# Patient Record
Sex: Female | Born: 1955
Health system: Southern US, Community
[De-identification: ages and names within clinical notes are randomized; demographics above are authoritative.]

## PROBLEM LIST (undated history)

## (undated) DIAGNOSIS — E119 Type 2 diabetes mellitus without complications: Secondary | ICD-10-CM

## (undated) DIAGNOSIS — I10 Essential (primary) hypertension: Secondary | ICD-10-CM

## (undated) DIAGNOSIS — I509 Heart failure, unspecified: Secondary | ICD-10-CM

## (undated) DIAGNOSIS — R011 Cardiac murmur, unspecified: Secondary | ICD-10-CM

## (undated) DIAGNOSIS — K5792 Diverticulitis of intestine, part unspecified, without perforation or abscess without bleeding: Secondary | ICD-10-CM

## (undated) DIAGNOSIS — I209 Angina pectoris, unspecified: Secondary | ICD-10-CM

## (undated) DIAGNOSIS — E785 Hyperlipidemia, unspecified: Secondary | ICD-10-CM

## (undated) DIAGNOSIS — E669 Obesity, unspecified: Secondary | ICD-10-CM

## (undated) DIAGNOSIS — J449 Chronic obstructive pulmonary disease, unspecified: Secondary | ICD-10-CM

## (undated) DIAGNOSIS — R0602 Shortness of breath: Secondary | ICD-10-CM

## (undated) DIAGNOSIS — I2510 Atherosclerotic heart disease of native coronary artery without angina pectoris: Secondary | ICD-10-CM

## (undated) DIAGNOSIS — J189 Pneumonia, unspecified organism: Secondary | ICD-10-CM

## (undated) HISTORY — PX: DILATION AND CURETTAGE OF UTERUS: SHX78

## (undated) HISTORY — PX: CORONARY ANGIOPLASTY WITH STENT PLACEMENT: SHX49

## (undated) HISTORY — PX: OPEN REDUCTION INTERNAL FIXATION (ORIF) TIBIA/FIBULA FRACTURE: SHX5992

## (undated) HISTORY — PX: DIAGNOSTIC LAPAROSCOPY: SUR761

## (undated) HISTORY — DX: Diverticulitis of intestine, part unspecified, without perforation or abscess without bleeding: K57.92

## (undated) HISTORY — PX: BOWEL RESECTION: SHX1257

## (undated) HISTORY — DX: Essential (primary) hypertension: I10

## (undated) HISTORY — PX: CHOLECYSTECTOMY: SHX55

## (undated) HISTORY — DX: Obesity, unspecified: E66.9

---

## 1978-04-27 HISTORY — PX: TUBAL LIGATION: SHX77

## 1988-12-26 HISTORY — PX: CHOLECYSTECTOMY: SHX55

## 1998-12-20 ENCOUNTER — Encounter: Payer: Self-pay | Admitting: Surgery

## 1998-12-26 ENCOUNTER — Observation Stay (HOSPITAL_COMMUNITY): Admission: RE | Admit: 1998-12-26 | Discharge: 1998-12-26 | Payer: Self-pay | Admitting: Surgery

## 1998-12-26 ENCOUNTER — Encounter (INDEPENDENT_AMBULATORY_CARE_PROVIDER_SITE_OTHER): Payer: Self-pay | Admitting: Specialist

## 2002-07-10 ENCOUNTER — Encounter: Payer: Self-pay | Admitting: Infectious Diseases

## 2002-07-10 ENCOUNTER — Ambulatory Visit (HOSPITAL_COMMUNITY): Admission: RE | Admit: 2002-07-10 | Discharge: 2002-07-10 | Payer: Self-pay | Admitting: Infectious Diseases

## 2003-04-06 ENCOUNTER — Emergency Department (HOSPITAL_COMMUNITY): Admission: EM | Admit: 2003-04-06 | Discharge: 2003-04-06 | Payer: Self-pay | Admitting: Emergency Medicine

## 2004-02-18 ENCOUNTER — Other Ambulatory Visit: Admission: RE | Admit: 2004-02-18 | Discharge: 2004-02-18 | Payer: Self-pay | Admitting: Obstetrics and Gynecology

## 2004-02-19 ENCOUNTER — Encounter: Admission: RE | Admit: 2004-02-19 | Discharge: 2004-02-19 | Payer: Self-pay | Admitting: Family Medicine

## 2004-09-23 ENCOUNTER — Encounter: Admission: RE | Admit: 2004-09-23 | Discharge: 2004-12-22 | Payer: Self-pay | Admitting: Family Medicine

## 2005-07-23 ENCOUNTER — Ambulatory Visit: Payer: Self-pay | Admitting: Physical Medicine & Rehabilitation

## 2005-07-23 ENCOUNTER — Inpatient Hospital Stay (HOSPITAL_COMMUNITY): Admission: AC | Admit: 2005-07-23 | Discharge: 2005-07-28 | Payer: Self-pay

## 2007-04-16 ENCOUNTER — Emergency Department (HOSPITAL_COMMUNITY): Admission: EM | Admit: 2007-04-16 | Discharge: 2007-04-16 | Payer: Self-pay | Admitting: Emergency Medicine

## 2008-02-21 ENCOUNTER — Encounter (INDEPENDENT_AMBULATORY_CARE_PROVIDER_SITE_OTHER): Payer: Self-pay | Admitting: Gastroenterology

## 2008-02-21 ENCOUNTER — Ambulatory Visit (HOSPITAL_COMMUNITY): Admission: RE | Admit: 2008-02-21 | Discharge: 2008-02-21 | Payer: Self-pay | Admitting: Gastroenterology

## 2008-04-27 HISTORY — PX: LAPAROSCOPIC GASTRIC BANDING: SHX1100

## 2009-07-17 ENCOUNTER — Other Ambulatory Visit: Admission: RE | Admit: 2009-07-17 | Discharge: 2009-07-17 | Payer: Self-pay | Admitting: Family Medicine

## 2009-07-22 ENCOUNTER — Encounter: Admission: RE | Admit: 2009-07-22 | Discharge: 2009-07-22 | Payer: Self-pay | Admitting: Family Medicine

## 2009-07-24 ENCOUNTER — Ambulatory Visit (HOSPITAL_COMMUNITY): Admission: RE | Admit: 2009-07-24 | Discharge: 2009-07-24 | Payer: Self-pay | Admitting: Surgery

## 2009-08-05 ENCOUNTER — Ambulatory Visit (HOSPITAL_COMMUNITY): Admission: RE | Admit: 2009-08-05 | Discharge: 2009-08-05 | Payer: Self-pay | Admitting: Surgery

## 2009-08-14 ENCOUNTER — Emergency Department (HOSPITAL_COMMUNITY): Admission: EM | Admit: 2009-08-14 | Discharge: 2009-08-14 | Payer: Self-pay | Admitting: Family Medicine

## 2009-09-10 ENCOUNTER — Encounter: Admission: RE | Admit: 2009-09-10 | Discharge: 2009-10-17 | Payer: Self-pay | Admitting: Surgery

## 2009-11-05 ENCOUNTER — Ambulatory Visit (HOSPITAL_COMMUNITY): Admission: RE | Admit: 2009-11-05 | Discharge: 2009-11-06 | Payer: Self-pay | Admitting: Surgery

## 2009-11-19 ENCOUNTER — Encounter: Admission: RE | Admit: 2009-11-19 | Discharge: 2010-01-23 | Payer: Self-pay | Admitting: Surgery

## 2010-01-16 ENCOUNTER — Encounter: Admission: RE | Admit: 2010-01-16 | Discharge: 2010-01-23 | Payer: Self-pay | Admitting: Surgery

## 2010-03-03 ENCOUNTER — Encounter
Admission: RE | Admit: 2010-03-03 | Discharge: 2010-03-03 | Payer: Self-pay | Source: Home / Self Care | Attending: Surgery | Admitting: Surgery

## 2010-06-03 ENCOUNTER — Encounter: Admit: 2010-06-03 | Payer: Self-pay | Admitting: Surgery

## 2010-06-03 ENCOUNTER — Encounter: Payer: Commercial Managed Care - PPO | Attending: Surgery | Admitting: *Deleted

## 2010-06-03 DIAGNOSIS — Z9884 Bariatric surgery status: Secondary | ICD-10-CM | POA: Insufficient documentation

## 2010-06-03 DIAGNOSIS — Z713 Dietary counseling and surveillance: Secondary | ICD-10-CM | POA: Insufficient documentation

## 2010-06-03 DIAGNOSIS — Z09 Encounter for follow-up examination after completed treatment for conditions other than malignant neoplasm: Secondary | ICD-10-CM | POA: Insufficient documentation

## 2010-07-13 LAB — GLUCOSE, CAPILLARY: Glucose-Capillary: 153 mg/dL — ABNORMAL HIGH (ref 70–99)

## 2010-07-13 LAB — COMPREHENSIVE METABOLIC PANEL
AST: 26 U/L (ref 0–37)
Alkaline Phosphatase: 91 U/L (ref 39–117)
Calcium: 9.6 mg/dL (ref 8.4–10.5)
Chloride: 100 mEq/L (ref 96–112)
GFR calc non Af Amer: >60 mL/min (ref >60)
Glucose, Bld: 134 mg/dL — ABNORMAL HIGH (ref 70–99)
Total Bilirubin: 0.5 mg/dL (ref 0.3–1.2)
Total Protein: 7.4 g/dL (ref 6.0–8.3)

## 2010-07-13 LAB — DIFFERENTIAL
Basophils Relative: 0 % (ref 0–1)
Basophils Relative: 1 % (ref 0–1)
Eosinophils Absolute: 0.2 10*3/uL (ref 0.0–0.7)
Lymphocytes Relative: 29 % (ref 12–46)
Lymphs Abs: 1.8 10*3/uL (ref 0.7–4.0)
Monocytes Absolute: 0.7 10*3/uL (ref 0.1–1.0)
Monocytes Absolute: 0.8 10*3/uL (ref 0.1–1.0)
Monocytes Relative: 5 % (ref 3–12)
Monocytes Relative: 7 % (ref 3–12)
Neutro Abs: 11.9 10*3/uL — ABNORMAL HIGH (ref 1.7–7.7)
Neutrophils Relative %: 82 % — ABNORMAL HIGH (ref 43–77)

## 2010-07-13 LAB — CBC
HCT: 37.7 % (ref 36.0–46.0)
Hemoglobin: 12.8 g/dL (ref 12.0–15.0)
RBC: 4.34 MIL/uL (ref 3.87–5.11)
RDW: 15.1 % (ref 11.5–15.5)
WBC: 10.3 10*3/uL (ref 4.0–10.5)

## 2010-07-13 LAB — SURGICAL PCR SCREEN: MRSA, PCR: NEGATIVE

## 2010-09-09 NOTE — Op Note (Signed)
Karina Oneal, Karina Oneal                  ACCOUNT NO.:  [id number]   MEDICAL RECORD NO.:  [id number]          PATIENT TYPE:  AMB   LOCATION:  ENDO                         FACILITY:  [facility]   PHYSICIAN:  Shirley Friar, MDDATE OF BIRTH:  20-Apr-1956   DATE OF PROCEDURE:  02/21/2008  DATE OF DISCHARGE:                               OPERATIVE REPORT   PROCEDURE:  Colonoscopy.   REASON FOR COLONOSCOPY:  Screening.   MEDICATIONS:  Phenergan 12.5 mg IV, fentanyl 75 mcg IV, Versed 6 mg IV.   FINDINGS:  Rectal exam was normal.  A pediatric Pentax colonoscope was  inserted to a fair prepped colon, advanced to the cecum where ileocecal  valve and appendiceal orifice were identified.  On careful withdrawal of  the colonoscope three small sessile polyps were seen.  Two polyps were  seen in transverse colon and one of these polyps was removed with snare  cautery.  It measured about for 4 mm in diameter.  A 2 mm diminutive  polyp was removed with cold snare technique and this diminutive polyp  was unable to be retrieved.  In the sigmoid colon a 3-mm sessile polyp  was removed with snare cautery.  There was scattered large and small  diverticula seen in the descending colon and sigmoid colon.  Retroflexion was unremarkable.   IMPRESSION:  1. Three small polyps status post snare removal (one removed with cold      technique, other two removed with hot technique - details stated      above) - diminutive polyp unable to be retrieved.  2. Left-sided diverticulosis.   PLAN:  1. Follow-up on path.  2. Avoid aspirin products x2 weeks.  3. High-fiber diet.      Shirley Friar, MD  Electronically Signed     VCS/MEDQ  D:  02/21/2008  T:  02/21/2008  Job:  [id number]   cc:   C. Duane Lope, M.D.  Fax: [phone]
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
## 2010-10-17 ENCOUNTER — Encounter (INDEPENDENT_AMBULATORY_CARE_PROVIDER_SITE_OTHER): Payer: Self-pay | Admitting: Surgery

## 2010-11-23 ENCOUNTER — Emergency Department (HOSPITAL_COMMUNITY)
Admission: EM | Admit: 2010-11-23 | Discharge: 2010-11-23 | Disposition: A | Discharge status: Home / Self Care | Payer: Commercial Managed Care - PPO | Attending: Emergency Medicine | Admitting: Emergency Medicine

## 2010-11-23 ENCOUNTER — Emergency Department (HOSPITAL_COMMUNITY): Payer: Commercial Managed Care - PPO

## 2010-11-23 DIAGNOSIS — Z9884 Bariatric surgery status: Secondary | ICD-10-CM | POA: Insufficient documentation

## 2010-11-23 DIAGNOSIS — R109 Unspecified abdominal pain: Secondary | ICD-10-CM | POA: Insufficient documentation

## 2010-11-23 DIAGNOSIS — E785 Hyperlipidemia, unspecified: Secondary | ICD-10-CM | POA: Insufficient documentation

## 2010-11-23 DIAGNOSIS — R197 Diarrhea, unspecified: Secondary | ICD-10-CM | POA: Insufficient documentation

## 2010-11-23 DIAGNOSIS — R110 Nausea: Secondary | ICD-10-CM | POA: Insufficient documentation

## 2010-11-23 DIAGNOSIS — Z9089 Acquired absence of other organs: Secondary | ICD-10-CM | POA: Insufficient documentation

## 2010-11-23 DIAGNOSIS — K5732 Diverticulitis of large intestine without perforation or abscess without bleeding: Secondary | ICD-10-CM | POA: Insufficient documentation

## 2010-11-23 DIAGNOSIS — E669 Obesity, unspecified: Secondary | ICD-10-CM | POA: Insufficient documentation

## 2010-11-23 DIAGNOSIS — K625 Hemorrhage of anus and rectum: Secondary | ICD-10-CM | POA: Insufficient documentation

## 2010-11-23 DIAGNOSIS — I10 Essential (primary) hypertension: Secondary | ICD-10-CM | POA: Insufficient documentation

## 2010-11-23 LAB — CBC
HCT: 46.7 % — ABNORMAL HIGH (ref 36.0–46.0)
Hemoglobin: 16.7 g/dL — ABNORMAL HIGH (ref 12.0–15.0)
MCH: 31.3 pg (ref 26.0–34.0)
MCV: 87.5 fL (ref 78.0–100.0)
RBC: 5.34 MIL/uL — ABNORMAL HIGH (ref 3.87–5.11)
WBC: 19.1 10*3/uL — ABNORMAL HIGH (ref 4.0–10.5)

## 2010-11-23 LAB — OCCULT BLOOD, POC DEVICE: Fecal Occult Bld: POSITIVE

## 2010-11-23 LAB — DIFFERENTIAL
Basophils Absolute: 0.1 10*3/uL (ref 0.0–0.1)
Basophils Relative: 0 % (ref 0–1)
Eosinophils Absolute: 0.1 10*3/uL (ref 0.0–0.7)
Eosinophils Relative: 1 % (ref 0–5)
Lymphocytes Relative: 13 % (ref 12–46)
Lymphs Abs: 2.5 10*3/uL (ref 0.7–4.0)
Monocytes Absolute: 1 10*3/uL (ref 0.1–1.0)
Monocytes Relative: 5 % (ref 3–12)
Neutro Abs: 15.5 10*3/uL — ABNORMAL HIGH (ref 1.7–7.7)
Neutrophils Relative %: 81 % — ABNORMAL HIGH (ref 43–77)

## 2010-11-23 LAB — COMPREHENSIVE METABOLIC PANEL WITH GFR
ALT: 35 U/L (ref 0–35)
AST: 18 U/L (ref 0–37)
Albumin: 3.8 g/dL (ref 3.5–5.2)
Alkaline Phosphatase: 154 U/L — ABNORMAL HIGH (ref 39–117)
BUN: 9 mg/dL (ref 6–23)
CO2: 27 meq/L (ref 19–32)
Calcium: 9.7 mg/dL (ref 8.4–10.5)
Chloride: 99 meq/L (ref 96–112)
Creatinine, Ser: 0.59 mg/dL (ref 0.50–1.10)
GFR calc Af Amer: >60 mL/min
GFR calc non Af Amer: >60 mL/min
Glucose, Bld: 233 mg/dL — ABNORMAL HIGH (ref 70–99)
Potassium: 3.2 meq/L — ABNORMAL LOW (ref 3.5–5.1)
Sodium: 139 meq/L (ref 135–145)
Total Bilirubin: 0.5 mg/dL (ref 0.3–1.2)
Total Protein: 7.5 g/dL (ref 6.0–8.3)

## 2010-11-23 MED ORDER — IOHEXOL 300 MG/ML  SOLN
100.0000 mL | Freq: Once | INTRAMUSCULAR | Status: AC | PRN
  Administered 2010-11-23: 100 mL via INTRAVENOUS

## 2010-12-05 ENCOUNTER — Encounter (INDEPENDENT_AMBULATORY_CARE_PROVIDER_SITE_OTHER): Payer: Self-pay

## 2011-01-30 LAB — I-STAT 8, (EC8 V) (CONVERTED LAB)
BUN: 7
Chloride: 102
HCT: 47 — ABNORMAL HIGH
Operator id: 196461
Potassium: 4
pCO2, Ven: 52.7 — ABNORMAL HIGH
pH, Ven: 7.398 — ABNORMAL HIGH

## 2011-01-30 LAB — POCT I-STAT CREATININE
Creatinine, Ser: 0.6
Operator id: 196461

## 2011-01-30 LAB — POCT CARDIAC MARKERS: CKMB, poc: <1 — ABNORMAL LOW

## 2011-05-07 ENCOUNTER — Encounter: Payer: Self-pay | Admitting: Internal Medicine

## 2011-05-08 ENCOUNTER — Ambulatory Visit (INDEPENDENT_AMBULATORY_CARE_PROVIDER_SITE_OTHER): Payer: Commercial Managed Care - PPO | Admitting: Internal Medicine

## 2011-05-08 ENCOUNTER — Encounter: Payer: Self-pay | Admitting: Internal Medicine

## 2011-05-08 DIAGNOSIS — E669 Obesity, unspecified: Secondary | ICD-10-CM

## 2011-05-08 DIAGNOSIS — Z720 Tobacco use: Secondary | ICD-10-CM

## 2011-05-08 DIAGNOSIS — I10 Essential (primary) hypertension: Secondary | ICD-10-CM

## 2011-05-08 DIAGNOSIS — R079 Chest pain, unspecified: Secondary | ICD-10-CM

## 2011-05-08 DIAGNOSIS — F17200 Nicotine dependence, unspecified, uncomplicated: Secondary | ICD-10-CM

## 2011-05-08 DIAGNOSIS — E785 Hyperlipidemia, unspecified: Secondary | ICD-10-CM

## 2011-05-08 NOTE — Progress Notes (Signed)
HPI Patient is a 56 yr old who was referred for evaluation of CP. Patinet has no history of CAD.  Does have a history of DM, HTN, dyslipidemia.  Seen by Dr. Tenny Craw on 1/9. Patient notes in November development of   neck, both arms.  Occurs with and without activity.  Multiple times per day.   Seems to come on with activity, stressed. Also has constant SSCP. No episodes of PND> Does have when lays down at night. Has lap band.  No reflux. Mother with CABG at 56 Has been borderline for DM.  Lap band AUgust 2011.  Had been on cholesterol lowering medicne.  Stopped when she developed blood in stool  Heard it had ground glass in it  Smokes 1/2 to 1 ppd. Quit in the past on Chantix.  Restarted.  Plans to go to counselling session.  Allergies  Allergen Reactions  . Ciprocin-Fluocin-Procin (Fluocinolone Acetonide)   . Sulfa Antibiotics     Current Outpatient Prescriptions  Medication Sig Dispense Refill  . amLODipine-benazepril (LOTREL) 5-20 MG per capsule Take 1 capsule by mouth daily.        Marland Kitchen aspirin 81 MG EC tablet Take 81 mg by mouth daily.        Marland Kitchen atenolol (TENORMIN) 100 MG tablet Take 100 mg by mouth daily.        . hydrochlorothiazide 25 MG tablet Take 25 mg by mouth daily.        . nitroGLYCERIN (NITROSTAT) 0.4 MG SL tablet Place 0.4 mg under the tongue every 5 (five) minutes as needed.      . sertraline (ZOLOFT) 50 MG tablet Take 50 mg by mouth daily.        Past Medical History  Diagnosis Date  . Wears glasses   . Diabetes mellitus, undetermined   . Hypertension     Past Surgical History  Procedure Date  . Cholecystectomy   . Tubal ligation   . Foot surgery     Family History  Problem Relation Age of Onset  . Asthma Father     History   Social History  . Marital Status: Divorced    Spouse Name: N/A    Number of Children: N/A  . Years of Education: N/A   Occupational History  . Not on file.   Social History Main Topics  . Smoking status: Current Everyday  Smoker    Types: Cigarettes  . Smokeless tobacco: Not on file  . Alcohol Use: Not on file  . Drug Use: Not on file  . Sexually Active: Not on file   Other Topics Concern  . Not on file   Social History Narrative  . No narrative on file    Review of Systems:  All systems reviewed.  They are negative to the above problem except as previously stated.  Vital Signs: BP 150/79  Pulse 56  Ht 5\' 5"  (1.651 m)  Wt 230 lb (104.327 kg)  BMI 38.27 kg/m2  Physical Exam Patient is in NAD.  HEENT:  Normocephalic, atraumatic. EOMI, PERRLA.  Neck: JVP is normal. No thyromegaly. No bruits.  Lungs: Some decreased airflow with mild wheeze.Marland Kitchen  Heart: Regular rate and rhythm. Normal S1, S2. No S3.   No significant murmurs. PMI not displaced.  Abdomen:  Supple, nontender. Normal bowel sounds. No masses. No hepatomegaly.Port for lap band on R.  Extremities:   Good distal pulses throughout. No lower extremity edema.  Musculoskeletal :moving all extremities.  Neuro:   alert and oriented  x3.  CN II-XII grossly intact.  EKG:  Sinus bradycardia.  56 bpm.  T wave inversion III   Assessment and Plan:

## 2011-05-08 NOTE — Assessment & Plan Note (Signed)
Patient has a constant sscp  I am not convinced this is cardiac. The intermitt neck and arm discomfort may represent angina but I am not completely convinced. With risks I would recomm a stress myoview to evaluate.

## 2011-05-08 NOTE — Assessment & Plan Note (Signed)
BP a little high.  I would not switch meds for now.  Will need to be followed.

## 2011-05-08 NOTE — Assessment & Plan Note (Signed)
COunselled on diet.

## 2011-05-08 NOTE — Assessment & Plan Note (Signed)
Will get records from Dr. Tenny Craw.  She should be on a statin.

## 2011-05-08 NOTE — Patient Instructions (Signed)
Your physician has requested that you have en exercise stress myoview. For further information please visit [email or web address]. Please follow instruction sheet, as given.   

## 2011-05-08 NOTE — Assessment & Plan Note (Signed)
Counselled.  Patient has quit in the past.  Exam with evidence of some COPD.

## 2011-05-20 ENCOUNTER — Ambulatory Visit (HOSPITAL_COMMUNITY): Payer: 59 | Attending: Cardiology | Admitting: Radiology

## 2011-05-20 DIAGNOSIS — E785 Hyperlipidemia, unspecified: Secondary | ICD-10-CM | POA: Insufficient documentation

## 2011-05-20 DIAGNOSIS — F17200 Nicotine dependence, unspecified, uncomplicated: Secondary | ICD-10-CM | POA: Insufficient documentation

## 2011-05-20 DIAGNOSIS — Z8249 Family history of ischemic heart disease and other diseases of the circulatory system: Secondary | ICD-10-CM | POA: Insufficient documentation

## 2011-05-20 DIAGNOSIS — R079 Chest pain, unspecified: Secondary | ICD-10-CM | POA: Insufficient documentation

## 2011-05-20 DIAGNOSIS — R5381 Other malaise: Secondary | ICD-10-CM | POA: Insufficient documentation

## 2011-05-20 DIAGNOSIS — E119 Type 2 diabetes mellitus without complications: Secondary | ICD-10-CM | POA: Insufficient documentation

## 2011-05-20 DIAGNOSIS — E669 Obesity, unspecified: Secondary | ICD-10-CM | POA: Insufficient documentation

## 2011-05-20 DIAGNOSIS — I10 Essential (primary) hypertension: Secondary | ICD-10-CM | POA: Insufficient documentation

## 2011-05-20 DIAGNOSIS — R000 Tachycardia, unspecified: Secondary | ICD-10-CM | POA: Insufficient documentation

## 2011-05-20 DIAGNOSIS — R0602 Shortness of breath: Secondary | ICD-10-CM | POA: Insufficient documentation

## 2011-05-20 MED ORDER — TECHNETIUM TC 99M TETROFOSMIN IV KIT
30.0000 | PACK | Freq: Once | INTRAVENOUS | Status: AC | PRN
  Administered 2011-05-20: 30 via INTRAVENOUS

## 2011-05-20 MED ORDER — REGADENOSON 0.4 MG/5ML IV SOLN
0.4000 mg | Freq: Once | INTRAVENOUS | Status: AC
  Administered 2011-05-20: 0.4 mg via INTRAVENOUS

## 2011-05-20 MED ORDER — TECHNETIUM TC 99M TETROFOSMIN IV KIT
10.0000 | PACK | Freq: Once | INTRAVENOUS | Status: AC | PRN
  Administered 2011-05-20: 10 via INTRAVENOUS

## 2011-05-20 NOTE — Progress Notes (Signed)
[facility] SITE 3 NUCLEAR MED [address] [place] [address] [phone]  Cardiology Nuclear Med Study  Karina Oneal is a 56 y.o. female [id number] 1955-12-14   Nuclear Med Background Indication for Stress Test:  Evaluation for Ischemia History:  No previous documented CAD Cardiac Risk Factors: Family History - CAD, Hypertension, Lipids, NIDDM, Obesity and Smoker  Symptoms:  Chest Pressure with and without Exertion (last episode of chest discomfort was this am in the parking lot, 6/10, none now), Fatigue, Rapid HR and SOB   Nuclear Pre-Procedure Caffeine/Decaff Intake:  7:00pm NPO After: 1:00am   Lungs:  Clear.  O2 SAT 95% on RA IV 0.9% NS with Angio Cath:  22g  IV Site: R Forearm x 1, tolerated well IV Started by:  Irean Hong, RN  Chest Size (in):  42 Cup Size: D  Height: 5\' 5"  (1.651 m)  Weight:  227 lb (102.967 kg)  BMI:  Body mass index is 37.77 kg/(m^2). Tech Comments:  Atenolol held x 24 hours    Nuclear Med Study 1 or 2 day study: 1 day  Stress Test Type:  Treadmill/Lexiscan  Reading MD: Marca Ancona, MD  Order Authorizing Provider:  Dietrich Pates, MD  Resting Radionuclide: Technetium 1m Tetrofosmin  Resting Radionuclide Dose: 11.0 mCi   Stress Radionuclide:  Technetium 65m Tetrofosmin  Stress Radionuclide Dose: 33.0 mCi           Stress Protocol Rest HR: 51 Stress HR: 96  Rest BP: Sitting 127/62  Standing 129/63 Stress BP: 172/75  Exercise Time (min): 2:00 METS: n/a   Predicted Max HR: 166 bpm % Max HR: 57.83 bpm Rate Pressure Product: 52841   Dose of Adenosine (mg):  n/a Dose of Lexiscan: 0.4 mg  Dose of Atropine (mg): n/a Dose of Dobutamine: n/a mcg/kg/min (at max HR)  Stress Test Technologist: Smiley Houseman, CMA-N  Nuclear Technologist:  Domenic Polite, CNMT     Rest Procedure:  Myocardial perfusion imaging was performed at rest 45 minutes following the intravenous administration of Technetium 58m Tetrofosmin.  Rest ECG:  Nonspecific ST-T wave changes.  Stress Procedure:  The patient received IV Lexiscan 0.4 mg over 15-seconds with concurrent low level exercise and then Technetium 63m Tetrofosmin was injected at 30-seconds while the patient continued walking one more minute.  There were no significant changes with Lexiscan.  She did c/o chest pain with Lexiscan.  Quantitative spect images were obtained after a 45-minute delay.    Patient had a mild hypertensive response in recovery due to BP cuff pain, per patient.  Stress ECG: No significant change from baseline ECG  QPS Raw Data Images:  Normal; no motion artifact; normal heart/lung ratio. Stress Images:  Basal to mid inferior perfusion defect, moderate in intensity.  Rest Images:  Normal homogeneous uptake in all areas of the myocardium. Subtraction (SDS):  Reversible basal to mid inferior perfusion defect.  Transient Ischemic Dilatation (Normal <1.22):  1.04 Lung/Heart Ratio (Normal <0.45):  0.35  Quantitative Gated Spect Images QGS EDV:  96 ml QGS ESV:  33 ml QGS cine images:  NL LV Function; NL Wall Motion QGS EF: 66%  Impression Exercise Capacity:  Lexiscan with low level exercise. BP Response:  Hypertensive blood pressure response. Clinical Symptoms:  Chest pain.  ECG Impression:  No significant ST segment change suggestive of ischemia. Comparison with Prior Nuclear Study: No previous nuclear study performed  Overall Impression:  There is a reversible basal to mid inferior perfusion defect with normal  wall motion and EF.  This does suggest ischemia.   Declyn Offield [employer]

## 2011-05-22 ENCOUNTER — Other Ambulatory Visit: Payer: Self-pay | Admitting: *Deleted

## 2011-05-22 DIAGNOSIS — R9439 Abnormal result of other cardiovascular function study: Secondary | ICD-10-CM

## 2011-05-25 ENCOUNTER — Other Ambulatory Visit (INDEPENDENT_AMBULATORY_CARE_PROVIDER_SITE_OTHER): Payer: Commercial Managed Care - PPO | Admitting: *Deleted

## 2011-05-25 DIAGNOSIS — R9439 Abnormal result of other cardiovascular function study: Secondary | ICD-10-CM

## 2011-05-25 DIAGNOSIS — R079 Chest pain, unspecified: Secondary | ICD-10-CM

## 2011-05-25 LAB — BASIC METABOLIC PANEL
CO2: 29 mEq/L (ref 19–32)
Chloride: 101 mEq/L (ref 96–112)
Glucose, Bld: 179 mg/dL — ABNORMAL HIGH (ref 70–99)
Potassium: 3.6 mEq/L (ref 3.5–5.1)
Sodium: 139 mEq/L (ref 135–145)

## 2011-05-25 LAB — CBC WITH DIFFERENTIAL/PLATELET
Basophils Relative: 0.4 % (ref 0.0–3.0)
Eosinophils Relative: 2 % (ref 0.0–5.0)
HCT: 42.7 % (ref 36.0–46.0)
Hemoglobin: 14.4 g/dL (ref 12.0–15.0)
Lymphs Abs: 2.5 10*3/uL (ref 0.7–4.0)
MCV: 91.5 fl (ref 78.0–100.0)
Monocytes Absolute: 0.7 10*3/uL (ref 0.1–1.0)
RBC: 4.67 Mil/uL (ref 3.87–5.11)
WBC: 12.4 10*3/uL — ABNORMAL HIGH (ref 4.5–10.5)

## 2011-05-26 ENCOUNTER — Encounter (HOSPITAL_BASED_OUTPATIENT_CLINIC_OR_DEPARTMENT_OTHER)
Admission: RE | Disposition: A | Discharge status: Hospice | Payer: Self-pay | Source: Ambulatory Visit | Attending: Cardiology

## 2011-05-26 ENCOUNTER — Other Ambulatory Visit: Payer: Self-pay

## 2011-05-26 ENCOUNTER — Inpatient Hospital Stay (HOSPITAL_BASED_OUTPATIENT_CLINIC_OR_DEPARTMENT_OTHER)
Admission: RE | Admit: 2011-05-26 | Discharge: 2011-05-26 | Disposition: A | Discharge status: Hospice | Payer: 59 | Source: Ambulatory Visit | Attending: Cardiology | Admitting: Cardiology

## 2011-05-26 ENCOUNTER — Encounter (HOSPITAL_COMMUNITY)
Admission: RE | Disposition: A | Discharge status: Home / Self Care | Payer: Self-pay | Source: Ambulatory Visit | Attending: Cardiology

## 2011-05-26 ENCOUNTER — Ambulatory Visit (HOSPITAL_COMMUNITY)
Admission: RE | Admit: 2011-05-26 | Discharge: 2011-05-27 | Disposition: A | Discharge status: Home / Self Care | Payer: 59 | Source: Ambulatory Visit | Attending: Cardiology | Admitting: Cardiology

## 2011-05-26 ENCOUNTER — Encounter (HOSPITAL_COMMUNITY): Payer: Self-pay | Admitting: Cardiology

## 2011-05-26 DIAGNOSIS — I2510 Atherosclerotic heart disease of native coronary artery without angina pectoris: Secondary | ICD-10-CM | POA: Insufficient documentation

## 2011-05-26 DIAGNOSIS — F17200 Nicotine dependence, unspecified, uncomplicated: Secondary | ICD-10-CM | POA: Insufficient documentation

## 2011-05-26 DIAGNOSIS — E785 Hyperlipidemia, unspecified: Secondary | ICD-10-CM | POA: Insufficient documentation

## 2011-05-26 DIAGNOSIS — E119 Type 2 diabetes mellitus without complications: Secondary | ICD-10-CM | POA: Insufficient documentation

## 2011-05-26 DIAGNOSIS — Z9884 Bariatric surgery status: Secondary | ICD-10-CM | POA: Insufficient documentation

## 2011-05-26 DIAGNOSIS — Z79899 Other long term (current) drug therapy: Secondary | ICD-10-CM | POA: Insufficient documentation

## 2011-05-26 DIAGNOSIS — Z7982 Long term (current) use of aspirin: Secondary | ICD-10-CM | POA: Insufficient documentation

## 2011-05-26 DIAGNOSIS — I10 Essential (primary) hypertension: Secondary | ICD-10-CM | POA: Insufficient documentation

## 2011-05-26 DIAGNOSIS — R9439 Abnormal result of other cardiovascular function study: Secondary | ICD-10-CM | POA: Insufficient documentation

## 2011-05-26 DIAGNOSIS — R079 Chest pain, unspecified: Secondary | ICD-10-CM | POA: Insufficient documentation

## 2011-05-26 HISTORY — DX: Cardiac murmur, unspecified: R01.1

## 2011-05-26 HISTORY — DX: Shortness of breath: R06.02

## 2011-05-26 HISTORY — DX: Atherosclerotic heart disease of native coronary artery without angina pectoris: I25.10

## 2011-05-26 HISTORY — DX: Hyperlipidemia, unspecified: E78.5

## 2011-05-26 HISTORY — PX: PERCUTANEOUS CORONARY STENT INTERVENTION (PCI-S): SHX5485

## 2011-05-26 LAB — POCT I-STAT GLUCOSE: Glucose, Bld: 133 mg/dL — ABNORMAL HIGH (ref 70–99)

## 2011-05-26 SURGERY — JV LEFT HEART CATHETERIZATION WITH CORONARY ANGIOGRAM
Anesthesia: Moderate Sedation

## 2011-05-26 SURGERY — PERCUTANEOUS CORONARY STENT INTERVENTION (PCI-S)
Anesthesia: LOCAL

## 2011-05-26 MED ORDER — ATENOLOL 100 MG PO TABS
100.0000 mg | ORAL_TABLET | Freq: Every day | ORAL | Status: DC
  Filled 2011-05-26: qty 1

## 2011-05-26 MED ORDER — SODIUM CHLORIDE 0.9 % IV SOLN: 250.0000 mL | INTRAVENOUS | Status: DC | PRN

## 2011-05-26 MED ORDER — BENAZEPRIL HCL 20 MG PO TABS
20.0000 mg | ORAL_TABLET | Freq: Every day | ORAL | Status: DC
  Filled 2011-05-26: qty 1

## 2011-05-26 MED ORDER — SODIUM CHLORIDE 0.9 % IJ SOLN: 3.0000 mL | INTRAMUSCULAR | Status: DC | PRN

## 2011-05-26 MED ORDER — BIVALIRUDIN 250 MG IV SOLR
INTRAVENOUS | Status: AC
  Filled 2011-05-26: qty 250

## 2011-05-26 MED ORDER — AMLODIPINE BESYLATE 5 MG PO TABS
5.0000 mg | ORAL_TABLET | Freq: Every day | ORAL | Status: DC
  Filled 2011-05-26: qty 1

## 2011-05-26 MED ORDER — FENTANYL CITRATE 0.05 MG/ML IJ SOLN
INTRAMUSCULAR | Status: AC
  Filled 2011-05-26: qty 2

## 2011-05-26 MED ORDER — ASPIRIN 81 MG PO CHEW: 81.0000 mg | CHEWABLE_TABLET | Freq: Every day | ORAL | Status: DC

## 2011-05-26 MED ORDER — HEPARIN BOLUS VIA INFUSION
3000.0000 [IU] | Freq: Once | INTRAVENOUS | Status: AC
  Administered 2011-05-26: 3000 [IU] via INTRAVENOUS

## 2011-05-26 MED ORDER — HEPARIN (PORCINE) IN NACL 2-0.9 UNIT/ML-% IJ SOLN
INTRAMUSCULAR | Status: AC
  Filled 2011-05-26: qty 2000

## 2011-05-26 MED ORDER — NITROGLYCERIN 0.4 MG SL SUBL: 0.4000 mg | SUBLINGUAL_TABLET | SUBLINGUAL | Status: DC | PRN

## 2011-05-26 MED ORDER — MIDAZOLAM HCL 2 MG/2ML IJ SOLN
INTRAMUSCULAR | Status: AC
  Filled 2011-05-26: qty 2

## 2011-05-26 MED ORDER — HYDROCHLOROTHIAZIDE 25 MG PO TABS
25.0000 mg | ORAL_TABLET | Freq: Every day | ORAL | Status: DC
  Filled 2011-05-26: qty 1

## 2011-05-26 MED ORDER — SODIUM CHLORIDE 0.9 % IV SOLN: INTRAVENOUS | Status: DC

## 2011-05-26 MED ORDER — CLOPIDOGREL BISULFATE 300 MG PO TABS
600.0000 mg | ORAL_TABLET | ORAL | Status: AC
  Administered 2011-05-26: 600 mg via ORAL

## 2011-05-26 MED ORDER — LIDOCAINE HCL (PF) 1 % IJ SOLN
INTRAMUSCULAR | Status: AC
  Filled 2011-05-26: qty 30

## 2011-05-26 MED ORDER — SODIUM CHLORIDE 0.9 % IV SOLN: INTRAVENOUS | Status: AC

## 2011-05-26 MED ORDER — ASPIRIN 81 MG PO CHEW
324.0000 mg | CHEWABLE_TABLET | ORAL | Status: AC
  Administered 2011-05-26: 324 mg via ORAL

## 2011-05-26 MED ORDER — SERTRALINE HCL 50 MG PO TABS
50.0000 mg | ORAL_TABLET | Freq: Every day | ORAL | Status: DC
  Filled 2011-05-26: qty 1

## 2011-05-26 MED ORDER — CLOPIDOGREL BISULFATE 75 MG PO TABS
75.0000 mg | ORAL_TABLET | Freq: Every day | ORAL | Status: DC
  Administered 2011-05-27: 75 mg via ORAL
  Filled 2011-05-26: qty 1

## 2011-05-26 MED ORDER — SODIUM CHLORIDE 0.9 % IV SOLN
250.0000 mL | INTRAVENOUS | Status: DC | PRN
  Administered 2011-05-26: 250 mL via INTRAVENOUS

## 2011-05-26 MED ORDER — SODIUM CHLORIDE 0.9 % IJ SOLN: 3.0000 mL | Freq: Two times a day (BID) | INTRAMUSCULAR | Status: DC

## 2011-05-26 MED ORDER — ASPIRIN EC 81 MG PO TBEC
81.0000 mg | DELAYED_RELEASE_TABLET | Freq: Every day | ORAL | Status: DC
  Filled 2011-05-26: qty 1

## 2011-05-26 MED ORDER — ONDANSETRON HCL 4 MG/2ML IJ SOLN: 4.0000 mg | Freq: Four times a day (QID) | INTRAMUSCULAR | Status: DC | PRN

## 2011-05-26 MED ORDER — ACETAMINOPHEN 325 MG PO TABS: 650.0000 mg | ORAL_TABLET | ORAL | Status: DC | PRN

## 2011-05-26 MED ORDER — ASPIRIN 81 MG PO CHEW: 324.0000 mg | CHEWABLE_TABLET | ORAL | Status: DC

## 2011-05-26 NOTE — H&P (View-Only) (Signed)
HPI Patient is a 56 yr old who was referred for evaluation of CP. Patinet has no history of CAD.  Does have a history of DM, HTN, dyslipidemia.  Seen by Dr. Chadrick Sprinkle on 1/9. Patient notes in November development of   neck, both arms.  Occurs with and without activity.  Multiple times per day.   Seems to come on with activity, stressed. Also has constant SSCP. No episodes of PND> Does have when lays down at night. Has lap band.  No reflux. Mother with CABG at 60 Has been borderline for DM.  Lap band AUgust 2011.  Had been on cholesterol lowering medicne.  Stopped when she developed blood in stool  Heard it had ground glass in it  Smokes 1/2 to 1 ppd. Quit in the past on Chantix.  Restarted.  Plans to go to counselling session.  Allergies  Allergen Reactions  . Ciprocin-Fluocin-Procin (Fluocinolone Acetonide)   . Sulfa Antibiotics     Current Outpatient Prescriptions  Medication Sig Dispense Refill  . amLODipine-benazepril (LOTREL) 5-20 MG per capsule Take 1 capsule by mouth daily.        . aspirin 81 MG EC tablet Take 81 mg by mouth daily.        . atenolol (TENORMIN) 100 MG tablet Take 100 mg by mouth daily.        . hydrochlorothiazide 25 MG tablet Take 25 mg by mouth daily.        . nitroGLYCERIN (NITROSTAT) 0.4 MG SL tablet Place 0.4 mg under the tongue every 5 (five) minutes as needed.      . sertraline (ZOLOFT) 50 MG tablet Take 50 mg by mouth daily.        Past Medical History  Diagnosis Date  . Wears glasses   . Diabetes mellitus, undetermined   . Hypertension     Past Surgical History  Procedure Date  . Cholecystectomy   . Tubal ligation   . Foot surgery     Family History  Problem Relation Age of Onset  . Asthma Father     History   Social History  . Marital Status: Divorced    Spouse Name: N/A    Number of Children: N/A  . Years of Education: N/A   Occupational History  . Not on file.   Social History Main Topics  . Smoking status: Current Everyday  Smoker    Types: Cigarettes  . Smokeless tobacco: Not on file  . Alcohol Use: Not on file  . Drug Use: Not on file  . Sexually Active: Not on file   Other Topics Concern  . Not on file   Social History Narrative  . No narrative on file    Review of Systems:  All systems reviewed.  They are negative to the above problem except as previously stated.  Vital Signs: BP 150/79  Pulse 56  Ht 5' 5" (1.651 m)  Wt 230 lb (104.327 kg)  BMI 38.27 kg/m2  Physical Exam Patient is in NAD.  HEENT:  Normocephalic, atraumatic. EOMI, PERRLA.  Neck: JVP is normal. No thyromegaly. No bruits.  Lungs: Some decreased airflow with mild wheeze..  Heart: Regular rate and rhythm. Normal S1, S2. No S3.   No significant murmurs. PMI not displaced.  Abdomen:  Supple, nontender. Normal bowel sounds. No masses. No hepatomegaly.Port for lap band on R.  Extremities:   Good distal pulses throughout. No lower extremity edema.  Musculoskeletal :moving all extremities.  Neuro:   alert and oriented   x3.  CN II-XII grossly intact.  EKG:  Sinus bradycardia.  56 bpm.  T wave inversion III   Assessment and Plan:    

## 2011-05-26 NOTE — Interval H&P Note (Signed)
History and Physical Interval Note:  05/26/2011 2:07 PM  The patient was seen in consultation by Dr. Tenny Craw.  See her assessment and plan in NOTES, but not on H and P document.  She underwent nuclear imaging due to symptoms since November, and this revealed an inferior defect.  The patient was set up for cath and this demonstrated a critical RCA lesion with possible thrombus.  She is not an ACS, and symptoms are exertional.  Dr. Antoine Poche scheduled the patient for PCI.  I reviewed all of the data, and discussed the risks and benefits with the patient in detail.  She made we aware of some minor GI bleed dur to diverticulosis, but her Hgb has been normal.  She has had prior endoscopy.  She wished to proceed.  I spoke with the patient and her daughter in detail.    Karina Oneal  has presented today for surgery, with the diagnosis of blockage  The various methods of treatment have been discussed with the patient and family. After consideration of risks, benefits and other options for treatment, the patient has consented to  Procedure(s): PERCUTANEOUS CORONARY STENT INTERVENTION (PCI-S) as a surgical intervention .  The patients' history has been reviewed, patient examined, no change in status, stable for surgery.  I have reviewed the patients' chart and labs.  Questions were answered to the patient's satisfaction.     [name]

## 2011-05-26 NOTE — Op Note (Signed)
CARDIAC CATH NOTE  Name: Karina Oneal MRN: [id number] DOB: 1955-05-12  Procedure: PTCA and stenting of the RCA  Indication: exertional angina with positive stress test  Procedural Details: The right groin was prepped, draped, and anesthetized with 1% lidocaine.  A 6 Fr sheath was introduced into the right femoral artery in exchange for a prior 59F sheath using double glove technique.  Weight-based bivalirudin was given for anticoagulation. Once a therapeutic ACT was achieved, a 6 [facility] guide catheter was inserted.  A prowater coronary guidewire was used to cross the lesion.  The lesion was predilated with a 2.43mm compliant balloon balloon.  The lesion was then stented with a 3.5 by 16mm Promus element  stent.  The stent was postdilated with a 3.67mm noncompliant balloon.  Following PCI, there was 0% residual stenosis and TIMI-3 flow. Final angiography confirmed an excellent result.  Pre and post IVUS was used for vessel sizing, stent length, and post implantation documentation of appropriate stent deployment and expansion.   The patient tolerated the procedure well. There were no immediate procedural complications. Femoral sheath was secured into place. The patient was transferred to the post catheterization recovery area for further monitoring.  Lesion Data: Vessel: Proximal mid RCA Percent stenosis (pre): 99% with Sianos grade ii thrombus TIMI-flow (pre):  2-3 Stent:  16 by 3.5 mm Promus Element stent  Percent stenosis (post): 0 TIMI-flow (post): 3  The angio revealed a high grade focal lesion with filling defect in the junction of the proximal and mid vessel.  The vessel was stented without complication with an excellent angio result.  The distal vessel was unchanged.  IVUS before intervention revealed a lesion length of 11.8 mm, and reference vessel diameter of between 3.5-4.56mm.  Post stent implant revealed an adequately deployed stent without obvious edge tear of significance.  There  was good stent expansion, and no appreciable stent malapposition.    Conclusions:  1.  Successful PCI of the RCA using IVUS guided DES implantation.  Recommendations:  1.  DAPT for one year, or appopriate adjustment of DAPT based on clinical follow up.    [name], MD, [facility], [place]  3:51 PM 05/26/2011  05/26/2011, 3:43 PM

## 2011-05-26 NOTE — Progress Notes (Signed)
Site area: right groin  Site Prior to Removal:  Level 0  Pressure Applied For 20 MINUTES    Minutes Beginning at 1745  Manual:   yes  Patient Status During Pull:  aao x3  Post Pull Groin Site:  Level 0  Post Pull Instructions Given:  yes  Post Pull Pulses Present:  yes  Dressing Applied:  yes  Comments:  Tolerated procedure well , sheath pulled by Donna Christen RN

## 2011-05-26 NOTE — H&P (View-Only) (Signed)
HPI Patient is a 55 yr old who was referred for evaluation of CP. Patinet has no history of CAD.  Does have a history of DM, HTN, dyslipidemia.  Seen by Dr. Zakeya Junker on 1/9. Patient notes in November development of   neck, both arms.  Occurs with and without activity.  Multiple times per day.   Seems to come on with activity, stressed. Also has constant SSCP. No episodes of PND> Does have when lays down at night. Has lap band.  No reflux. Mother with CABG at 60 Has been borderline for DM.  Lap band AUgust 2011.  Had been on cholesterol lowering medicne.  Stopped when she developed blood in stool  Heard it had ground glass in it  Smokes 1/2 to 1 ppd. Quit in the past on Chantix.  Restarted.  Plans to go to counselling session.  Allergies  Allergen Reactions  . Ciprocin-Fluocin-Procin (Fluocinolone Acetonide)   . Sulfa Antibiotics     Current Outpatient Prescriptions  Medication Sig Dispense Refill  . amLODipine-benazepril (LOTREL) 5-20 MG per capsule Take 1 capsule by mouth daily.        . aspirin 81 MG EC tablet Take 81 mg by mouth daily.        . atenolol (TENORMIN) 100 MG tablet Take 100 mg by mouth daily.        . hydrochlorothiazide 25 MG tablet Take 25 mg by mouth daily.        . nitroGLYCERIN (NITROSTAT) 0.4 MG SL tablet Place 0.4 mg under the tongue every 5 (five) minutes as needed.      . sertraline (ZOLOFT) 50 MG tablet Take 50 mg by mouth daily.        Past Medical History  Diagnosis Date  . Wears glasses   . Diabetes mellitus, undetermined   . Hypertension     Past Surgical History  Procedure Date  . Cholecystectomy   . Tubal ligation   . Foot surgery     Family History  Problem Relation Age of Onset  . Asthma Father     History   Social History  . Marital Status: Divorced    Spouse Name: N/A    Number of Children: N/A  . Years of Education: N/A   Occupational History  . Not on file.   Social History Main Topics  . Smoking status: Current Everyday  Smoker    Types: Cigarettes  . Smokeless tobacco: Not on file  . Alcohol Use: Not on file  . Drug Use: Not on file  . Sexually Active: Not on file   Other Topics Concern  . Not on file   Social History Narrative  . No narrative on file    Review of Systems:  All systems reviewed.  They are negative to the above problem except as previously stated.  Vital Signs: BP 150/79  Pulse 56  Ht 5' 5" (1.651 m)  Wt 230 lb (104.327 kg)  BMI 38.27 kg/m2  Physical Exam Patient is in NAD.  HEENT:  Normocephalic, atraumatic. EOMI, PERRLA.  Neck: JVP is normal. No thyromegaly. No bruits.  Lungs: Some decreased airflow with mild wheeze..  Heart: Regular rate and rhythm. Normal S1, S2. No S3.   No significant murmurs. PMI not displaced.  Abdomen:  Supple, nontender. Normal bowel sounds. No masses. No hepatomegaly.Port for lap band on R.  Extremities:   Good distal pulses throughout. No lower extremity edema.  Musculoskeletal :moving all extremities.  Neuro:   alert and oriented   x3.  CN II-XII grossly intact.  EKG:  Sinus bradycardia.  56 bpm.  T wave inversion III   Assessment and Plan:    

## 2011-05-26 NOTE — Interval H&P Note (Signed)
History and Physical Interval Note:  05/26/2011 11:10 AM  Karina Oneal  has presented today for surgery, with the diagnosis of      pos Stress test  The various methods of treatment have been discussed with the patient and family. After consideration of risks, benefits and other options for treatment, the patient has consented to  Procedure(s): JV LEFT HEART CATHETERIZATION WITH CORONARY ANGIOGRAM as a surgical intervention .  The patients' history has been reviewed, patient examined, no change in status, stable for surgery.  I have reviewed the patients' chart and labs.  Questions were answered to the patient's satisfaction.    Note:  Stress perfusion study demonstrated There is a reversible basal to mid inferior perfusion defect with normal wall motion and EF. This does suggest ischemia.    Rollene Rotunda

## 2011-05-26 NOTE — Progress Notes (Signed)
Attempted IV insertion x 3, unable to get IV.

## 2011-05-26 NOTE — Procedures (Signed)
  Cardiac Catheterization Procedure Note  Name: Karina Oneal MRN: [id number] DOB: June 01, 1955  Procedure: Left Heart Cath, Selective Coronary Angiography, LV angiography  Indication:  Chest pain.  Abnormal stress test with a reversible basal to mid inferior perfusion defect with normal wall motion and EF. This does suggest ischemia.    Procedural details: The right groin was prepped, draped, and anesthetized with 1% lidocaine. Using modified Seldinger technique, a 5 French sheath was introduced into the right femoral artery. Standard Judkins catheters were used for coronary angiography and left ventriculography. Catheter exchanges were performed over a guidewire. There were no immediate procedural complications. The patient was transferred to the post catheterization recovery area for further monitoring.  Procedural Findings:  Hemodynamics:     AO 150/20     LV  156/68   Coronary angiography:  Coronary dominance: Right  Left mainstem:   Normal  Left anterior descending (LAD):   Ostial 25%. Proximal mid calcification. Long mid 40-50% stenosis. Diffuse mild mid and distal luminal irregularities. The first diagonal is very small with ostial 50% stenosis. Second diagonal is moderate sized and normal.  Left circumflex (LCx):  AV groove normal. There is a very large mid obtuse marginal which is normal.  Right coronary artery (RCA):  Proximal 99% stenosis. Mid long 25% stenosis. Distal luminal irregularities. PDA is moderate sized and normal.  Left ventriculography: Left ventricular systolic function is normal, LVEF is estimated at 55-65%, there is no significant mitral regurgitation   Final Conclusions:  Severe single-vessel coronary disease.  Recommendations: Plan PCI of RCA  Rollene Rotunda 05/26/2011, 11:36 AM

## 2011-05-26 NOTE — Progress Notes (Signed)
Site area: right groin  Site Prior to Removal:  Level 0  Pressure Applied For 20 MINUTES    Minutes Beginning at 1745  Manual:   yes  Patient Status During Pull:  stable  Post Pull Groin Site:  Level 0  Post Pull Instructions Given:  yes  Post Pull Pulses Present:  yes  Dressing Applied:  yes  Comments:    

## 2011-05-27 ENCOUNTER — Other Ambulatory Visit: Payer: Self-pay

## 2011-05-27 ENCOUNTER — Encounter (HOSPITAL_COMMUNITY): Payer: Self-pay | Admitting: Physician Assistant

## 2011-05-27 DIAGNOSIS — R079 Chest pain, unspecified: Secondary | ICD-10-CM

## 2011-05-27 LAB — HEPATIC FUNCTION PANEL
ALT: 21 U/L (ref 0–35)
AST: 20 U/L (ref 0–37)
Albumin: 3.4 g/dL — ABNORMAL LOW (ref 3.5–5.2)
Bilirubin, Direct: <0.1 mg/dL (ref 0.0–0.3)
Total Bilirubin: 0.3 mg/dL (ref 0.3–1.2)

## 2011-05-27 LAB — CBC
HCT: 41.4 % (ref 36.0–46.0)
Hemoglobin: 14.7 g/dL (ref 12.0–15.0)
MCV: 88.1 fL (ref 78.0–100.0)
RBC: 4.7 MIL/uL (ref 3.87–5.11)
RDW: 13 % (ref 11.5–15.5)
WBC: 10.8 10*3/uL — ABNORMAL HIGH (ref 4.0–10.5)

## 2011-05-27 LAB — BASIC METABOLIC PANEL
BUN: 8 mg/dL (ref 6–23)
CO2: 28 mEq/L (ref 19–32)
Chloride: 106 mEq/L (ref 96–112)
Creatinine, Ser: 0.45 mg/dL — ABNORMAL LOW (ref 0.50–1.10)
GFR calc Af Amer: >90 mL/min (ref >90)
Potassium: 3.7 mEq/L (ref 3.5–5.1)

## 2011-05-27 MED ORDER — ATORVASTATIN CALCIUM 20 MG PO TABS: 20.0000 mg | ORAL_TABLET | Freq: Every day | ORAL | Status: DC

## 2011-05-27 MED ORDER — CLOPIDOGREL BISULFATE 75 MG PO TABS: 75.0000 mg | ORAL_TABLET | Freq: Every day | ORAL | Status: DC

## 2011-05-27 NOTE — Discharge Summary (Signed)
Discharge Summary   Patient ID: Karina Oneal MRN: [id number], DOB/AGE: March 01, 1956 56 y.o. Admit date: 05/26/2011 D/C date:     05/27/2011   Primary Discharge Diagnoses:  1. Newly diagnosed CAD - s/p IVUS-guided DES to RCA 05/26/11 following abnormal stress test - normal LV function by cath this admission  Secondary Discharge Diagnoses:  1. DM 2. HTN 3. Dyslipidemia  - initiated on Lipitor this admission - our office will call to set up FLP/CMET for 6 weeks 4. Lap band surgery August 2011 5. Tobacco abuse  Hospital Course: 56 y/o F with hx DM, HTN, dyslipidemia presented to Dr. Charlott Rakes office for evaluation of chest pain. It has occurred with and without activity but seemed to come on with activity and stress. EKG showed SB 56bpm with TWI III. Dr. Tenny Craw recommended stress Celine Ahr which was done 05/20/11 demonstrating reversible basal to mid inferior perfusion defect with normal wall motion and EF, which was felt to suggest ischemia. Cardiac cath was recommended and was performed 05/26/11. Dr. Antoine Poche noted severe single vessel CAD in the RCA, and Dr. Riley Kill performed successful PCI of the RCA using IVUS guided DES implantation. He recommended DAPT for one year, or appopriate adjustment of DAPT based on clinical follow up. The patient tolerated the procedure well. She agreed to start Lipitor 20mg  daily. She ambulated with cardiac rehab without complication. Dr. Antoine Poche has seen and examined her today and feels she is stable for discharge.  Discharge Vitals: Blood pressure 185/78, pulse 65, temperature 98.3 F (36.8 C), temperature source Oral, resp. rate 18, height 5\' 5"  (1.651 m), weight 223 lb 8.7 oz (101.4 kg), SpO2 96.00%.  Labs: Lab Results  Component Value Date   WBC 10.8* 05/27/2011   HGB 14.7 05/27/2011   HCT 41.4 05/27/2011   MCV 88.1 05/27/2011   PLT 196 05/27/2011     Lab 05/27/11 0405  NA 143  K 3.7  CL 106  CO2 28  BUN 8  CREATININE 0.45*  CALCIUM 9.7  PROT --  BILITOT  --  ALKPHOS --  ALT --  AST --  GLUCOSE 112*    Diagnostic Studies/Procedures   1. Cardiac catheterization this admission, please see full report and above for summary.  Discharge Medications   Medication List  As of 05/27/2011 10:03 AM   TAKE these medications         amLODipine-benazepril 5-20 MG per capsule   Commonly known as: LOTREL   Take 1 capsule by mouth daily.      aspirin 81 MG EC tablet   Take 81 mg by mouth daily.      atenolol 100 MG tablet   Commonly known as: TENORMIN   Take 100 mg by mouth daily.      atorvastatin 20 MG tablet   Commonly known as: LIPITOR   Take 1 tablet (20 mg total) by mouth at bedtime.      clopidogrel 75 MG tablet   Commonly known as: PLAVIX   Take 1 tablet (75 mg total) by mouth daily with breakfast.      hydrochlorothiazide 25 MG tablet   Commonly known as: HYDRODIURIL   Take 25 mg by mouth daily.      nitroGLYCERIN 0.4 MG SL tablet   Commonly known as: NITROSTAT   Place 0.4 mg under the tongue every 5 (five) minutes as needed. For chest pain      sertraline 50 MG tablet   Commonly known as: ZOLOFT   Take 50 mg  by mouth daily.            Disposition   The patient will be discharged in stable condition to home. Discharge Orders    Future Orders Please Complete By Expires   Diet - low sodium heart healthy      Increase activity slowly      Comments:   No driving for 2 days. No lifting over 5 lbs for 1 week. No sexual activity for 1 week. You may return to work on 06/03/11. Keep procedure site clean & dry. If you notice increased pain, swelling, bleeding or pus, call/return!  You may shower, but no soaking baths/hot tubs/pools for 1 week.       Follow-up Information    Follow up with Dietrich Pates, MD. (Dr. Charlott Rakes office will call you for an appointment. We will also check repeat bloodwork on your cholesterol in 6 weeks.)    Contact information:   [address] [place]  [address] [phone]            Duration of Discharge Encounter: Greater than 30 minutes including physician and PA time.  Signed, Ronie Spies PA-C 05/27/2011, 10:03 AM  Patient seen and examined.  Plan as discussed in my rounding note for today and outlined above. Fayrene Fearing Farmer Mccahill  05/27/2011  1:16 PM

## 2011-05-27 NOTE — H&P (Signed)
   Discharge Summary   Patient ID: Karina Oneal MRN: [id number], DOB/AGE: Jun 09, 1955 56 y.o. Admit date: 05/26/2011 D/C date:     05/27/2011   Note opened in error. See D/C Summary.  Briseida Gittings PA-C

## 2011-05-27 NOTE — Progress Notes (Signed)
Pt has been walking independently without problems. Ed completed. Pt does not feel motivated to quit smoking at this time. Quit on Chantix in the past. Requests her name be sent to [facility] although pt does not sound too interested in this either.  [phone] Karina Oneal CES, ACSM

## 2011-05-27 NOTE — Progress Notes (Signed)
   SUBJECTIVE:  No chest pain.  No SOB   PHYSICAL EXAM Filed Vitals:   05/26/11 1630 05/26/11 2017 05/27/11 0039 05/27/11 0529  BP:  152/65 148/56 151/66  Pulse:  57 73 68  Temp:  98.6 F (37 C) 98.4 F (36.9 C) 98.1 F (36.7 C)  TempSrc:  Oral Oral Oral  Resp:  16 20 17   Height: 5\' 5"  (1.651 m)     Weight: 102 kg (224 lb 13.9 oz)   101.4 kg (223 lb 8.7 oz)  SpO2:  95% 96% 95%   General:  No distress Lungs:  Clear Heart:  RRR Abdomen:  Positive bowel sounds, no rebound no guarding Extremities:  No edema, right groin with slight echymosis but no hematoma or bruit.  LABS: No results found for this basename: CKTOTAL, CKMB, CKMBINDEX, TROPONINI   Results for orders placed during the hospital encounter of 05/26/11 (from the past 24 hour(s))  POCT ACTIVATED CLOTTING TIME     Status: Normal   Collection Time   05/26/11  2:31 PM      Component Value Range   Activated Clotting Time 375    CBC     Status: Abnormal   Collection Time   05/27/11  4:05 AM      Component Value Range   WBC 10.8 (*) 4.0 - 10.5 (K/uL)   RBC 4.70  3.87 - 5.11 (MIL/uL)   Hemoglobin 14.7  12.0 - 15.0 (g/dL)   HCT 16.1  09.6 - 04.5 (%)   MCV 88.1  78.0 - 100.0 (fL)   MCH 31.3  26.0 - 34.0 (pg)   MCHC 35.5  30.0 - 36.0 (g/dL)   RDW 40.9  81.1 - 91.4 (%)   Platelets 196  150 - 400 (K/uL)  BASIC METABOLIC PANEL     Status: Abnormal   Collection Time   05/27/11  4:05 AM      Component Value Range   Sodium 143  135 - 145 (mEq/L)   Potassium 3.7  3.5 - 5.1 (mEq/L)   Chloride 106  96 - 112 (mEq/L)   CO2 28  19 - 32 (mEq/L)   Glucose, Bld 112 (*) 70 - 99 (mg/dL)   BUN 8  6 - 23 (mg/dL)   Creatinine, Ser 7.82 (*) 0.50 - 1.10 (mg/dL)   Calcium 9.7  8.4 - 95.6 (mg/dL)   GFR calc non Af Amer >90  >90 (mL/min)   GFR calc Af Amer >90  >90 (mL/min)    Intake/Output Summary (Last 24 hours) at 05/27/11 0859 Last data filed at 05/26/11 2200  Gross per 24 hour  Intake   1390 ml  Output   1300 ml  Net     90  ml      ASSESSMENT AND PLAN:  CAD:  Post PCI.  OK for discharge today.    Tobacco:  Educated  Hyperlipidemia:  She agrees to restart Lipitor at 20 mg daily  HTN:  She reports that her BP is well controlled usually.   See extender or Dr. Tenny Craw in two weeks or less in the office.  Rollene Rotunda 05/27/2011 8:59 AM

## 2011-05-27 NOTE — Progress Notes (Signed)
Pt had quit on the chantix before. However she relapsed and currently she doesn't want to quit at this time. She does want to quit eventually but not now. Advised and encouraged pt to quit. Referred to 1-800 quit now for f/u and support. Discussed oral fixation substitutes, second hand smoke and in home smoking policy. Reviewed and gave pt Written education/contact information.

## 2011-05-27 NOTE — Progress Notes (Signed)
UR Completed. Simmons, Kizzi Overbey F [phone]  

## 2011-05-28 MED FILL — Dextrose Inj 5%: INTRAVENOUS | Qty: 50 | Status: AC

## 2011-06-05 ENCOUNTER — Ambulatory Visit (INDEPENDENT_AMBULATORY_CARE_PROVIDER_SITE_OTHER): Payer: Commercial Managed Care - PPO | Admitting: Internal Medicine

## 2011-06-05 ENCOUNTER — Encounter: Payer: Self-pay | Admitting: Internal Medicine

## 2011-06-05 ENCOUNTER — Telehealth: Payer: Self-pay | Admitting: Internal Medicine

## 2011-06-05 DIAGNOSIS — I2510 Atherosclerotic heart disease of native coronary artery without angina pectoris: Secondary | ICD-10-CM | POA: Insufficient documentation

## 2011-06-05 DIAGNOSIS — E785 Hyperlipidemia, unspecified: Secondary | ICD-10-CM

## 2011-06-05 DIAGNOSIS — F17200 Nicotine dependence, unspecified, uncomplicated: Secondary | ICD-10-CM

## 2011-06-05 DIAGNOSIS — I10 Essential (primary) hypertension: Secondary | ICD-10-CM

## 2011-06-05 DIAGNOSIS — Z720 Tobacco use: Secondary | ICD-10-CM

## 2011-06-05 MED ORDER — ATORVASTATIN CALCIUM 20 MG PO TABS: 20.0000 mg | ORAL_TABLET | Freq: Every day | ORAL | Status: DC

## 2011-06-05 MED ORDER — CLOPIDOGREL BISULFATE 75 MG PO TABS: 75.0000 mg | ORAL_TABLET | Freq: Every day | ORAL | Status: AC

## 2011-06-05 MED ORDER — VARENICLINE TARTRATE 1 MG PO TABS: 1.0000 mg | ORAL_TABLET | Freq: Two times a day (BID) | ORAL | Status: AC

## 2011-06-05 MED ORDER — VARENICLINE TARTRATE 0.5 MG PO TABS: 0.5000 mg | ORAL_TABLET | Freq: Two times a day (BID) | ORAL | Status: AC

## 2011-06-05 NOTE — Assessment & Plan Note (Signed)
Wants to quit.  Is down to 1/2 ppd.  Will Rx chantix.

## 2011-06-05 NOTE — Assessment & Plan Note (Addendum)
Patient doing well since d/c.  Breathing is better.  No chest pressure, no wrist pain. Keep on current regimen.  Rec to increase her activity.  Plavix for at least a year.

## 2011-06-05 NOTE — Assessment & Plan Note (Signed)
BP is good

## 2011-06-05 NOTE — Progress Notes (Signed)
HPI Patient is a 56 yr old who I saw in clinic for CP.  She had a abnormal stress myoview and was recently d/c'd from [facility] She underwent IVUS guided DEs to the RCA on 1/29 She was discharged on aspirin and plavix. Since d/c.is breathing better.  NO CP No wrist pain which may have been her anginal equivalent.  Allergies  Allergen Reactions  . Ciprofloxacin Nausea And Vomiting  . Sulfa Antibiotics Itching    Current Outpatient Prescriptions  Medication Sig Dispense Refill  . amLODipine-benazepril (LOTREL) 5-20 MG per capsule Take 1 capsule by mouth daily.        Marland Kitchen aspirin 81 MG EC tablet Take 81 mg by mouth daily.        Marland Kitchen atenolol (TENORMIN) 100 MG tablet Take 100 mg by mouth daily.        Marland Kitchen atorvastatin (LIPITOR) 20 MG tablet Take 1 tablet (20 mg total) by mouth at bedtime.  30 tablet  6  . clopidogrel (PLAVIX) 75 MG tablet Take 1 tablet (75 mg total) by mouth daily with breakfast.  30 tablet  6  . hydrochlorothiazide 25 MG tablet Take 25 mg by mouth daily.        . nitroGLYCERIN (NITROSTAT) 0.4 MG SL tablet Place 0.4 mg under the tongue every 5 (five) minutes as needed. For chest pain      . sertraline (ZOLOFT) 50 MG tablet Take 50 mg by mouth daily.        Past Medical History  Diagnosis Date  . Diabetes mellitus, undetermined   . Hypertension   . Diverticulitis   . Obesity   . GERD (gastroesophageal reflux disease)   . CAD (coronary artery disease)     s/p IVUS-guided DES to RCA 05/26/11 following abnormal stress test  . Dyslipidemia     Past Surgical History  Procedure Date  . Cholecystectomy   . Tubal ligation   . Foot surgery   . Laparoscopic gastric banding     Family History  Problem Relation Age of Onset  . Asthma Father     History   Social History  . Marital Status: Divorced    Spouse Name: N/A    Number of Children: N/A  . Years of Education: N/A   Occupational History  . RESPIRATORY THERAPIST    Social History Main Topics  . Smoking  status: Current Everyday Smoker -- 0.5 packs/day for 40 years    Types: Cigarettes  . Smokeless tobacco: Not on file  . Alcohol Use: No  . Drug Use: No  . Sexually Active: Not Currently    Birth Control/ Protection: Post-menopausal   Other Topics Concern  . Not on file   Social History Narrative  . No narrative on file    Review of Systems:  All systems reviewed.  They are negative to the above problem except as previously stated.  Vital Signs: BP 118/80  Pulse 60  Ht 5\' 5"  (1.651 m)  Wt 223 lb (101.152 kg)  BMI 37.11 kg/m2  Physical Exam  Patient is in NAD> HEENT:  Normocephalic, atraumatic. EOMI, PERRLA.  Neck: JVP is normal. No thyromegaly. No bruits.  Lungs: clear to auscultation. No rales no wheezes.  Heart: Regular rate and rhythm. Normal S1, S2. No S3.   No significant murmurs. PMI not displaced.  Abdomen:  Supple, nontender. Normal bowel sounds. No masses. No hepatomegaly.  Extremities:   Good distal pulses throughout. No lower extremity edema.  Musculoskeletal :moving all extremities.  Neuro:   alert and oriented x3.  CN II-XII grossly intact.   Assessment and Plan:

## 2011-06-05 NOTE — Assessment & Plan Note (Signed)
Received labs from [facility]. [facility] Kail.  LDL in August was 93.  She had finished Zocor 20. I would recomm switching to lipitor.  Dose with zocor I am worried will be limiting as she is on amlodipine.  She will need f/u lipid and ast in about 2 months.

## 2011-06-05 NOTE — Telephone Encounter (Signed)
New msg [facility] out pt pharmacy wants to clarify dosage of chantix Please call back

## 2011-06-05 NOTE — Progress Notes (Signed)
Addended by: Vickie Epley on: 06/05/2011 03:22 PM   Modules accepted: Orders

## 2011-06-05 NOTE — Telephone Encounter (Signed)
Called pharm to confirm 

## 2011-06-05 NOTE — Patient Instructions (Signed)
Follow up with Dr.Ross June 2013

## 2011-06-10 ENCOUNTER — Encounter: Payer: Self-pay | Admitting: Internal Medicine

## 2011-07-09 ENCOUNTER — Ambulatory Visit (INDEPENDENT_AMBULATORY_CARE_PROVIDER_SITE_OTHER): Payer: 59 | Admitting: *Deleted

## 2011-07-09 DIAGNOSIS — E785 Hyperlipidemia, unspecified: Secondary | ICD-10-CM

## 2011-07-09 LAB — BASIC METABOLIC PANEL
CO2: 32 mEq/L (ref 19–32)
Chloride: 100 mEq/L (ref 96–112)
Glucose, Bld: 197 mg/dL — ABNORMAL HIGH (ref 70–99)
Sodium: 139 mEq/L (ref 135–145)

## 2011-07-09 LAB — HEPATIC FUNCTION PANEL
Albumin: 4 g/dL (ref 3.5–5.2)
Alkaline Phosphatase: 104 U/L (ref 39–117)
Total Bilirubin: 0.2 mg/dL — ABNORMAL LOW (ref 0.3–1.2)

## 2011-07-09 LAB — LIPID PANEL
HDL: 54.4 mg/dL (ref >39.00)
LDL Cholesterol: 81 mg/dL (ref 0–99)
Total CHOL/HDL Ratio: 3
Triglycerides: 141 mg/dL (ref 0.0–149.0)

## 2011-09-08 ENCOUNTER — Encounter: Payer: Self-pay | Admitting: Internal Medicine

## 2011-10-23 ENCOUNTER — Ambulatory Visit (INDEPENDENT_AMBULATORY_CARE_PROVIDER_SITE_OTHER): Payer: 59 | Admitting: Internal Medicine

## 2011-10-23 ENCOUNTER — Encounter: Payer: Self-pay | Admitting: Internal Medicine

## 2011-10-23 VITALS — BP 128/78 | HR 65 | Ht 65.0 in | Wt 239.0 lb

## 2011-10-23 DIAGNOSIS — I2510 Atherosclerotic heart disease of native coronary artery without angina pectoris: Secondary | ICD-10-CM

## 2011-10-23 NOTE — Patient Instructions (Signed)
Your physician wants you to follow-up in: January 2014 You will receive a reminder letter in the mail two months in advance. If you don't receive a letter, please call our office to schedule the follow-up appointment.  

## 2011-10-23 NOTE — Progress Notes (Signed)
HPI Patient is a 52 yar old with a history of CAD.  She underwent IVUS guided DES to the RCA in January of this year.  I saw her in clinic in February. Lipids in March LDL was 81, HDL 54 SHe stopped lipitor around that time due to achiness. Benazepril stopped due cough  On Losartan  Had blood work at Dr. Charlott Rakes office. Occasional twinge in L wrist.  Not lasting   Shoulder pain prob musculoskel.  Allergies  Allergen Reactions  . Ciprofloxacin Nausea And Vomiting  . Sulfa Antibiotics Itching    Current Outpatient Prescriptions  Medication Sig Dispense Refill  . amLODipine (NORVASC) 5 MG tablet Take 5 mg by mouth daily.      Marland Kitchen aspirin 81 MG EC tablet Take 81 mg by mouth daily.        Marland Kitchen atenolol (TENORMIN) 100 MG tablet Take 100 mg by mouth daily.        . clopidogrel (PLAVIX) 75 MG tablet Take 1 tablet (75 mg total) by mouth daily with breakfast.  90 tablet  3  . hydrochlorothiazide 25 MG tablet Take 25 mg by mouth daily.        Marland Kitchen losartan (COZAAR) 100 MG tablet Take 100 mg by mouth daily.      . nitroGLYCERIN (NITROSTAT) 0.4 MG SL tablet Place 0.4 mg under the tongue every 5 (five) minutes as needed. For chest pain      . sertraline (ZOLOFT) 50 MG tablet Take 50 mg by mouth daily.        Past Medical History  Diagnosis Date  . Diabetes mellitus, undetermined   . Hypertension   . Diverticulitis   . Obesity   . GERD (gastroesophageal reflux disease)   . CAD (coronary artery disease)     s/p IVUS-guided DES to RCA 05/26/11 following abnormal stress test  . Dyslipidemia     Past Surgical History  Procedure Date  . Cholecystectomy   . Tubal ligation   . Foot surgery   . Laparoscopic gastric banding     Family History  Problem Relation Age of Onset  . Asthma Father     History   Social History  . Marital Status: Divorced    Spouse Name: N/A    Number of Children: N/A  . Years of Education: N/A   Occupational History  . RESPIRATORY THERAPIST    Social History  Main Topics  . Smoking status: Current Everyday Smoker -- 0.5 packs/day for 40 years    Types: Cigarettes  . Smokeless tobacco: Not on file  . Alcohol Use: No  . Drug Use: No  . Sexually Active: Not Currently    Birth Control/ Protection: Post-menopausal   Other Topics Concern  . Not on file   Social History Narrative  . No narrative on file    Review of Systems:  All systems reviewed.  They are negative to the above problem except as previously stated.  Vital Signs: BP 128/78  Ht 5\' 5"  (1.651 m)  Wt 239 lb (108.41 kg)  BMI 39.77 kg/m2  Physical Exam Patient is in NAD HEENT:  Normocephalic, atraumatic. EOMI, PERRLA.  Neck: JVP is normal. No thyromegaly. No bruits.  Lungs: clear to auscultation. No rales no wheezes.  Heart: Regular rate and rhythm. Normal S1, S2. No S3.   No significant murmurs. PMI not displaced.  Abdomen:  Supple, nontender. Normal bowel sounds. No masses. No hepatomegaly.  Extremities:   Good distal pulses throughout. No lower extremity  edema.  Musculoskeletal :moving all extremities.  Neuro:   alert and oriented x3.  CN II-XII grossly intact.  EKG:  SR.  65 bpm.  Nonspecific ST T wave changes. Assessment and Plan:  1.  CAD  No symptoms to sugg angina.  Keep on same regimen.  Will see in January  2.  HTN  Adequate control.  No Change.  Get labs for Dr. Tenny Craw' office  3.  HL  Needs a statin.  Will Give samples of Crestor.  Take 5 mg 3x per wk.  Lipids in 2 months.  Call with problems.  F/U in January.

## 2011-12-15 ENCOUNTER — Other Ambulatory Visit: Payer: Self-pay

## 2011-12-15 ENCOUNTER — Encounter (HOSPITAL_COMMUNITY): Payer: Self-pay | Admitting: *Deleted

## 2011-12-15 ENCOUNTER — Emergency Department (HOSPITAL_COMMUNITY): Payer: 59

## 2011-12-15 ENCOUNTER — Inpatient Hospital Stay (HOSPITAL_COMMUNITY)
Admission: EM | Admit: 2011-12-15 | Discharge: 2011-12-17 | DRG: 247 | Disposition: A | Discharge status: Home / Self Care | Payer: 59 | Attending: Cardiology | Admitting: Cardiology

## 2011-12-15 DIAGNOSIS — I2510 Atherosclerotic heart disease of native coronary artery without angina pectoris: Principal | ICD-10-CM

## 2011-12-15 DIAGNOSIS — E669 Obesity, unspecified: Secondary | ICD-10-CM

## 2011-12-15 DIAGNOSIS — R7309 Other abnormal glucose: Secondary | ICD-10-CM | POA: Diagnosis present

## 2011-12-15 DIAGNOSIS — I10 Essential (primary) hypertension: Secondary | ICD-10-CM

## 2011-12-15 DIAGNOSIS — Z9884 Bariatric surgery status: Secondary | ICD-10-CM

## 2011-12-15 DIAGNOSIS — F17200 Nicotine dependence, unspecified, uncomplicated: Secondary | ICD-10-CM | POA: Diagnosis present

## 2011-12-15 DIAGNOSIS — Z6838 Body mass index (BMI) 38.0-38.9, adult: Secondary | ICD-10-CM

## 2011-12-15 DIAGNOSIS — E785 Hyperlipidemia, unspecified: Secondary | ICD-10-CM | POA: Diagnosis present

## 2011-12-15 DIAGNOSIS — I200 Unstable angina: Secondary | ICD-10-CM | POA: Diagnosis present

## 2011-12-15 DIAGNOSIS — E119 Type 2 diabetes mellitus without complications: Secondary | ICD-10-CM

## 2011-12-15 DIAGNOSIS — Z9089 Acquired absence of other organs: Secondary | ICD-10-CM

## 2011-12-15 DIAGNOSIS — K219 Gastro-esophageal reflux disease without esophagitis: Secondary | ICD-10-CM | POA: Diagnosis present

## 2011-12-15 DIAGNOSIS — Z9861 Coronary angioplasty status: Secondary | ICD-10-CM

## 2011-12-15 DIAGNOSIS — R079 Chest pain, unspecified: Secondary | ICD-10-CM

## 2011-12-15 DIAGNOSIS — E876 Hypokalemia: Secondary | ICD-10-CM

## 2011-12-15 DIAGNOSIS — Z882 Allergy status to sulfonamides status: Secondary | ICD-10-CM

## 2011-12-15 DIAGNOSIS — Z720 Tobacco use: Secondary | ICD-10-CM

## 2011-12-15 DIAGNOSIS — Z955 Presence of coronary angioplasty implant and graft: Secondary | ICD-10-CM

## 2011-12-15 LAB — POCT I-STAT, CHEM 8
Creatinine, Ser: 0.7 mg/dL (ref 0.50–1.10)
Hemoglobin: 15 g/dL (ref 12.0–15.0)
Potassium: 4 mEq/L (ref 3.5–5.1)
Sodium: 142 mEq/L (ref 135–145)

## 2011-12-15 LAB — CBC
Hemoglobin: 14.8 g/dL (ref 12.0–15.0)
MCH: 31.7 pg (ref 26.0–34.0)
MCHC: 35 g/dL (ref 30.0–36.0)
MCV: 90.6 fL (ref 78.0–100.0)
RBC: 4.67 MIL/uL (ref 3.87–5.11)

## 2011-12-15 LAB — POCT I-STAT TROPONIN I: Troponin i, poc: 0.01 ng/mL (ref 0.00–0.08)

## 2011-12-15 NOTE — ED Notes (Signed)
Pt with hx of a 99% blockage of her lad c/o same symptoms - L wrist pain, minimal chest pain, L chest pain x 3 days.  States symptoms are getting worse.

## 2011-12-16 ENCOUNTER — Encounter (HOSPITAL_COMMUNITY)
Admission: EM | Disposition: A | Discharge status: Home / Self Care | Payer: Self-pay | Source: Home / Self Care | Attending: Cardiology

## 2011-12-16 ENCOUNTER — Encounter (HOSPITAL_COMMUNITY): Payer: Self-pay | Admitting: Cardiology

## 2011-12-16 ENCOUNTER — Other Ambulatory Visit: Payer: Self-pay

## 2011-12-16 DIAGNOSIS — I2510 Atherosclerotic heart disease of native coronary artery without angina pectoris: Secondary | ICD-10-CM

## 2011-12-16 DIAGNOSIS — E876 Hypokalemia: Secondary | ICD-10-CM

## 2011-12-16 DIAGNOSIS — R079 Chest pain, unspecified: Secondary | ICD-10-CM

## 2011-12-16 HISTORY — PX: LEFT HEART CATHETERIZATION WITH CORONARY ANGIOGRAM: SHX5451

## 2011-12-16 LAB — MRSA PCR SCREENING: MRSA by PCR: NEGATIVE

## 2011-12-16 LAB — COMPREHENSIVE METABOLIC PANEL
ALT: 19 U/L (ref 0–35)
AST: 17 U/L (ref 0–37)
Albumin: 3.3 g/dL — ABNORMAL LOW (ref 3.5–5.2)
Calcium: 9.3 mg/dL (ref 8.4–10.5)
GFR calc Af Amer: >90 mL/min (ref >90)
Glucose, Bld: 232 mg/dL — ABNORMAL HIGH (ref 70–99)
Sodium: 142 mEq/L (ref 135–145)
Total Protein: 6.4 g/dL (ref 6.0–8.3)

## 2011-12-16 LAB — CARDIAC PANEL(CRET KIN+CKTOT+MB+TROPI)
CK, MB: 1.7 ng/mL (ref 0.3–4.0)
Relative Index: INVALID (ref 0.0–2.5)
Total CK: 74 U/L (ref 7–177)
Troponin I: <0.3 ng/mL (ref <0.30)
Troponin I: <0.3 ng/mL (ref <0.30)

## 2011-12-16 LAB — HEPARIN LEVEL (UNFRACTIONATED): Heparin Unfractionated: <0.1 IU/mL — ABNORMAL LOW (ref 0.30–0.70)

## 2011-12-16 LAB — CBC WITH DIFFERENTIAL/PLATELET
Basophils Absolute: 0 10*3/uL (ref 0.0–0.1)
Basophils Relative: 0 % (ref 0–1)
Eosinophils Absolute: 0.3 10*3/uL (ref 0.0–0.7)
Eosinophils Relative: 3 % (ref 0–5)
MCH: 30.8 pg (ref 26.0–34.0)
MCHC: 34.2 g/dL (ref 30.0–36.0)
MCV: 90.1 fL (ref 78.0–100.0)
Neutrophils Relative %: 64 % (ref 43–77)
Platelets: 198 10*3/uL (ref 150–400)
RDW: 12.8 % (ref 11.5–15.5)

## 2011-12-16 LAB — PROTIME-INR: Prothrombin Time: 14.2 seconds (ref 11.6–15.2)

## 2011-12-16 SURGERY — LEFT HEART CATHETERIZATION WITH CORONARY ANGIOGRAM
Anesthesia: LOCAL

## 2011-12-16 MED ORDER — ATENOLOL 100 MG PO TABS
100.0000 mg | ORAL_TABLET | Freq: Every day | ORAL | Status: DC
  Administered 2011-12-16: 100 mg via ORAL
  Filled 2011-12-16 (×2): qty 1

## 2011-12-16 MED ORDER — ATENOLOL 100 MG PO TABS
100.0000 mg | ORAL_TABLET | Freq: Every day | ORAL | Status: DC
  Filled 2011-12-16: qty 1

## 2011-12-16 MED ORDER — HEPARIN (PORCINE) IN NACL 100-0.45 UNIT/ML-% IJ SOLN
1300.0000 [IU]/h | INTRAMUSCULAR | Status: DC
  Administered 2011-12-16 (×2): 1000 [IU]/h via INTRAVENOUS
  Administered 2011-12-16: 1300 [IU]/h via INTRAVENOUS
  Filled 2011-12-16 (×2): qty 250

## 2011-12-16 MED ORDER — SODIUM CHLORIDE 0.9 % IV SOLN: 250.0000 mL | INTRAVENOUS | Status: DC | PRN

## 2011-12-16 MED ORDER — SERTRALINE HCL 50 MG PO TABS
50.0000 mg | ORAL_TABLET | Freq: Every day | ORAL | Status: DC
  Administered 2011-12-16: 50 mg via ORAL
  Filled 2011-12-16 (×2): qty 1

## 2011-12-16 MED ORDER — SODIUM CHLORIDE 0.9 % IV SOLN
1.0000 mL/kg/h | INTRAVENOUS | Status: DC
  Administered 2011-12-16: 1 mL/kg/h via INTRAVENOUS

## 2011-12-16 MED ORDER — ASPIRIN 81 MG PO CHEW
324.0000 mg | CHEWABLE_TABLET | ORAL | Status: AC
  Administered 2011-12-16: 324 mg via ORAL
  Filled 2011-12-16: qty 4

## 2011-12-16 MED ORDER — CLOPIDOGREL BISULFATE 75 MG PO TABS
75.0000 mg | ORAL_TABLET | Freq: Every day | ORAL | Status: DC
  Administered 2011-12-16: 75 mg via ORAL
  Filled 2011-12-16 (×2): qty 1

## 2011-12-16 MED ORDER — SODIUM CHLORIDE 0.9 % IJ SOLN
3.0000 mL | Freq: Two times a day (BID) | INTRAMUSCULAR | Status: DC
  Administered 2011-12-16: 3 mL via INTRAVENOUS

## 2011-12-16 MED ORDER — HEPARIN BOLUS VIA INFUSION
4000.0000 [IU] | Freq: Once | INTRAVENOUS | Status: AC
  Administered 2011-12-16: 4000 [IU] via INTRAVENOUS

## 2011-12-16 MED ORDER — ACETAMINOPHEN 325 MG PO TABS: 650.0000 mg | ORAL_TABLET | ORAL | Status: DC | PRN

## 2011-12-16 MED ORDER — LOSARTAN POTASSIUM 50 MG PO TABS: 100.0000 mg | ORAL_TABLET | Freq: Every day | ORAL | Status: DC

## 2011-12-16 MED ORDER — SODIUM CHLORIDE 0.9 % IV SOLN
1.0000 mL/kg/h | INTRAVENOUS | Status: AC
  Administered 2011-12-16: 1 mL/kg/h via INTRAVENOUS

## 2011-12-16 MED ORDER — NITROGLYCERIN 0.4 MG SL SUBL: 0.4000 mg | SUBLINGUAL_TABLET | SUBLINGUAL | Status: DC | PRN

## 2011-12-16 MED ORDER — ADENOSINE 12 MG/4ML IV SOLN
16.0000 mL | Freq: Once | INTRAVENOUS | Status: AC
  Administered 2011-12-16: 48 mg via INTRAVENOUS
  Filled 2011-12-16: qty 16

## 2011-12-16 MED ORDER — AMLODIPINE BESYLATE 5 MG PO TABS
5.0000 mg | ORAL_TABLET | Freq: Every day | ORAL | Status: DC
  Administered 2011-12-16: 5 mg via ORAL
  Filled 2011-12-16 (×2): qty 1

## 2011-12-16 MED ORDER — FENTANYL CITRATE 0.05 MG/ML IJ SOLN
INTRAMUSCULAR | Status: AC
  Filled 2011-12-16: qty 2

## 2011-12-16 MED ORDER — HYDROCHLOROTHIAZIDE 25 MG PO TABS
25.0000 mg | ORAL_TABLET | Freq: Every day | ORAL | Status: DC
  Administered 2011-12-16: 25 mg via ORAL
  Filled 2011-12-16 (×2): qty 1

## 2011-12-16 MED ORDER — MIDAZOLAM HCL 2 MG/2ML IJ SOLN
INTRAMUSCULAR | Status: AC
  Filled 2011-12-16: qty 2

## 2011-12-16 MED ORDER — POTASSIUM CHLORIDE CRYS ER 20 MEQ PO TBCR
40.0000 meq | EXTENDED_RELEASE_TABLET | Freq: Once | ORAL | Status: AC
  Administered 2011-12-16: 40 meq via ORAL
  Filled 2011-12-16: qty 2

## 2011-12-16 MED ORDER — ASPIRIN EC 81 MG PO TBEC
81.0000 mg | DELAYED_RELEASE_TABLET | Freq: Every day | ORAL | Status: DC
  Filled 2011-12-16: qty 1

## 2011-12-16 MED ORDER — POTASSIUM CHLORIDE CRYS ER 20 MEQ PO TBCR
20.0000 meq | EXTENDED_RELEASE_TABLET | Freq: Once | ORAL | Status: AC
  Administered 2011-12-16: 20 meq via ORAL
  Filled 2011-12-16: qty 1

## 2011-12-16 MED ORDER — HEPARIN BOLUS VIA INFUSION
2500.0000 [IU] | Freq: Once | INTRAVENOUS | Status: AC
  Administered 2011-12-16: 2500 [IU] via INTRAVENOUS
  Filled 2011-12-16: qty 2500

## 2011-12-16 MED ORDER — SODIUM CHLORIDE 0.9 % IJ SOLN: 3.0000 mL | INTRAMUSCULAR | Status: DC | PRN

## 2011-12-16 MED ORDER — ATORVASTATIN CALCIUM 10 MG PO TABS
10.0000 mg | ORAL_TABLET | Freq: Every day | ORAL | Status: DC
  Filled 2011-12-16 (×2): qty 1

## 2011-12-16 MED ORDER — VERAPAMIL HCL 2.5 MG/ML IV SOLN
INTRAVENOUS | Status: AC
  Filled 2011-12-16: qty 2

## 2011-12-16 MED ORDER — NITROGLYCERIN 2 % TD OINT
1.0000 [in_us] | TOPICAL_OINTMENT | Freq: Four times a day (QID) | TRANSDERMAL | Status: DC
  Administered 2011-12-16 (×2): 1 [in_us] via TOPICAL
  Filled 2011-12-16: qty 30

## 2011-12-16 MED ORDER — ASPIRIN 81 MG PO TBEC: 81.0000 mg | DELAYED_RELEASE_TABLET | Freq: Every day | ORAL | Status: DC

## 2011-12-16 MED ORDER — OXYCODONE-ACETAMINOPHEN 5-325 MG PO TABS
1.0000 | ORAL_TABLET | ORAL | Status: DC | PRN
  Administered 2011-12-16 – 2011-12-17 (×3): 1 via ORAL
  Filled 2011-12-16 (×3): qty 1

## 2011-12-16 MED ORDER — ONDANSETRON HCL 4 MG/2ML IJ SOLN: 4.0000 mg | Freq: Four times a day (QID) | INTRAMUSCULAR | Status: DC | PRN

## 2011-12-16 MED ORDER — LOSARTAN POTASSIUM 50 MG PO TABS
100.0000 mg | ORAL_TABLET | Freq: Every day | ORAL | Status: DC
  Administered 2011-12-16: 100 mg via ORAL
  Filled 2011-12-16 (×2): qty 2

## 2011-12-16 NOTE — Progress Notes (Signed)
ANTICOAGULATION CONSULT NOTE - Initial Consult  Pharmacy Consult for Heparin Indication: chest pain/ACS  Allergies  Allergen Reactions  . Ciprofloxacin Nausea And Vomiting  . Sulfa Antibiotics Itching    Patient Measurements: Height: 5\' 5"  (165.1 cm) Weight: 235 lb (106.595 kg) IBW/kg (Calculated) : 57  Heparin Dosing Weight: 82 kg   Vital Signs: Temp: 98.1 F (36.7 C) (08/20 2203) Temp src: Oral (08/20 2203) BP: 125/55 mmHg (08/20 2333) Pulse Rate: 57  (08/20 2203)  Labs:  Basename 12/15/11 2231 12/15/11 2214  HGB 15.0 14.8  HCT 44.0 42.3  PLT -- 227  APTT -- --  LABPROT -- --  INR -- --  HEPARINUNFRC -- --  CREATININE 0.70 --  CKTOTAL -- --  CKMB -- --  TROPONINI -- --    Estimated Creatinine Clearance: 96.3 ml/min (by C-G formula based on Cr of 0.7).   Medical History: Past Medical History  Diagnosis Date  . Diabetes mellitus, undetermined   . Hypertension   . Diverticulitis   . Obesity   . GERD (gastroesophageal reflux disease)   . CAD (coronary artery disease)     s/p IVUS-guided DES to RCA 05/26/11 following abnormal stress test  . Dyslipidemia     Medications:  Scheduled:     Assessment: 56 yo female admitted with r/o ACS. Per physician note, plan for cardiac cath. Pharmacy consulted to manage heparin.   Goal of Therapy:  Heparin level 0.3-0.7 units/ml Monitor platelets by anticoagulation protocol: Yes   Plan:  1. Heparin 4000 unit IV bolus x 1, then IV infusion of 1000 units/hr. 2. Heparin level in 6 hours.  3. Daily CBC, heparin level.  Emeline Gins 12/16/2011,1:08 AM

## 2011-12-16 NOTE — Progress Notes (Addendum)
Inpatient Diabetes Program Recommendations  AACE/ADA: New Consensus Statement on Inpatient Glycemic Control (2013)  Target Ranges:  Prepandial:   less than 140 mg/dL      Peak postprandial:   less than 180 mg/dL (1-2 hours)      Critically ill patients:  140 - 180 mg/dL    Results for Karina Oneal, Karina Oneal (MRN [id number]) as of 12/16/2011 11:29  Ref. Range 12/16/2011 03:28  Glucose Latest Range: 70-99 mg/dL 875 (H)    Patient with documented history of "borderline diabetes" per H&P.  Lab glucose elevated today on BMET.  Inpatient Diabetes Program Recommendations Correction (SSI): Please check CBGs and cover with Novolog Sensitive correction scale (SSi) tid ac + HS. MD: Please also check a current A1c to assess home glucose levels- No A1c on file.  Note: Will follow. Ambrose Finland RN, MSN, CDE Diabetes Coordinator Inpatient Diabetes Program [phone]

## 2011-12-16 NOTE — Progress Notes (Signed)
ANTICOAGULATION CONSULT NOTE - Follow Up Consult  Pharmacy Consult for Heparin Indication: chest pain/ACS  Allergies Allergies  Allergen Reactions  . Ciprofloxacin Nausea And Vomiting  . Lisinopril Cough  . Sulfa Antibiotics Itching    Patient Measurements: Height: 5\' 5"  Weight: 106.595 kg IBW/kg  57  Heparin Dosing Weight: 82 kg  Vital Signs: BP 152/62  Pulse 63  Temp 97.8 F (36.6 C) (Oral)  Resp 16  Ht 5\' 5"  (1.651 m)  Wt 235 lb (106.595 kg)  BMI 39.11 kg/m2  SpO2 98%  Labs:  Basename 12/16/11 0838 12/16/11 0837 12/16/11 0328 12/16/11 0140 12/15/11 2231 12/15/11 2214  HGB -- -- 13.4 -- 15.0 --  HCT -- -- 39.2 -- 44.0 42.3  PLT -- -- 198 -- -- 227  LABPROT -- -- -- 14.2 -- --  INR -- -- -- 1.08 -- --  HEPARINUNFRC -- <0.10* -- -- -- --  CREATININE -- -- 0.44* -- 0.70 --  CKTOTAL 77 -- 74 -- -- --  CKMB 1.9 -- 1.8 -- -- --  TROPONINI <0.30 -- <0.30 -- -- --   Estimated Creatinine Clearance: 96.3 ml/min (by C-G formula based on Cr of 0.44).  Current Heparin Infusion:  . heparin 1,000 Units/hr (12/16/11 0345)   Assessment: 56 yo female admitted with r/o ACS Heparin level SUBtherapeutic  0.1 unit/ml at 1000 units/hr  Goal of Therapy   Heparin level 0.3-0.7 units/ml   Plan:   Give 2500 units bolus x 1  Increase heparin infusion to 1300 units/hr  Check anti-Xa level in 6 hours if Cath procedure delayed.  Karlei Waldo, Deetta Perla.D 12/16/2011, 10:07 AM

## 2011-12-16 NOTE — Interval H&P Note (Signed)
History and Physical Interval Note:  12/16/2011 4:09 PM  Karina Oneal  has presented today for surgery, with the diagnosis of cp  The various methods of treatment have been discussed with the patient and family. After consideration of risks, benefits and other options for treatment, the patient has consented to  Procedure(s) (LRB): LEFT HEART CATHETERIZATION WITH CORONARY ANGIOGRAM (N/A) as a surgical intervention .  The patient's history has been reviewed, patient examined, no change in status, stable for surgery.  I have reviewed the patient's chart and labs.  Questions were answered to the patient's satisfaction.     Theron Arista [facility] 12/16/2011 4:09 PM

## 2011-12-16 NOTE — H&P (View-Only) (Signed)
For cath study today.  PCI in 04/2011 that was IVUS guided.   Labs in order except K is low.  Will replace.  Ran out of at home and did not take. Patient agreeable.  Unfortunately, still smoking.   

## 2011-12-16 NOTE — CV Procedure (Signed)
   Cardiac Catheterization Procedure Note  Name: Karina Oneal MRN: [id number] DOB: 04-25-1956  Procedure:  Selective Coronary Angiography,  PTCA and stenting of the proximal LAD  Indication: 57 year old white female with history of tobacco abuse, obesity, diabetes mellitus, and prior stenting of the right coronary in January 2013 presents with symptoms of chest and arm pain that are similar to her prior anginal symptoms.  Procedural Details:  The right wrist was prepped, draped, and anesthetized with 1% lidocaine. Using the modified Seldinger technique, a 5 French sheath was introduced into the right radial artery. 3 mg of verapamil was administered through the sheath, weight-based unfractionated heparin was administered intravenously. Standard Judkins catheters were used for selective coronary angiography. Catheter exchanges were performed over an exchange length guidewire.  PROCEDURAL FINDINGS Hemodynamics: AO 154/86 with a mean of 110 mmHg    Coronary angiography: Coronary dominance: right  Left mainstem: Normal  Left anterior descending (LAD): There is a 70-80% fusiform lesion in the proximal LAD with moderate calcification.  Left circumflex (LCx): There are minor irregularities less than 10%.  Right coronary artery (RCA): The right coronary is a large dominant vessel. The stent in the proximal vessel is widely patent without significant disease.  Left ventriculography: Not performed  PCI Note:  Following the diagnostic procedure, the decision was made to proceed with flow wire evaluation of the LAD.  Weight-based bivalirudin was given for anticoagulation. Once a therapeutic ACT was achieved, a 5 [place] EBU 3.5 guide catheter was inserted.  A flow wire was used to cross the lesion in the proximal LAD. With IV adenosine stress a fractional flow reserve of 0.71 was obtained. We then proceeded with PCI. An a size medium coronary guidewire was placed down the LAD. The LAD lesion was  predilated with a 2.5 mm balloon. The flow wire was withdrawn.  The lesion was then stented with a 3.5 x 18 mm Xience Xpedition stent.  The stent was postdilated with a 3.75 mm noncompliant balloon.  Following PCI, there was 0% residual stenosis and TIMI-3 flow. Final angiography confirmed an excellent result. The patient tolerated the procedure well. There were no immediate procedural complications. A TR band was used for radial hemostasis. The patient was transferred to the post catheterization recovery area for further monitoring.  PCI Data: Vessel - LAD/Segment - proximal Percent Stenosis (pre)  70- 80%, abnormal fractional flow reserve TIMI-flow 3 Stent 3.5 x 18 mm Xience Xpedition Percent Stenosis (post) 0% TIMI-flow (post) 3  Final Conclusions:   1. Single vessel obstructive atherosclerotic coronary disease involving the proximal LAD. This lesion is hemodynamically significant by fractional flow reserve. The prior stent in the right coronary is widely patent. 2. Successful stenting of the proximal LAD with a drug-eluting stent.   Recommendations:  Continue dual antiplatelet therapy for least one more year. Aggressive risk factor modification.  Theron Arista [facility] 12/16/2011, 7:45 PM

## 2011-12-16 NOTE — H&P (Signed)
CARDIOLOGY ADMISSION NOTE  Patient ID: Karina Oneal MRN: [id number] DOB/AGE: 05/09/1955 56 y.o.  Admit date: 12/15/2011 Primary Physician   Daisy Floro, MD Primary Cardiologist   Dr. Dietrich Pates Chief Complaint    Wrist Pain  HPI:  The patient presents for evaluation of wrist pain. She has a history of coronary disease with PCI to the RCA in January of this year.  Since that time she done well until approximately 1 week ago. She started having left arm pain. This is predominantly in her left wrist. The sharp. It comes and goes and becomes quite severe. I have some mild chest discomfort as well. Having some mild shortness of breath but this is not particularly worse. She's not describing any PND or orthopnea. She's not describing any nausea vomiting or diaphoresis. This pain is identical to the pain she had with her previous stent. Otherwise been doing well. She doesn't exercise but she lives up a flight of stairs and does get winded walking this. She unfortunately continues to smoke cigarettes though she gave up for a while after her previous intervention by using Chantix.        Past Medical History  Diagnosis Date  . Diabetes mellitus, undetermined  (Borderline)   . Hypertension   . Diverticulitis   . Obesity   . GERD (gastroesophageal reflux disease)   . CAD (coronary artery disease)     s/p IVUS-guided DES to RCA 05/26/11 following abnormal stress test  . Dyslipidemia     Past Surgical History  Procedure Date  . Cholecystectomy   . Tubal ligation   . Foot surgery   . Laparoscopic gastric banding   . Coronary angioplasty with stent placement     LAD 05/2011    Allergies  Allergen Reactions  . Ciprofloxacin Nausea And Vomiting  . Sulfa Antibiotics Itching   No current facility-administered medications on file prior to encounter.   Current Outpatient Prescriptions on File Prior to Encounter  Medication Sig Dispense Refill  . amLODipine (NORVASC) 5 MG tablet Take 5  mg by mouth daily.      Marland Kitchen aspirin 81 MG EC tablet Take 81 mg by mouth daily.        Marland Kitchen atenolol (TENORMIN) 100 MG tablet Take 100 mg by mouth daily.        . clopidogrel (PLAVIX) 75 MG tablet Take 1 tablet (75 mg total) by mouth daily with breakfast.  90 tablet  3  . hydrochlorothiazide 25 MG tablet Take 25 mg by mouth daily.        Marland Kitchen losartan (COZAAR) 100 MG tablet Take 100 mg by mouth daily.      . nitroGLYCERIN (NITROSTAT) 0.4 MG SL tablet Place 0.4 mg under the tongue every 5 (five) minutes as needed. For chest pain      . rosuvastatin (CRESTOR) 5 MG tablet Take 5 mg by mouth 3 (three) times a week. Monday, Wednesday and Saturday      . sertraline (ZOLOFT) 50 MG tablet Take 50 mg by mouth daily.       History   Social History  . Marital Status: Divorced    Spouse Name: N/A    Number of Children: N/A  . Years of Education: N/A   Occupational History  . RESPIRATORY THERAPIST    Social History Main Topics  . Smoking status: Current Everyday Smoker -- 0.5 packs/day for 40 years    Types: Cigarettes  . Smokeless tobacco: Not on file  . Alcohol  Use: Yes     occasionally  . Drug Use: No  . Sexually Active: Not Currently    Birth Control/ Protection: Post-menopausal   Other Topics Concern  . Not on file   Social History Narrative  . No narrative on file    Family History  Problem Relation Age of Onset  . Asthma Father     ROS:  As stated in the HPI and negative for all other systems.  Physical Exam: Blood pressure 125/55, pulse 57, temperature 98.1 F (36.7 C), temperature source Oral, resp. rate 20, height 5\' 5"  (1.651 m), weight 235 lb (106.595 kg), SpO2 98.00%.  GENERAL:  Well appearing HEENT:  Pupils equal round and reactive, fundi not visualized, oral mucosa unremarkable NECK:  No jugular venous distention, waveform within normal limits, carotid upstroke brisk and symmetric, no bruits, no thyromegaly LYMPHATICS:  No cervical, inguinal adenopathy LUNGS:  Clear to  auscultation bilaterally BACK:  No CVA tenderness CHEST:  Unremarkable HEART:  PMI not displaced or sustained,S1 and S2 within normal limits, no S3, no S4, no clicks, no rubs, no murmurs ABD:  Flat, positive bowel sounds normal in frequency in pitch, no bruits, no rebound, no guarding, no midline pulsatile mass, no hepatomegaly, no splenomegaly EXT:  2 plus pulses throughout, no edema, no cyanosis no clubbing SKIN:  No rashes no nodules NEURO:  Cranial nerves II through XII grossly intact, motor grossly intact throughout PSYCH:  Cognitively intact, oriented to person place and time  Labs: Lab Results  Component Value Date   BUN 5* 12/15/2011   Lab Results  Component Value Date   CREATININE 0.70 12/15/2011   Lab Results  Component Value Date   NA 142 12/15/2011   K 4.0 12/15/2011   CL 102 12/15/2011   No results found for this basename: CKTOTAL, CKMB, CKMBINDEX, TROPONINI   Lab Results  Component Value Date   WBC 12.4* 12/15/2011   HGB 15.0 12/15/2011   HCT 44.0 12/15/2011   MCV 90.6 12/15/2011   PLT 227 12/15/2011      Radiology:  CXR:  No acute abnormality  EKG:  NSR:  Rate 52, axis WNL, no acute ST T wave changes.  12/16/2011  ASSESSMENT AND PLAN:    Arm pain - The complaint is somewhat atypical but identical to her previous angina. Therefore, the pretest probability of obstructive coronary disease is very high. Will be admitted. Treated with IV heparin, nitroglycerin paste, aspirin and her other medicines as listed. She'll have cardiac catheterization. The patient understands that risks included but are not limited to stroke (1 in 1000), death (1 in 1000), kidney failure [usually temporary] (1 in 500), bleeding (1 in 200), allergic reaction [possibly serious] (1 in 200).  The patient understands and agrees to proceed.    Tobacco abuse - She is still smoking a few cigarettes. She would like to try Chantix again.   Dyslipidemia -  A lipid profile was at target in March. She  will continue the meds as listed.   HTN -  She will continue the meds as listed.  SignedRollene Rotunda 12/16/2011, 12:19 AM

## 2011-12-16 NOTE — ED Provider Notes (Signed)
History     CSN: [id number]  Arrival date & time 12/15/11  2159   First MD Initiated Contact with Patient 12/15/11 2307      Chief Complaint  Patient presents with  . Wrist Pain    Feels the same as when she had a 9% blockage of her lad  . Chest Pain    (Consider location/radiation/quality/duration/timing/severity/associated sxs/prior treatment) HPI 56 year old female presents to emergency apartment complaining of left chest pain left wrist pain. Patient reports pain has been going on for last 3-5 days and is worsening. Patient with history of RCA blockage in January, with stent placed by Dr. Riley Kill, and reports her symptoms she had prior to that stent are the same as she's having currently. Patient is still smoking. She denies any orthopnea or PND. She does report some dyspnea on exertion and worsening pain with long walks to the parking lot. She denies any nausea, no diaphoresis. She does report she has been extremely fatigued over the last week.   Past Medical History  Diagnosis Date  . Diabetes mellitus, undetermined   . Hypertension   . Diverticulitis   . Obesity   . GERD (gastroesophageal reflux disease)   . CAD (coronary artery disease)     s/p IVUS-guided DES to RCA 05/26/11 following abnormal stress test  . Dyslipidemia     Past Surgical History  Procedure Date  . Cholecystectomy   . Tubal ligation   . Foot surgery   . Laparoscopic gastric banding   . Coronary angioplasty with stent placement     LAD 05/2011    Family History  Problem Relation Age of Onset  . Asthma Father   . Emphysema Father 17  . Coronary artery disease Mother 46    History  Substance Use Topics  . Smoking status: Current Everyday Smoker -- 0.5 packs/day for 40 years    Types: Cigarettes  . Smokeless tobacco: Not on file  . Alcohol Use: Yes     occasionally    OB History    Grav Para Term Preterm Abortions TAB SAB Ect Mult Living                  Review of Systems  All  other systems reviewed and are negative.    Allergies  Ciprofloxacin and Sulfa antibiotics  Home Medications   Current Outpatient Rx  Name Route Sig Dispense Refill  . AMLODIPINE BESYLATE 5 MG PO TABS Oral Take 5 mg by mouth daily.    . ASPIRIN 81 MG PO TBEC Oral Take 81 mg by mouth daily.      . ATENOLOL 100 MG PO TABS Oral Take 100 mg by mouth daily.      Marland Kitchen CLOPIDOGREL BISULFATE 75 MG PO TABS Oral Take 1 tablet (75 mg total) by mouth daily with breakfast. 90 tablet 3  . HYDROCHLOROTHIAZIDE 25 MG PO TABS Oral Take 25 mg by mouth daily.      Marland Kitchen LOSARTAN POTASSIUM 100 MG PO TABS Oral Take 100 mg by mouth daily.    Marland Kitchen NITROGLYCERIN 0.4 MG SL SUBL Sublingual Place 0.4 mg under the tongue every 5 (five) minutes as needed. For chest pain    . ROSUVASTATIN CALCIUM 5 MG PO TABS Oral Take 5 mg by mouth 3 (three) times a week. Monday, Wednesday and Saturday    . SERTRALINE HCL 50 MG PO TABS Oral Take 50 mg by mouth daily.      BP 125/55  Pulse 57  Temp  98.1 F (36.7 C) (Oral)  Resp 20  Ht 5\' 5"  (1.651 m)  Wt 235 lb (106.595 kg)  BMI 39.11 kg/m2  SpO2 98%  Physical Exam  Nursing note and vitals reviewed. Constitutional: She is oriented to person, place, and time. She appears well-developed and well-nourished. No distress.       Obese female in no acute distress  HENT:  Head: Normocephalic and atraumatic.  Nose: Nose normal.  Mouth/Throat: Oropharynx is clear and moist.  Eyes: Conjunctivae and EOM are normal. Pupils are equal, round, and reactive to light.  Neck: Normal range of motion. Neck supple. No JVD present. No tracheal deviation present. No thyromegaly present.  Cardiovascular: Normal rate, regular rhythm, normal heart sounds and intact distal pulses.  Exam reveals no gallop and no friction rub.   No murmur heard. Pulmonary/Chest: Effort normal and breath sounds normal. No stridor. No respiratory distress. She has no wheezes. She has no rales. She exhibits no tenderness.    Abdominal: Soft. Bowel sounds are normal. She exhibits no distension and no mass. There is no tenderness. There is no rebound and no guarding.  Musculoskeletal: Normal range of motion. She exhibits no edema and no tenderness.  Lymphadenopathy:    She has no cervical adenopathy.  Neurological: She is alert and oriented to person, place, and time. She has normal reflexes. She exhibits normal muscle tone. Coordination normal.  Skin: Skin is warm and dry. No rash noted. She is not diaphoretic. No erythema. No pallor.  Psychiatric: She has a normal mood and affect. Her behavior is normal. Judgment and thought content normal.    ED Course  Procedures (including critical care time)  Labs Reviewed  CBC - Abnormal; Notable for the following:    WBC 12.4 (*)     All other components within normal limits  POCT I-STAT, CHEM 8 - Abnormal; Notable for the following:    BUN 5 (*)     Glucose, Bld 117 (*)     All other components within normal limits  POCT I-STAT TROPONIN I   Dg Chest 2 View  12/15/2011  *RADIOLOGY REPORT*  Clinical Data: Chest pain  CHEST - 2 VIEW  Comparison: 09/10/2011  Findings: Heart size is upper normal.  Negative for heart failure. Lungs are clear without infiltrate or effusion.  Slightly prominent lung markings may represent chronic lung disease.  IMPRESSION: No acute abnormality.   Original Report Authenticated By: Camelia Phenes, M.D.     Date: 12/15/2011  Rate: 52  Rhythm: sinus bradycardia  QRS Axis: normal  Intervals: normal  ST/T Wave abnormalities: normal  Conduction Disutrbances:none  Narrative Interpretation: t wave reversal resolved  Old EKG Reviewed: changes noted    1. Chest pain   2. Tobacco abuse   3. Obesity   4. Hypertension   5. Diabetes mellitus, stable       MDM  56 year old female with known coronary disease he was having chest pain and arm pain similar to her prior ischemic event. Discussed with Dr. Antoine Poche who will see the patient in  the emergency department.       Olivia Mackie, MD 12/16/11 [phone]

## 2011-12-16 NOTE — Progress Notes (Signed)
Discussed smoking cessation.  Pt smokes about 1/2 ppd and has tried to quit unsuccessfully in past.  Identified motivation factors for quitting, discussed tips for success, encouragement provided.  Pt states "I guess it's time to quit."

## 2011-12-16 NOTE — Progress Notes (Addendum)
For cath study today.  PCI in 04/2011 that was IVUS guided.   Labs in order except K is low.  Will replace.  Ran out of at home and did not take. Patient agreeable.  Unfortunately, still smoking.

## 2011-12-17 LAB — CBC
Hemoglobin: 12.6 g/dL (ref 12.0–15.0)
MCV: 91.2 fL (ref 78.0–100.0)
Platelets: 176 10*3/uL (ref 150–400)
RBC: 4.22 MIL/uL (ref 3.87–5.11)
WBC: 9.2 10*3/uL (ref 4.0–10.5)

## 2011-12-17 LAB — BASIC METABOLIC PANEL
BUN: 8 mg/dL (ref 6–23)
Chloride: 106 mEq/L (ref 96–112)
GFR calc Af Amer: >90 mL/min (ref >90)
Glucose, Bld: 175 mg/dL — ABNORMAL HIGH (ref 70–99)
Potassium: 4.1 mEq/L (ref 3.5–5.1)

## 2011-12-17 NOTE — Discharge Summary (Signed)
Discharge Summary   Patient ID: Karina Oneal MRN: [id number], DOB/AGE: 1955/12/10 56 y.o.  Primary MD: Daisy Floro, MD Primary Cardiologist: Dr. Tenny Craw Admit date: 12/15/2011 D/C date:     12/17/2011      Primary Discharge Diagnoses:  1. Unstable Angina/CAD  - H/o IVUS-guided DES to RCA 05/26/11  - Cath 12/16/11 revealed single vessel CAD in the prox LAD & widely patent RCA stent, s/p DES to prox LAD  - DAPT and aggressive risk factor modification   2. Hypokalemia 3. Tobacco Abuse  Secondary Discharge Diagnoses:  1. Diabetes mellitus, undetermined (Borderline)   2. Hypertension  3. Dyslipidemia 4. Obesity   5. GERD    6. Diverticulitis   7. Cholecystectomy   8. Tubal ligation   9. Foot surgery   10. Laparoscopic gastric banding     Allergies Allergies  Allergen Reactions  . Atorvastatin Other (See Comments)    Muscle weakness.  Takes crestor 3x week without problem  . Ciprofloxacin Nausea And Vomiting  . Lisinopril Cough  . Sulfa Antibiotics Itching    Diagnostic Studies/Procedures:   12/16/11 - Cardiac Cath Hemodynamics:  AO 154/86 with a mean of 110 mmHg  Coronary angiography:  Coronary dominance: right  Left mainstem: Normal  Left anterior descending (LAD): There is a 70-80% fusiform lesion in the proximal LAD with moderate calcification.  Left circumflex (LCx): There are minor irregularities less than 10%.  Right coronary artery (RCA): The right coronary is a large dominant vessel. The stent in the proximal vessel is widely patent without significant disease.  Left ventriculography: Not performed  PCI Data:  Vessel - LAD/Segment - proximal  Percent Stenosis (pre) 70- 80%, abnormal fractional flow reserve  TIMI-flow 3  Stent 3.5 x 18 mm Xience Xpedition  Percent Stenosis (post) 0%  TIMI-flow (post) 3  Final Conclusions:  1. Single vessel obstructive atherosclerotic coronary disease involving the proximal LAD. This lesion is hemodynamically  significant by fractional flow reserve. The prior stent in the right coronary is widely patent.  2. Successful stenting of the proximal LAD with a drug-eluting stent.  Recommendations:  Continue dual antiplatelet therapy for least one more year. Aggressive risk factor modification.   History of Present Illness: 56 y.o. female w/ the above medical problems who presented to [facility] on 12/16/11 with complaints of left arm/wrist pain. The patient reported one week of left arm pain, predominantly in her left wrist, that was similar to the pain she had prior to her PCI in January 2013.   Hospital Course: In the ED, EKG revealed NSR with no acute ST/T changes. CXR was without acute cardiopulmonary abnormalities. Labs were significant for WBC 12.4, unremarkable BMET. She was admitted for further evaluation and treatment.   Cardiac enzymes were cycled and remained negative. Given her cardiac history and symptoms concerning for her anginal equivalent it was felt she would benefit from cardiac cath. Cath revealed single vessel CAD in the prox LAD and widely patent RCA stent with successful placement of DES to prox LAD. She tolerated the procedure well without complications. Recommendations were made for at least one year of DAPT, tobacco cessation, and aggressive risk factor modification.  Cath site remained stable. She was seen and evaluated by Dr. Riley Kill who felt she was stable for discharge home with plans for follow up as scheduled below.  Discharge Vitals: Blood pressure 143/63, pulse 53, temperature 97.9 F (36.6 C), temperature source Oral, resp. rate 16, height 5\' 5"  (1.651 m), weight 231  lb 4.2 oz (104.9 kg), SpO2 95.00%.  Labs: Component Value Date   WBC 9.2 12/17/2011   HGB 12.6 12/17/2011   HCT 38.5 12/17/2011   MCV 91.2 12/17/2011   PLT 176 12/17/2011    Lab 12/17/11 0500 12/16/11 0328  NA 144 --  K 4.1 --  CL 106 --  CO2 28 --  BUN 8 --  CREATININE 0.52 --  CALCIUM 9.0 --    PROT -- 6.4  BILITOT -- 0.3  ALKPHOS -- 101  ALT -- 19  AST -- 17  GLUCOSE 175* --   Basename 12/16/11 1449 12/16/11 0838 12/16/11 0328  CKTOTAL 75 77 74  CKMB 1.7 1.9 1.8  TROPONINI <0.30 <0.30 <0.30    Discharge Medications   Medication List  As of 12/17/2011 12:12 PM   TAKE these medications         amLODipine 5 MG tablet   Commonly known as: NORVASC   Take 5 mg by mouth daily.      aspirin 81 MG EC tablet   Take 81 mg by mouth daily.      atenolol 100 MG tablet   Commonly known as: TENORMIN   Take 100 mg by mouth daily.      clopidogrel 75 MG tablet   Commonly known as: PLAVIX   Take 1 tablet (75 mg total) by mouth daily with breakfast.      hydrochlorothiazide 25 MG tablet   Commonly known as: HYDRODIURIL   Take 25 mg by mouth daily.      losartan 100 MG tablet   Commonly known as: COZAAR   Take 100 mg by mouth daily.      nitroGLYCERIN 0.4 MG SL tablet   Commonly known as: NITROSTAT   Place 0.4 mg under the tongue every 5 (five) minutes as needed. For chest pain      rosuvastatin 5 MG tablet   Commonly known as: CRESTOR   Take 5 mg by mouth 3 (three) times a week. Monday, Wednesday and Saturday      sertraline 50 MG tablet   Commonly known as: ZOLOFT   Take 50 mg by mouth daily.            Disposition   Discharge Orders    Future Appointments: Provider: Department: Dept Phone: Center:   01/01/2012 8:30 AM Beatrice Lecher, PA [facility] [phone] [id number]     Future Orders Please Complete By Expires   Amb Referral to Cardiac Rehabilitation      Diet - low sodium heart healthy      Increase activity slowly      Discharge instructions      Comments:   * KEEP WRIST CATHETERIZATION SITE CLEAN AND DRY. Call the office for any signs of bleeding, pus, swelling, increased pain, or any other concerns. * NO HEAVY LIFTING (>10lbs) OR SEXUAL ACTIVITY X 7 DAYS. * NO DRIVING X 3-5 DAYS. * NO SOAKING BATHS, HOT TUBS, POOLS, ETC., X 7  DAYS.  * Please stop smoking!     Follow-up Information    Follow up with Tereso Newcomer, PA on 01/01/2012. (8:30)    Contact information:   [facility] [address] [place] [address] [phone]        Follow up with Daisy Floro, MD. (As needed)    Contact information:   [address] [place] [address] [phone]           Outstanding  Labs/Studies:  None  Duration of Discharge Encounter: Greater than 30 minutes including physician and PA time.  Signed, Edyn Qazi PA-C 12/17/2011, 12:12 PM

## 2011-12-17 NOTE — Progress Notes (Signed)
[id number] Cardiac Rehab Pt states that she has ambulated in hall, denies any cp or SOB. Completed discharge education with pt. and daughter. Pt agrees to Outpt. CRP in [place], will send referral. Strongly encouraged smoking cessation with pt and given smoking cessation tips. Also gave pt telephone numbers for  coaching line.

## 2011-12-17 NOTE — Progress Notes (Signed)
Subjective:  Findings noted.  Discussed in detail with patient.    Objective:  Vital Signs in the last 24 hours: Temp:  [97.6 F (36.4 C)-98.3 F (36.8 C)] 97.9 F (36.6 C) (08/22 0802) Pulse Rate:  [48-56] 53  (08/22 0802) Resp:  [13-22] 16  (08/22 0802) BP: (142-163)/(59-90) 143/63 mmHg (08/22 0802) SpO2:  [93 %-99 %] 95 % (08/22 0802) Weight:  [231 lb 4.2 oz (104.9 kg)] 231 lb 4.2 oz (104.9 kg) (08/22 0400)  Intake/Output from previous day: 08/21 0701 - 08/22 0700 In: 453.2 [P.O.:240; I.V.:213.2] Out: 600 [Urine:600]   Physical Exam: General: Well developed, well nourished, in no acute distress. Head:  Normocephalic and atraumatic. Pulses: Pulses normal in all 4 extremities.  Radial looks good.   Extremities: No clubbing or cyanosis. No edema. Neurologic: Alert and oriented x 3.    Lab Results:  Basename 12/17/11 0500 12/16/11 0328  WBC 9.2 11.3*  HGB 12.6 13.4  PLT 176 198    Basename 12/17/11 0500 12/16/11 0328  NA 144 142  K 4.1 3.2*  CL 106 103  CO2 28 30  GLUCOSE 175* 232*  BUN 8 7  CREATININE 0.52 0.44*    Basename 12/16/11 1449 12/16/11 0838  TROPONINI <0.30 <0.30   Hepatic Function Panel  Basename 12/16/11 0328  PROT 6.4  ALBUMIN 3.3*  AST 17  ALT 19  ALKPHOS 101  BILITOT 0.3  BILIDIR --  IBILI --   No results found for this basename: CHOL in the last 72 hours No results found for this basename: PROTIME in the last 72 hours  Imaging: Dg Chest 2 View  12/15/2011  *RADIOLOGY REPORT*  Clinical Data: Chest pain  CHEST - 2 VIEW  Comparison: 09/10/2011  Findings: Heart size is upper normal.  Negative for heart failure. Lungs are clear without infiltrate or effusion.  Slightly prominent lung markings may represent chronic lung disease.  IMPRESSION: No acute abnormality.   Original Report Authenticated By: Camelia Phenes, M.D.     EKG:  SB.  Otherwise normal.    Cardiac Studies:    Assessment/Plan:  Patient Active Hospital Problem  List: Hypokalemia (12/16/2011)   Assessment: resolved   Plan: needs to take.  Ran out at home USAP    SP PCI    Reviewed in detail.  DAPT for one year.    DC smoking    Early follow up with primary MD.        [name], MD, [facility], [facility] 12/17/2011, 9:28 AM

## 2012-01-01 ENCOUNTER — Ambulatory Visit (INDEPENDENT_AMBULATORY_CARE_PROVIDER_SITE_OTHER): Payer: 59 | Admitting: Physician Assistant

## 2012-01-01 ENCOUNTER — Encounter: Payer: Self-pay | Admitting: Physician Assistant

## 2012-01-01 VITALS — BP 120/86 | HR 47 | Ht 65.0 in | Wt 235.8 lb

## 2012-01-01 DIAGNOSIS — E669 Obesity, unspecified: Secondary | ICD-10-CM

## 2012-01-01 DIAGNOSIS — Z720 Tobacco use: Secondary | ICD-10-CM

## 2012-01-01 DIAGNOSIS — I2510 Atherosclerotic heart disease of native coronary artery without angina pectoris: Secondary | ICD-10-CM

## 2012-01-01 DIAGNOSIS — F17200 Nicotine dependence, unspecified, uncomplicated: Secondary | ICD-10-CM

## 2012-01-01 DIAGNOSIS — I10 Essential (primary) hypertension: Secondary | ICD-10-CM

## 2012-01-01 NOTE — Progress Notes (Addendum)
[address] [place], [place]  [address] Phone: [phone] Fax:  [phone]  Date:  01/01/2012   Name:  Karina Oneal   DOB:  1956-03-12   MRN:  [id number]  PCP:  Daisy Floro, MD  Primary Cardiologist:  Dr. Dietrich Pates  Primary Electrophysiologist:  None    History of Present Illness: Karina Oneal is a 56 y.o. female who returns for post hospital followup.  She has a history of CAD, status post DES to the RCA in 1/13, preserved LV function, HTN, HL, borderline DM2. She was admitted 8/20-8/22 with left arm pain consistent with her previous angina. Myocardial infarction was ruled out. LHC demonstrated a patent stent in the RCA but a high-grade lesion in the proximal LAD that was hemodynamically significant by FFR. She underwent PCI with placement of a Xience DES to the proximal LAD.  She is doing well. The patient denies chest pain, shortness of breath, syncope, orthopnea, PND or significant pedal edema. No further arm and wrist pain.     Past Medical History  Diagnosis Date  . Diabetes mellitus, undetermined   . Hypertension   . Diverticulitis   . Obesity   . GERD (gastroesophageal reflux disease)   . CAD (coronary artery disease)     a.  PCI 1/13: s/p IVUS-guided DES to RCA;   b. LHC 8/13: pLAD 70-80%, irregs in CFX, pRCA stent patent; FFR abnormal at LAD lesion ==> PCI: Xence Xpedition DES to pLAD ;  c.  EF 66% by nuclear study 1/13 and EF 55-65% by [facility] 1/13  . Dyslipidemia     Current Outpatient Prescriptions  Medication Sig Dispense Refill  . amLODipine (NORVASC) 5 MG tablet Take 5 mg by mouth daily.      Marland Kitchen aspirin 81 MG EC tablet Take 81 mg by mouth daily.        Marland Kitchen atenolol (TENORMIN) 100 MG tablet Take 100 mg by mouth daily.        Marland Kitchen buPROPion (WELLBUTRIN SR) 150 MG 12 hr tablet Take 150 mg by mouth daily.      . clopidogrel (PLAVIX) 75 MG tablet Take 1 tablet (75 mg total) by mouth daily with breakfast.  90 tablet  3  . hydrochlorothiazide 25  MG tablet Take 25 mg by mouth daily.        Marland Kitchen losartan (COZAAR) 100 MG tablet Take 100 mg by mouth daily.      . nitroGLYCERIN (NITROSTAT) 0.4 MG SL tablet Place 0.4 mg under the tongue every 5 (five) minutes as needed. For chest pain      . rosuvastatin (CRESTOR) 5 MG tablet Take 5 mg by mouth 3 (three) times a week. Monday, Wednesday and Saturday      . sertraline (ZOLOFT) 50 MG tablet Take 50 mg by mouth daily.        Allergies: Allergies  Allergen Reactions  . Atorvastatin Other (See Comments)    Muscle weakness.  Takes crestor 3x week without problem  . Ciprofloxacin Nausea And Vomiting  . Lisinopril Cough  . Sulfa Antibiotics Itching    History  Substance Use Topics  . Smoking status: Current Everyday Smoker -- 0.5 packs/day for 40 years    Types: Cigarettes  . Smokeless tobacco: Not on file  . Alcohol Use: Yes     occasionally    PHYSICAL EXAM: VS:  BP 120/86  Pulse 47  Ht 5\' 5"  (1.651 m)  Wt 235 lb 12.8 oz (106.958  kg)  BMI 39.24 kg/m2 Well nourished, well developed, in no acute distress HEENT: normal Neck: no JVD Cardiac:  normal S1, S2; RRR; no murmur Lungs:  clear to auscultation bilaterally, no wheezing, rhonchi or rales Abd: soft, nontender, no hepatomegaly Ext: no edema; right wrist without hematoma or bruit  Skin: warm and dry Neuro:  CNs 2-12 intact, no focal abnormalities noted  EKG:  Sinus bradycardia, heart rate 47, normal axis, no acute changes      ASSESSMENT AND PLAN:  1. Coronary Artery Disease:  Doing well post PCI of her LAD.  We discussed the importance of dual antiplatelet therapy. She is not interested in cardiac rehab.  Continue current Rx.  Follow up with Dr. Dietrich Pates in 3 mos.    2. Tobacco Abuse:  We discussed the importance of cessation and different strategies for quitting.     3. Hypertension:  Controlled.  Continue current therapy.  4. Hyperlipidemia:  Managed by PCP.   5. Obesity:  We discussed the importance of weight loss.    6. Bradycardia: Asymptomatic. Continue current dose of beta blocker.  Luna Glasgow, PA-C  8:42 AM 01/01/2012

## 2012-01-01 NOTE — Patient Instructions (Addendum)
Your physician recommends that you schedule a follow-up appointment in 3 months with Dr.Ross  

## 2012-01-13 NOTE — Discharge Summary (Signed)
Patient seen and evaluated, and is stable for discharge.  She will need follow up in the [facility]iology clinic.  Need for DAPT is reviewed.  TS

## 2012-04-04 ENCOUNTER — Ambulatory Visit: Payer: 59 | Admitting: Internal Medicine

## 2012-04-08 ENCOUNTER — Encounter: Payer: Self-pay | Admitting: Physician Assistant

## 2012-04-08 ENCOUNTER — Ambulatory Visit (INDEPENDENT_AMBULATORY_CARE_PROVIDER_SITE_OTHER): Payer: 59 | Admitting: Physician Assistant

## 2012-04-08 VITALS — BP 144/78 | HR 66 | Ht 65.0 in | Wt 243.0 lb

## 2012-04-08 DIAGNOSIS — I2510 Atherosclerotic heart disease of native coronary artery without angina pectoris: Secondary | ICD-10-CM

## 2012-04-08 DIAGNOSIS — E785 Hyperlipidemia, unspecified: Secondary | ICD-10-CM

## 2012-04-08 DIAGNOSIS — Z720 Tobacco use: Secondary | ICD-10-CM

## 2012-04-08 DIAGNOSIS — R079 Chest pain, unspecified: Secondary | ICD-10-CM

## 2012-04-08 DIAGNOSIS — I10 Essential (primary) hypertension: Secondary | ICD-10-CM

## 2012-04-08 DIAGNOSIS — F17200 Nicotine dependence, unspecified, uncomplicated: Secondary | ICD-10-CM

## 2012-04-08 MED ORDER — OMEPRAZOLE 20 MG PO CPDR: 20.0000 mg | DELAYED_RELEASE_CAPSULE | Freq: Every day | ORAL | Status: DC

## 2012-04-08 NOTE — Progress Notes (Signed)
[address] [place], [place]  [address] Phone: [phone]; Fax:  [phone]  Date:  04/08/2012   Name:  Karina Oneal   DOB:  10-18-1955   MRN:  [id number]  PCP:  Daisy Floro, MD  Primary Cardiologist:  Dr. Dietrich Pates  Primary Electrophysiologist:  None    History of Present Illness: Karina Oneal is a 56 y.o. female who returns for follow up.  She has a hx of CAD, s/p DES to the RCA in 1/13, preserved LV function, HTN, HL, borderline DM2. She was admitted 11/2011 with [place].  LHC demonstrated a patent stent in the RCA but a high-grade lesion in the proximal LAD that was hemodynamically significant by FFR. She underwent PCI with placement of a Xience DES to the pLAD.  I saw her in followup 9/13.  Since then, she notes occasional chest/epigastric pain.  It is sharp and brief.  No relation to meals.  No dysphagia or odynophagia.  She denies any dyspnea.  No syncope or near syncope.  No orthopnea, PND, edema.  She notes some left wrist pain.  She thinks it may be from a trigger finger (thumb).  It has been injected before.  She has been wearing a splint with some improvement.    Labs (3/13):   LDL 81 Labs (8/13):   K 4.1, creatinine 0.52, ALT 19, Hgb 12.6  Wt Readings from Last 3 Encounters:  04/08/12 243 lb (110.224 kg)  01/01/12 235 lb 12.8 oz (106.958 kg)  12/17/11 231 lb 4.2 oz (104.9 kg)     Past Medical History  Diagnosis Date  . Diabetes mellitus, undetermined   . Hypertension   . Diverticulitis   . Obesity   . GERD (gastroesophageal reflux disease)   . CAD (coronary artery disease)     a.  PCI 1/13: s/p IVUS-guided DES to RCA;   b. LHC 8/13: pLAD 70-80%, irregs in CFX, pRCA stent patent; FFR abnormal at LAD lesion ==> PCI: Xence Xpedition DES to pLAD ;  c.  EF 66% by nuclear study 1/13 and EF 55-65% by [facility] 1/13  . Dyslipidemia     Current Outpatient Prescriptions  Medication Sig Dispense Refill  . amLODipine (NORVASC) 5 MG tablet Take 5 mg  by mouth daily.      Marland Kitchen aspirin 81 MG EC tablet Take 81 mg by mouth daily.        Marland Kitchen atenolol (TENORMIN) 100 MG tablet Take 100 mg by mouth daily.        Marland Kitchen buPROPion (WELLBUTRIN SR) 150 MG 12 hr tablet Take 150 mg by mouth daily.      . clopidogrel (PLAVIX) 75 MG tablet Take 1 tablet (75 mg total) by mouth daily with breakfast.  90 tablet  3  . hydrochlorothiazide 25 MG tablet Take 25 mg by mouth daily.        Marland Kitchen losartan (COZAAR) 100 MG tablet Take 100 mg by mouth daily.      . nitroGLYCERIN (NITROSTAT) 0.4 MG SL tablet Place 0.4 mg under the tongue every 5 (five) minutes as needed. For chest pain      . rosuvastatin (CRESTOR) 5 MG tablet Take 5 mg by mouth 3 (three) times a week. Monday, Wednesday and Saturday      . sertraline (ZOLOFT) 50 MG tablet Take 50 mg by mouth daily.        Allergies: Allergies  Allergen Reactions  . Atorvastatin Other (See Comments)    Muscle  weakness.  Takes crestor 3x week without problem  . Ciprofloxacin Nausea And Vomiting  . Lisinopril Cough  . Sulfa Antibiotics Itching   Social History:   The patient  reports that she has been smoking Cigarettes.  She has a 20 pack-year smoking history. She does not have any smokeless tobacco history on file. She reports that she drinks alcohol. She reports that she does not use illicit drugs.  ROS:   See HPI.  No melena, hematochezia, vomiting, diarrhea.  All other systems reviewed and negatives.     PHYSICAL EXAM: VS:  BP 144/78  Pulse 66  Ht 5\' 5"  (1.651 m)  Wt 243 lb (110.224 kg)  BMI 40.44 kg/m2 Well nourished, well developed, in no acute distress HEENT: normal Neck: no JVD Vascular:  No carotid bruits Cardiac:  normal S1, S2; RRR; no murmur Lungs:  clear to auscultation bilaterally, no wheezing, rhonchi or rales Abd: soft, nontender, no hepatomegaly Ext: no edema  Skin: warm and dry Neuro:  CNs 2-12 intact, no focal abnormalities noted  EKG:  NSR, HR 66, normal axis, NSSTTW changes, no change from prior  tracings    ASSESSMENT AND PLAN:  1. Chest Pain:   Symptoms are atypical.  She has more epigastric pain than anything.  Not clearly GI.  Also has left wrist pain.  Anginal equivalent in the past has been wrist pain.  But, this seems to be related to her trigger thumb.  It is getting better with wearing a splint.  I have suggested she resume taking Omeprazole 20 mg QD.  She will keep wearing her splint.  She will follow up with her hand surgeon.  If her wrist symptoms persist or worsen or she has more symptoms reminiscent of her angina, she will return for sooner follow up.  Consider stress testing at that time.  2. Coronary Artery Disease:  Continue ASA and Plavix.   3. Tobacco Abuse:  We discussed the importance of cessation.  She is not ready to quit.   4. Hypertension:  Controlled.  BPs at home are better and she has not taken medications yet today.  Continue current therapy.  5. Hyperlipidemia:  Managed by PCP.   6. Disposition:   Follow up with me or Dr. Dietrich Pates in 6 weeks.  Signed, Tereso Newcomer, PA-C  8:59 AM 04/08/2012

## 2012-04-08 NOTE — Patient Instructions (Signed)
**Note De-Identified Marcela Alatorre Obfuscation** Your physician has recommended you make the following change in your medication: start taking Omeprazole 20 mg daily  Your physician recommends that you schedule a follow-up appointment in: 6 weeks

## 2012-05-23 ENCOUNTER — Ambulatory Visit (INDEPENDENT_AMBULATORY_CARE_PROVIDER_SITE_OTHER): Payer: 59 | Admitting: Physician Assistant

## 2012-05-23 ENCOUNTER — Encounter: Payer: Self-pay | Admitting: Physician Assistant

## 2012-05-23 ENCOUNTER — Telehealth: Payer: Self-pay | Admitting: *Deleted

## 2012-05-23 VITALS — BP 134/68 | HR 52 | Ht 65.0 in | Wt 236.4 lb

## 2012-05-23 DIAGNOSIS — I10 Essential (primary) hypertension: Secondary | ICD-10-CM

## 2012-05-23 DIAGNOSIS — I2510 Atherosclerotic heart disease of native coronary artery without angina pectoris: Secondary | ICD-10-CM

## 2012-05-23 DIAGNOSIS — E785 Hyperlipidemia, unspecified: Secondary | ICD-10-CM

## 2012-05-23 DIAGNOSIS — Z720 Tobacco use: Secondary | ICD-10-CM

## 2012-05-23 DIAGNOSIS — F17200 Nicotine dependence, unspecified, uncomplicated: Secondary | ICD-10-CM

## 2012-05-23 LAB — BASIC METABOLIC PANEL
BUN: 10 mg/dL (ref 6–23)
GFR: 110.13 mL/min (ref >60.00)
Potassium: 3.7 mEq/L (ref 3.5–5.1)
Sodium: 138 mEq/L (ref 135–145)

## 2012-05-23 NOTE — Telephone Encounter (Signed)
pt notified about lab results with verbal understanding  

## 2012-05-23 NOTE — Patient Instructions (Addendum)
Your physician recommends that you return for lab work in: BMET  Your physician wants you to follow-up in: 6 MONTHS WITH DR. Tenny Craw. You will receive a reminder letter in the mail two months in advance. If you don't receive a letter, please call our office to schedule the follow-up appointment.  NO CHANGES WERE MADE TODAY

## 2012-05-23 NOTE — Progress Notes (Signed)
[address] [place], [place]  [address] Phone: [phone], Fax:  [phone]  Date:  05/23/2012   ID:  Karina Oneal, DOB Sep 05, 1955, MRN [id number]  PCP:  Daisy Floro, MD  Primary Cardiologist:  Dr. Dietrich Pates     History of Present Illness: Karina Oneal is a 57 y.o. female who returns for follow up on chest pain.  She has a hx of CAD, s/p DES to the RCA in 1/13, preserved LV function, HTN, HL, borderline DM2. She was admitted 11/2011 with [place]. LHC demonstrated a patent stent in the RCA but a high-grade lesion in the proximal LAD that was hemodynamically significant by FFR. She underwent PCI with placement of a Xience DES to the pLAD. I saw her in followup last month. She noted occasional chest/epigastric pain.  She also noted some left wrist pain. She thought it might be from a trigger finger (thumb). It has been injected before. She has been wearing a splint with some improvement. Her symptoms were somewhat atypical. Her anginal equivalent the past was wrist pain. I placed on a PPI and had her return today for followup. Since being seen, she denies any further symptoms. She admits to increased stressors. She's had some stress relief and thinks that her symptoms were related to this. Her wrist feels much better. She denies chest pain, shortness of breath, syncope, orthopnea, PND or edema.  Labs (3/13): LDL 81  Labs (8/13): K 4.1, creatinine 0.52, ALT 19, Hgb 12.6  Wt Readings from Last 3 Encounters:  05/23/12 236 lb 6.4 oz (107.23 kg)  04/08/12 243 lb (110.224 kg)  01/01/12 235 lb 12.8 oz (106.958 kg)     Past Medical History  Diagnosis Date  . Diabetes mellitus, undetermined   . Hypertension   . Diverticulitis   . Obesity   . GERD (gastroesophageal reflux disease)   . CAD (coronary artery disease)     a.  PCI 1/13: s/p IVUS-guided DES to RCA;   b. LHC 8/13: pLAD 70-80%, irregs in CFX, pRCA stent patent; FFR abnormal at LAD lesion ==> PCI: Xence  Xpedition DES to pLAD ;  c.  EF 66% by nuclear study 1/13 and EF 55-65% by [facility] 1/13  . Dyslipidemia     Current Outpatient Prescriptions  Medication Sig Dispense Refill  . amLODipine (NORVASC) 5 MG tablet Take 5 mg by mouth daily.      Marland Kitchen aspirin 81 MG EC tablet Take 81 mg by mouth daily.        Marland Kitchen atenolol (TENORMIN) 100 MG tablet Take 100 mg by mouth daily.        Marland Kitchen buPROPion (WELLBUTRIN SR) 150 MG 12 hr tablet Take 150 mg by mouth daily.      . clopidogrel (PLAVIX) 75 MG tablet Take 1 tablet (75 mg total) by mouth daily with breakfast.  90 tablet  3  . hydrochlorothiazide 25 MG tablet Take 25 mg by mouth daily.        Marland Kitchen losartan (COZAAR) 100 MG tablet Take 100 mg by mouth daily.      . nitroGLYCERIN (NITROSTAT) 0.4 MG SL tablet Place 0.4 mg under the tongue every 5 (five) minutes as needed. For chest pain      . rosuvastatin (CRESTOR) 5 MG tablet Take 5 mg by mouth 3 (three) times a week. Monday, Wednesday and Saturday      . sertraline (ZOLOFT) 50 MG tablet Take 50 mg by mouth daily.  Allergies:    Allergies  Allergen Reactions  . Atorvastatin Other (See Comments)    Muscle weakness.  Takes crestor 3x week without problem  . Ciprofloxacin Nausea And Vomiting  . Lisinopril Cough  . Sulfa Antibiotics Itching    Social History:  The patient  reports that she has been smoking Cigarettes.  She has a 20 pack-year smoking history. She does not have any smokeless tobacco history on file. She reports that she drinks alcohol. She reports that she does not use illicit drugs.   ROS:  Please see the history of present illness.   She notes dry skin.   All other systems reviewed and negative.   PHYSICAL EXAM: VS:  BP 134/68  Pulse 52  Ht 5\' 5"  (1.651 m)  Wt 236 lb 6.4 oz (107.23 kg)  BMI 39.34 kg/m2 Well nourished, well developed, in no acute distress HEENT: normal Neck: no JVD Cardiac:  normal S1, S2; RRR; no murmur Lungs:  clear to auscultation bilaterally, no wheezing, rhonchi  or rales Abd: soft, nontender, no hepatomegaly Ext: no edema Skin: warm and dry Neuro:  CNs 2-12 intact, no focal abnormalities noted  EKG:  Sinus bradycardia, HR 52, normal axis, no acute changes     ASSESSMENT AND PLAN:  1. Coronary Artery Disease:  Overall stable. She denies any further chest pain. This was likely related to stress. Continue aspirin Plavix. 2. Hypertension:  Controlled. Continue current therapy. Check a basic metabolic panel today. 3. Hyperlipidemia:  Followed by primary care. 4. Tobacco Abuse:  She is still considering quitting.  5. Disposition:  Followup with Dr. Tenny Craw in 6 months.  Signed, Tereso Newcomer, PA-C  9:12 AM 05/23/2012

## 2012-05-23 NOTE — Telephone Encounter (Signed)
Message copied by Tarri Fuller on Mon May 23, 2012  5:49 PM ------      Message from: [place], [place] T      Created: Mon May 23, 2012  5:24 PM       Potassium and kidney function look good.      Continue with current treatment plan.      Tereso Newcomer, PA-C  4:49 PM 03/10/2012

## 2012-06-11 ENCOUNTER — Other Ambulatory Visit: Payer: Self-pay

## 2012-07-21 ENCOUNTER — Other Ambulatory Visit: Payer: Self-pay | Admitting: Family Medicine

## 2012-07-21 ENCOUNTER — Other Ambulatory Visit (HOSPITAL_COMMUNITY)
Admission: RE | Admit: 2012-07-21 | Discharge: 2012-07-21 | Disposition: A | Discharge status: Home / Self Care | Payer: 59 | Source: Ambulatory Visit | Attending: Family Medicine | Admitting: Family Medicine

## 2012-07-21 DIAGNOSIS — Z124 Encounter for screening for malignant neoplasm of cervix: Secondary | ICD-10-CM | POA: Insufficient documentation

## 2012-11-02 ENCOUNTER — Encounter (HOSPITAL_BASED_OUTPATIENT_CLINIC_OR_DEPARTMENT_OTHER): Payer: Self-pay | Admitting: Emergency Medicine

## 2012-11-02 ENCOUNTER — Emergency Department (HOSPITAL_BASED_OUTPATIENT_CLINIC_OR_DEPARTMENT_OTHER): Payer: 59

## 2012-11-02 ENCOUNTER — Inpatient Hospital Stay (HOSPITAL_BASED_OUTPATIENT_CLINIC_OR_DEPARTMENT_OTHER)
Admission: EM | Admit: 2012-11-02 | Discharge: 2012-11-06 | DRG: 195 | Disposition: A | Discharge status: Home / Self Care | Payer: 59 | Attending: Internal Medicine | Admitting: Internal Medicine

## 2012-11-02 DIAGNOSIS — E876 Hypokalemia: Secondary | ICD-10-CM

## 2012-11-02 DIAGNOSIS — F329 Major depressive disorder, single episode, unspecified: Secondary | ICD-10-CM | POA: Diagnosis present

## 2012-11-02 DIAGNOSIS — K219 Gastro-esophageal reflux disease without esophagitis: Secondary | ICD-10-CM | POA: Diagnosis present

## 2012-11-02 DIAGNOSIS — E669 Obesity, unspecified: Secondary | ICD-10-CM | POA: Diagnosis present

## 2012-11-02 DIAGNOSIS — Z720 Tobacco use: Secondary | ICD-10-CM

## 2012-11-02 DIAGNOSIS — D72829 Elevated white blood cell count, unspecified: Secondary | ICD-10-CM | POA: Diagnosis present

## 2012-11-02 DIAGNOSIS — I10 Essential (primary) hypertension: Secondary | ICD-10-CM | POA: Diagnosis present

## 2012-11-02 DIAGNOSIS — F3289 Other specified depressive episodes: Secondary | ICD-10-CM | POA: Diagnosis present

## 2012-11-02 DIAGNOSIS — Z8719 Personal history of other diseases of the digestive system: Secondary | ICD-10-CM

## 2012-11-02 DIAGNOSIS — F17200 Nicotine dependence, unspecified, uncomplicated: Secondary | ICD-10-CM | POA: Diagnosis present

## 2012-11-02 DIAGNOSIS — Z79899 Other long term (current) drug therapy: Secondary | ICD-10-CM

## 2012-11-02 DIAGNOSIS — T380X5A Adverse effect of glucocorticoids and synthetic analogues, initial encounter: Secondary | ICD-10-CM | POA: Diagnosis present

## 2012-11-02 DIAGNOSIS — E118 Type 2 diabetes mellitus with unspecified complications: Secondary | ICD-10-CM | POA: Diagnosis present

## 2012-11-02 DIAGNOSIS — E785 Hyperlipidemia, unspecified: Secondary | ICD-10-CM | POA: Diagnosis present

## 2012-11-02 DIAGNOSIS — J189 Pneumonia, unspecified organism: Principal | ICD-10-CM | POA: Diagnosis present

## 2012-11-02 DIAGNOSIS — I2510 Atherosclerotic heart disease of native coronary artery without angina pectoris: Secondary | ICD-10-CM | POA: Diagnosis present

## 2012-11-02 HISTORY — DX: Chronic obstructive pulmonary disease, unspecified: J44.9

## 2012-11-02 HISTORY — DX: Pneumonia, unspecified organism: J18.9

## 2012-11-02 HISTORY — DX: Angina pectoris, unspecified: I20.9

## 2012-11-02 LAB — COMPREHENSIVE METABOLIC PANEL
ALT: 40 U/L — ABNORMAL HIGH (ref 0–35)
AST: 36 U/L (ref 0–37)
Alkaline Phosphatase: 164 U/L — ABNORMAL HIGH (ref 39–117)
CO2: 19 mEq/L (ref 19–32)
Chloride: 97 mEq/L (ref 96–112)
Creatinine, Ser: 0.6 mg/dL (ref 0.50–1.10)
GFR calc non Af Amer: >90 mL/min (ref >90)
Sodium: 133 mEq/L — ABNORMAL LOW (ref 135–145)
Total Bilirubin: 0.8 mg/dL (ref 0.3–1.2)

## 2012-11-02 LAB — CBC WITH DIFFERENTIAL/PLATELET
Basophils Absolute: 0 10*3/uL (ref 0.0–0.1)
HCT: 39 % (ref 36.0–46.0)
Hemoglobin: 13 g/dL (ref 12.0–15.0)
Lymphocytes Relative: 8 % — ABNORMAL LOW (ref 12–46)
Monocytes Relative: 8 % (ref 3–12)
Neutro Abs: 18.6 10*3/uL — ABNORMAL HIGH (ref 1.7–7.7)
RDW: 13.2 % (ref 11.5–15.5)
WBC: 22.2 10*3/uL — ABNORMAL HIGH (ref 4.0–10.5)

## 2012-11-02 MED ORDER — PIPERACILLIN-TAZOBACTAM 3.375 G IVPB 30 MIN
3.3750 g | Freq: Once | INTRAVENOUS | Status: AC
  Administered 2012-11-02: 3.375 g via INTRAVENOUS
  Filled 2012-11-02 (×2): qty 50

## 2012-11-02 MED ORDER — IPRATROPIUM BROMIDE 0.02 % IN SOLN
RESPIRATORY_TRACT | Status: AC
  Administered 2012-11-02: 0.5 mg via RESPIRATORY_TRACT
  Filled 2012-11-02: qty 2.5

## 2012-11-02 MED ORDER — ALBUTEROL SULFATE (5 MG/ML) 0.5% IN NEBU
INHALATION_SOLUTION | RESPIRATORY_TRACT | Status: AC
  Filled 2012-11-02: qty 1

## 2012-11-02 MED ORDER — IPRATROPIUM BROMIDE 0.02 % IN SOLN
0.5000 mg | Freq: Once | RESPIRATORY_TRACT | Status: AC
  Administered 2012-11-02: 0.5 mg via RESPIRATORY_TRACT

## 2012-11-02 MED ORDER — VANCOMYCIN HCL IN DEXTROSE 1-5 GM/200ML-% IV SOLN
1000.0000 mg | Freq: Once | INTRAVENOUS | Status: AC
  Administered 2012-11-02: 1000 mg via INTRAVENOUS
  Filled 2012-11-02: qty 200

## 2012-11-02 MED ORDER — ALBUTEROL SULFATE (5 MG/ML) 0.5% IN NEBU
5.0000 mg | INHALATION_SOLUTION | Freq: Once | RESPIRATORY_TRACT | Status: AC
  Administered 2012-11-02: 5 mg via RESPIRATORY_TRACT

## 2012-11-02 MED ORDER — IPRATROPIUM BROMIDE 0.02 % IN SOLN
RESPIRATORY_TRACT | Status: AC
  Filled 2012-11-02: qty 2.5

## 2012-11-02 MED ORDER — METHYLPREDNISOLONE SODIUM SUCC 125 MG IJ SOLR
125.0000 mg | Freq: Once | INTRAMUSCULAR | Status: AC
  Administered 2012-11-02: 125 mg via INTRAVENOUS
  Filled 2012-11-02: qty 2

## 2012-11-02 NOTE — ED Notes (Signed)
Report given to [facility]-awaiting return call for rpeort from 2000 RN

## 2012-11-02 NOTE — Progress Notes (Signed)
PENDING ACCEPTANCE TRANFER NOTE:  Call received from:   Dr Ranae Palms of [facility].  REASON FOR REQUESTING TRANSFER: Having bilateral PNA and shortness of breath with wheezing.  HPI:  57 yo Tx outpatient with Omicef, presents with increasing wheezing, bilateral PNAs and leukocytosis.  She is having bilateral infiltrates on CXR.  She was given nebs, IV steroid, and broad spectrum IV antibiotics.  He doesn't feel she needs SDU.     PLAN:  According to telephone report, this patient was accepted for transfer to telemetry,   Under [facility] team:  [facility],  I have requested an order be written to call Flow Manager at [phone] upon patient arrival to the floor for final physician assignment who will do the admission and give admitting orders.  SIGNEDHouston Siren, MD [facility]  11/02/2012, 8:35 PM

## 2012-11-02 NOTE — ED Provider Notes (Signed)
History    CSN: [id number] Arrival date & time 11/02/12  1901  First MD Initiated Contact with Patient 11/02/12 1907     Chief Complaint  Patient presents with  . Cough   (Consider location/radiation/quality/duration/timing/severity/associated sxs/prior Treatment) HPI Pt diagnosed with pneumonia > 2 weeks ago by PMD. She has been on omnicef and then avelox for the past 5 days. She has had increased SOB, esp with exertion. She had subjective fevers and chills last night. +persistent cough with yellow thick sputum production. +wheezing. Pt with > 40 yr smoking history. No lower ext swelling or pain. No recent travel. Pt is not on home O2. Past Medical History  Diagnosis Date  . Diabetes mellitus, undetermined   . Hypertension   . Diverticulitis   . Obesity   . GERD (gastroesophageal reflux disease)   . CAD (coronary artery disease)     a.  PCI 1/13: s/p IVUS-guided DES to RCA;   b. LHC 8/13: pLAD 70-80%, irregs in CFX, pRCA stent patent; FFR abnormal at LAD lesion ==> PCI: Xence Xpedition DES to pLAD ;  c.  EF 66% by nuclear study 1/13 and EF 55-65% by [facility] 1/13  . Dyslipidemia    Past Surgical History  Procedure Laterality Date  . Cholecystectomy    . Tubal ligation    . Foot surgery    . Laparoscopic gastric banding    . Coronary angioplasty with stent placement      LAD 05/2011   Family History  Problem Relation Age of Onset  . Asthma Father   . Emphysema Father 4  . Coronary artery disease Mother 3   History  Substance Use Topics  . Smoking status: Current Every Day Smoker -- 0.50 packs/day for 40 years    Types: Cigarettes  . Smokeless tobacco: Not on file  . Alcohol Use: Yes     Comment: occasionally   OB History   Grav Para Term Preterm Abortions TAB SAB Ect Mult Living                 Review of Systems  Constitutional: Positive for fever, chills and fatigue.  HENT: Negative for neck pain.   Respiratory: Positive for cough, shortness of breath and  wheezing.   Cardiovascular: Negative for chest pain and leg swelling.  Gastrointestinal: Negative for nausea, vomiting and abdominal pain.  Genitourinary: Negative for dysuria.  Musculoskeletal: Negative for back pain.  Skin: Negative for rash and wound.  Neurological: Positive for weakness. Negative for dizziness, light-headedness, numbness and headaches.  All other systems reviewed and are negative.    Allergies  Atorvastatin; Ciprofloxacin; Lisinopril; and Sulfa antibiotics  Home Medications   Current Outpatient Rx  Name  Route  Sig  Dispense  Refill  . moxifloxacin (AVELOX) 400 MG tablet   Oral   Take 400 mg by mouth daily.         Marland Kitchen amLODipine (NORVASC) 5 MG tablet   Oral   Take 5 mg by mouth daily.         Marland Kitchen aspirin 81 MG EC tablet   Oral   Take 81 mg by mouth daily.           Marland Kitchen atenolol (TENORMIN) 100 MG tablet   Oral   Take 100 mg by mouth daily.           Marland Kitchen buPROPion (WELLBUTRIN SR) 150 MG 12 hr tablet   Oral   Take 150 mg by mouth daily.         Marland Kitchen  hydrochlorothiazide 25 MG tablet   Oral   Take 25 mg by mouth daily.           Marland Kitchen losartan (COZAAR) 100 MG tablet   Oral   Take 100 mg by mouth daily.         . nitroGLYCERIN (NITROSTAT) 0.4 MG SL tablet   Sublingual   Place 0.4 mg under the tongue every 5 (five) minutes as needed. For chest pain         . rosuvastatin (CRESTOR) 5 MG tablet   Oral   Take 5 mg by mouth 3 (three) times a week. Monday, Wednesday and Saturday         . sertraline (ZOLOFT) 50 MG tablet   Oral   Take 50 mg by mouth daily.          BP 149/68  Pulse 85  Temp(Src) 98.7 F (37.1 C) (Oral)  Resp 24  SpO2 91% Physical Exam  Nursing note and vitals reviewed. Constitutional: She is oriented to person, place, and time. She appears well-developed and well-nourished. No distress.  HENT:  Head: Normocephalic and atraumatic.  Mouth/Throat: Oropharynx is clear and moist.  Eyes: EOM are normal. Pupils are equal,  round, and reactive to light.  Neck: Normal range of motion. Neck supple.  Cardiovascular: Normal rate and regular rhythm.   Pulmonary/Chest: Effort normal. No respiratory distress. She has wheezes. She has rales.  End exp wheezes and course rhonchi bl lung fields  Abdominal: Soft. Bowel sounds are normal. She exhibits no distension and no mass. There is no tenderness. There is no rebound and no guarding.  Musculoskeletal: Normal range of motion. She exhibits no edema and no tenderness.  No calf swelling or tenderness  Neurological: She is alert and oriented to person, place, and time.  5/5 motor in all ext, sensation grossly intact, normal gait  Skin: Skin is warm and dry. No rash noted. No erythema.  Psychiatric: She has a normal mood and affect. Her behavior is normal.    ED Course  Procedures (including critical care time) Labs Reviewed  CBC WITH DIFFERENTIAL - Abnormal; Notable for the following:    WBC 22.2 (*)    Neutrophils Relative % 84 (*)    Lymphocytes Relative 8 (*)    Neutro Abs 18.6 (*)    Monocytes Absolute 1.8 (*)    All other components within normal limits  COMPREHENSIVE METABOLIC PANEL - Abnormal; Notable for the following:    Sodium 133 (*)    Glucose, Bld 210 (*)    Albumin 2.8 (*)    ALT 40 (*)    Alkaline Phosphatase 164 (*)    All other components within normal limits   Dg Chest 2 View  11/02/2012   *RADIOLOGY REPORT*  Clinical Data: Cough, congestion.  CHEST - 2 VIEW  Comparison: 10/28/2012  Findings: Patchy bilateral airspace opacities are noted and new since prior study.  These most pronounced in the right base and left upper lobe but also noted elsewhere throughout both lungs. Findings concerning for multifocal pneumonia.  Heart is normal size.  No effusions.  No acute bony abnormality.  IMPRESSION: Patchy bilateral airspace opacities concerning for multifocal pneumonia.   Original Report Authenticated By: Charlett Nose, M.D.   1. Recurrent pneumonia      Date: 11/02/2012  Rate: 84  Rhythm: normal sinus rhythm  QRS Axis: normal  Intervals: normal  ST/T Wave abnormalities: normal  Conduction Disutrbances:none  Narrative Interpretation:   Old EKG Reviewed:  unchanged   MDM  Pt with O2 sats in high 80's on RA. Up to 91-92% on 2 L.  Pt feeling mildly better after nebs. Discussed with Dr Conley Rolls who accepted pt in transfer. Broad spectrum abx started due to failure of 2 different oral abx.   Loren Racer, MD 11/02/12 2037

## 2012-11-02 NOTE — ED Notes (Signed)
Report given to Dillon Bjork 2000 nurse

## 2012-11-02 NOTE — ED Notes (Signed)
[facility] here for transport.  

## 2012-11-02 NOTE — ED Notes (Signed)
Pt with persistent cough x 3 weeks. Previous dx of pneumonia x 2.

## 2012-11-02 NOTE — ED Notes (Signed)
Just finished omnicef and prednisone

## 2012-11-03 ENCOUNTER — Encounter (HOSPITAL_COMMUNITY): Payer: Self-pay | Admitting: *Deleted

## 2012-11-03 DIAGNOSIS — J189 Pneumonia, unspecified organism: Secondary | ICD-10-CM | POA: Diagnosis present

## 2012-11-03 DIAGNOSIS — D72829 Elevated white blood cell count, unspecified: Secondary | ICD-10-CM | POA: Diagnosis present

## 2012-11-03 LAB — BASIC METABOLIC PANEL
BUN: 8 mg/dL (ref 6–23)
Chloride: 96 mEq/L (ref 96–112)
Creatinine, Ser: 0.56 mg/dL (ref 0.50–1.10)
GFR calc Af Amer: >90 mL/min (ref >90)

## 2012-11-03 LAB — CBC
HCT: 37.1 % (ref 36.0–46.0)
MCHC: 34.2 g/dL (ref 30.0–36.0)
MCV: 89.4 fL (ref 78.0–100.0)
RDW: 13.3 % (ref 11.5–15.5)
WBC: 23.2 10*3/uL — ABNORMAL HIGH (ref 4.0–10.5)

## 2012-11-03 MED ORDER — AMLODIPINE BESYLATE 5 MG PO TABS
5.0000 mg | ORAL_TABLET | Freq: Every day | ORAL | Status: DC
  Administered 2012-11-03 – 2012-11-06 (×4): 5 mg via ORAL
  Filled 2012-11-03 (×4): qty 1

## 2012-11-03 MED ORDER — IPRATROPIUM BROMIDE 0.02 % IN SOLN
0.5000 mg | RESPIRATORY_TRACT | Status: DC | PRN
  Administered 2012-11-04: 0.5 mg via RESPIRATORY_TRACT
  Filled 2012-11-03: qty 2.5

## 2012-11-03 MED ORDER — INSULIN ASPART 100 UNIT/ML ~~LOC~~ SOLN: 0.0000 [IU] | Freq: Every day | SUBCUTANEOUS | Status: DC

## 2012-11-03 MED ORDER — BUPROPION HCL ER (SR) 150 MG PO TB12
150.0000 mg | ORAL_TABLET | Freq: Every day | ORAL | Status: DC
  Administered 2012-11-03 – 2012-11-06 (×4): 150 mg via ORAL
  Filled 2012-11-03 (×4): qty 1

## 2012-11-03 MED ORDER — ASPIRIN EC 81 MG PO TBEC
81.0000 mg | DELAYED_RELEASE_TABLET | Freq: Every day | ORAL | Status: DC
  Administered 2012-11-03 – 2012-11-06 (×4): 81 mg via ORAL
  Filled 2012-11-03 (×4): qty 1

## 2012-11-03 MED ORDER — DEXTROSE 5 % IV SOLN
500.0000 mg | INTRAVENOUS | Status: DC
  Administered 2012-11-03 – 2012-11-06 (×4): 500 mg via INTRAVENOUS
  Filled 2012-11-03 (×4): qty 500

## 2012-11-03 MED ORDER — INSULIN ASPART 100 UNIT/ML ~~LOC~~ SOLN
0.0000 [IU] | Freq: Three times a day (TID) | SUBCUTANEOUS | Status: DC
Start: 2012-11-03 — End: 2012-11-06
  Administered 2012-11-03: 15 [IU] via SUBCUTANEOUS
  Administered 2012-11-04: 3 [IU] via SUBCUTANEOUS
  Administered 2012-11-04: 07:00:00 via SUBCUTANEOUS
  Administered 2012-11-04 – 2012-11-06 (×6): 3 [IU] via SUBCUTANEOUS

## 2012-11-03 MED ORDER — HEPARIN SODIUM (PORCINE) 5000 UNIT/ML IJ SOLN
5000.0000 [IU] | Freq: Three times a day (TID) | INTRAMUSCULAR | Status: DC
  Administered 2012-11-04 – 2012-11-05 (×3): 5000 [IU] via SUBCUTANEOUS
  Filled 2012-11-03 (×13): qty 1

## 2012-11-03 MED ORDER — ATENOLOL 100 MG PO TABS
100.0000 mg | ORAL_TABLET | Freq: Every day | ORAL | Status: DC
  Administered 2012-11-03 – 2012-11-06 (×4): 100 mg via ORAL
  Filled 2012-11-03 (×4): qty 1

## 2012-11-03 MED ORDER — SERTRALINE HCL 50 MG PO TABS
50.0000 mg | ORAL_TABLET | Freq: Every day | ORAL | Status: DC
Start: 2012-11-03 — End: 2012-11-06
  Administered 2012-11-03 – 2012-11-06 (×4): 50 mg via ORAL
  Filled 2012-11-03 (×4): qty 1

## 2012-11-03 MED ORDER — VANCOMYCIN HCL IN DEXTROSE 1-5 GM/200ML-% IV SOLN
1000.0000 mg | Freq: Two times a day (BID) | INTRAVENOUS | Status: DC
  Administered 2012-11-03 – 2012-11-05 (×5): 1000 mg via INTRAVENOUS
  Filled 2012-11-03 (×7): qty 200

## 2012-11-03 MED ORDER — LOSARTAN POTASSIUM 50 MG PO TABS
100.0000 mg | ORAL_TABLET | Freq: Every day | ORAL | Status: DC
  Administered 2012-11-03 – 2012-11-06 (×4): 100 mg via ORAL
  Filled 2012-11-03 (×4): qty 2

## 2012-11-03 MED ORDER — DEXTROSE 5 % IV SOLN
1.0000 g | Freq: Three times a day (TID) | INTRAVENOUS | Status: DC
  Administered 2012-11-03 – 2012-11-06 (×10): 1 g via INTRAVENOUS
  Filled 2012-11-03 (×12): qty 1

## 2012-11-03 MED ORDER — CLOPIDOGREL BISULFATE 75 MG PO TABS
75.0000 mg | ORAL_TABLET | Freq: Every day | ORAL | Status: DC
  Administered 2012-11-03 – 2012-11-06 (×4): 75 mg via ORAL
  Filled 2012-11-03 (×5): qty 1

## 2012-11-03 MED ORDER — ALBUTEROL SULFATE (5 MG/ML) 0.5% IN NEBU
2.5000 mg | INHALATION_SOLUTION | RESPIRATORY_TRACT | Status: DC | PRN
  Administered 2012-11-04: 2.5 mg via RESPIRATORY_TRACT
  Filled 2012-11-03: qty 0.5

## 2012-11-03 MED ORDER — GUAIFENESIN ER 600 MG PO TB12
600.0000 mg | ORAL_TABLET | Freq: Two times a day (BID) | ORAL | Status: DC | PRN
  Administered 2012-11-03: 1200 mg via ORAL
  Administered 2012-11-04: 600 mg via ORAL
  Filled 2012-11-03 (×2): qty 2

## 2012-11-03 NOTE — Progress Notes (Signed)
ANTIBIOTIC CONSULT NOTE - INITIAL  Pharmacy Consult for vancomycin Indication: rule out pneumonia  Allergies  Allergen Reactions  . Atorvastatin Other (See Comments)    Muscle weakness.  Takes crestor 3x week without problem  . Ciprofloxacin Nausea And Vomiting  . Lisinopril Cough  . Sulfa Antibiotics Itching    Patient Measurements: Height: 5\' 4"  (162.6 cm) Weight: 231 lb 3.2 oz (104.872 kg) IBW/kg (Calculated) : 54.7  Vital Signs: Temp: 98.3 F (36.8 C) (07/09 2250) Temp src: Oral (07/09 2250) BP: 144/66 mmHg (07/09 2250) Pulse Rate: 98 (07/09 2250)  Labs:  Recent Labs  11/02/12 1921  WBC 22.2*  HGB 13.0  PLT 241  CREATININE 0.60   Estimated Creatinine Clearance: 93.8 ml/min (by C-G formula based on Cr of 0.6).    Microbiology: No results found for this or any previous visit (from the past 720 hour(s)).  Medical History: Past Medical History  Diagnosis Date  . Diabetes mellitus, undetermined   . Hypertension   . Diverticulitis   . Obesity   . GERD (gastroesophageal reflux disease)   . CAD (coronary artery disease)     a.  PCI 1/13: s/p IVUS-guided DES to RCA;   b. LHC 8/13: pLAD 70-80%, irregs in CFX, pRCA stent patent; FFR abnormal at LAD lesion ==> PCI: Xence Xpedition DES to pLAD ;  c.  EF 66% by nuclear study 1/13 and EF 55-65% by [facility] 1/13  . Dyslipidemia     Medications:  Prescriptions prior to admission  Medication Sig Dispense Refill  . moxifloxacin (AVELOX) 400 MG tablet Take 400 mg by mouth daily.      Marland Kitchen amLODipine (NORVASC) 5 MG tablet Take 5 mg by mouth daily.      Marland Kitchen aspirin 81 MG EC tablet Take 81 mg by mouth daily.        Marland Kitchen atenolol (TENORMIN) 100 MG tablet Take 100 mg by mouth daily.        Marland Kitchen buPROPion (WELLBUTRIN SR) 150 MG 12 hr tablet Take 150 mg by mouth daily.      . hydrochlorothiazide 25 MG tablet Take 25 mg by mouth daily.        Marland Kitchen losartan (COZAAR) 100 MG tablet Take 100 mg by mouth daily.      . nitroGLYCERIN (NITROSTAT) 0.4  MG SL tablet Place 0.4 mg under the tongue every 5 (five) minutes as needed. For chest pain      . rosuvastatin (CRESTOR) 5 MG tablet Take 5 mg by mouth 3 (three) times a week. Monday, Wednesday and Saturday      . sertraline (ZOLOFT) 50 MG tablet Take 50 mg by mouth daily.       Scheduled:  . amLODipine  5 mg Oral Daily  . aspirin EC  81 mg Oral Daily  . atenolol  100 mg Oral Daily  . azithromycin  500 mg Intravenous Q24H  . buPROPion  150 mg Oral Daily  . ceFEPime (MAXIPIME) IV  1 g Intravenous Q8H  . heparin  5,000 Units Subcutaneous Q8H  . losartan  100 mg Oral Daily  . sertraline  50 mg Oral Daily  . vancomycin  1,000 mg Intravenous Q12H    Assessment: 57yo female had been Dx'd w/ PNA 2wk ago by PCP, was on Omnicef x10d without improvement, then switched to Avelox x5d, now w/ Sx somewhat improved though persistent, CXR concerning for multi-focal PNA, to begin IV ABX.  Goal of Therapy:  Vancomycin trough level 15-20 mcg/ml  Plan:  Will begin vancomycin 1000mg  IV Q12H and monitor CBC, Cx, levels prn.  Vernard Gambles, PharmD, BCPS  11/03/2012,2:29 AM

## 2012-11-03 NOTE — Progress Notes (Addendum)
Inpatient Diabetes Program Recommendations  AACE/ADA: New Consensus Statement on Inpatient Glycemic Control (2013)  Target Ranges:  Prepandial:   less than 140 mg/dL      Peak postprandial:   less than 180 mg/dL (1-2 hours)      Critically ill patients:  140 - 180 mg/dL   Reason for Visit: Hyperglycemia  Results for AHMIA, COLFORD (MRN [id number]) as of 11/03/2012 14:34  Ref. Range 11/02/2012 19:21 11/03/2012 02:55  Sodium Latest Range: 135-145 mEq/L 133 (L) 131 (L)  Potassium Latest Range: 3.5-5.1 mEq/L 4.3 3.7  Chloride Latest Range: 96-112 mEq/L 97 96  CO2 Latest Range: 19-32 mEq/L 19 23  BUN Latest Range: 6-23 mg/dL 8 8  Creatinine Latest Range: 0.50-1.10 mg/dL 4.09 8.11  Calcium Latest Range: 8.4-10.5 mg/dL 9.2 8.7  GFR calc non Af Amer Latest Range: >90 mL/min >90 >90  GFR calc Af Amer Latest Range: >90 mL/min >90 >90  Glucose Latest Range: 70-99 mg/dL 914 (H) 782 (H)     Recommendations:  Check HgbA1C to assess glycemic control prior to hospitalization Add Novolog moderate tidwc and hs Change diet to CHO-mod med  Will continue to follow.  Thank you. Ailene Ards, RD, LDN, CDE Inpatient Diabetes Coordinator [phone]

## 2012-11-03 NOTE — H&P (Signed)
[facility] History and Physical  Karina Oneal:[id number] DOB: Jun 13, 1955 DOA: 11/02/2012  Referring physician: ED PCP: Daisy Floro, MD    Chief Complaint: Cough  HPI: Karina Oneal is a 57 y.o. female who presents to the ED at [facility] after being diagnosed with PNA > 2 weeks ago by her PCP.  She was put on omnicef for 10 days which didn't do a thing to improve her symptoms then switched to avelox for these past 5 days.  Symptoms were somewhat improved with avelox but have been persistant.  She has had increased SOB and DOE.  Subjective fevers and chills last night.  Persistent productive cough with thick yellow quality sputum.  Has wheezing with a 40+ PY h/o smoking.  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  Review of Systems: 12 systems reviewed and otherwise negative.  Past Medical History  Diagnosis Date  . Diabetes mellitus, undetermined   . Hypertension   . Diverticulitis   . Obesity   . GERD (gastroesophageal reflux disease)   . CAD (coronary artery disease)     a.  PCI 1/13: s/p IVUS-guided DES to RCA;   b. LHC 8/13: pLAD 70-80%, irregs in CFX, pRCA stent patent; FFR abnormal at LAD lesion ==> PCI: Xence Xpedition DES to pLAD ;  c.  EF 66% by nuclear study 1/13 and EF 55-65% by [facility] 1/13  . Dyslipidemia    Past Surgical History  Procedure Laterality Date  . Cholecystectomy    . Tubal ligation    . Foot surgery    . Laparoscopic gastric banding    . Coronary angioplasty with stent placement      LAD 05/2011   Social History:  reports that she has been smoking Cigarettes.  She has a 20 pack-year smoking history. She does not have any smokeless tobacco history on file. She reports that  drinks alcohol. She reports that she does not use illicit drugs.   Allergies  Allergen Reactions  . Atorvastatin Other (See Comments)    Muscle weakness.  Takes crestor 3x  week without problem  . Ciprofloxacin Nausea And Vomiting  . Lisinopril Cough  . Sulfa Antibiotics Itching    Family History  Problem Relation Age of Onset  . Asthma Father   . Emphysema Father 55  . Coronary artery disease Mother 46     Prior to Admission medications   Medication Sig Start Date End Date Taking? Authorizing Provider  moxifloxacin (AVELOX) 400 MG tablet Take 400 mg by mouth daily.   Yes Historical Provider, MD  amLODipine (NORVASC) 5 MG tablet Take 5 mg by mouth daily.    Historical Provider, MD  aspirin 81 MG EC tablet Take 81 mg by mouth daily.      Historical Provider, MD  atenolol (TENORMIN) 100 MG tablet Take 100 mg by mouth daily.      Historical Provider, MD  buPROPion (WELLBUTRIN SR) 150 MG 12 hr tablet Take 150 mg by mouth daily.    Historical Provider, MD  hydrochlorothiazide 25 MG tablet Take 25 mg by mouth daily.      Historical Provider, MD  losartan (COZAAR) 100 MG tablet Take 100 mg by mouth daily.    Historical Provider, MD  nitroGLYCERIN (NITROSTAT) 0.4 MG SL tablet Place 0.4 mg under the tongue every 5 (five) minutes as needed. For chest pain    Historical  Provider, MD  rosuvastatin (CRESTOR) 5 MG tablet Take 5 mg by mouth 3 (three) times a week. Monday, Wednesday and Saturday    Historical Provider, MD  sertraline (ZOLOFT) 50 MG tablet Take 50 mg by mouth daily.    Historical Provider, MD   Physical Exam: Filed Vitals:   11/02/12 1925 11/02/12 2003 11/02/12 2101 11/02/12 2250  BP:   137/55 144/66  Pulse: 85  91 98  Temp:   98.5 F (36.9 C) 98.3 F (36.8 C)  TempSrc:   Oral Oral  Resp: 24  20 18   Height:    5\' 4"  (1.626 m)  Weight:    104.872 kg (231 lb 3.2 oz)  SpO2: 91% 91% 90%     General:  NAD, resting comfortably in bed Eyes: PEERLA EOMI ENT: mucous membranes moist Neck: supple w/o JVD Cardiovascular: RRR w/o MRG Respiratory: bilateral faint wheezes and rhonchi in all lung fields Abdomen: soft, nt, nd, bs+ Skin: no rash nor  lesion Musculoskeletal: MAE, full ROM all 4 extremities Psychiatric: normal tone and affect Neurologic: AAOx3, grossly non-focal  Labs on Admission:  Basic Metabolic Panel:  Recent Labs Lab 11/02/12 1921  NA 133*  K 4.3  CL 97  CO2 19  GLUCOSE 210*  BUN 8  CREATININE 0.60  CALCIUM 9.2   Liver Function Tests:  Recent Labs Lab 11/02/12 1921  AST 36  ALT 40*  ALKPHOS 164*  BILITOT 0.8  PROT 7.4  ALBUMIN 2.8*   No results found for this basename: LIPASE, AMYLASE,  in the last 168 hours No results found for this basename: AMMONIA,  in the last 168 hours CBC:  Recent Labs Lab 11/02/12 1921  WBC 22.2*  NEUTROABS 18.6*  HGB 13.0  HCT 39.0  MCV 92.0  PLT 241   Cardiac Enzymes: No results found for this basename: CKTOTAL, CKMB, CKMBINDEX, TROPONINI,  in the last 168 hours  BNP (last 3 results) No results found for this basename: PROBNP,  in the last 8760 hours CBG: No results found for this basename: GLUCAP,  in the last 168 hours  Radiological Exams on Admission: Dg Chest 2 View  11/02/2012   *RADIOLOGY REPORT*  Clinical Data: Cough, congestion.  CHEST - 2 VIEW  Comparison: 10/28/2012  Findings: Patchy bilateral airspace opacities are noted and new since prior study.  These most pronounced in the right base and left upper lobe but also noted elsewhere throughout both lungs. Findings concerning for multifocal pneumonia.  Heart is normal size.  No effusions.  No acute bony abnormality.  IMPRESSION: Patchy bilateral airspace opacities concerning for multifocal pneumonia.   Original Report Authenticated By: Charlett Nose, M.D.    EKG: Independently reviewed.  Assessment/Plan Principal Problem:   HCAP (healthcare-associated pneumonia) Active Problems:   Leukocytosis, unspecified   1. 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  Got zosyn and vanc in ED, treating now with cefepime, vanc, and azithromycin.  Given her presentation however and general non-toxic appearance, I would guess that this is more likely an atypical organism such as mycoplasma (orderd IgM for mycoplasma) causing a "walking pneumonia" with a diffuse multilobar presentation.  Although patient was concerned for influenza, I have not heard that his is currently going around the area and it would be very unusual at this time of year (Early July). 2.  Leukocytosis - secondary to #1, no other SIRS criteria at this point.    Code Status: Full Code (must indicate code status--if unknown or must be presumed, indicate so) Family Communication: No family in room (indicate person spoken with, if applicable, with phone number if by telephone) Disposition Plan: Admit to inpatient (indicate anticipated LOS)  Time spent: 70 min  Herlinda Heady M. [facility] Pager [phone]  If 7PM-7AM, please contact night-coverage [email or web address] Password [email or web address] 11/03/2012, 2:05 AM

## 2012-11-03 NOTE — Progress Notes (Signed)
UR Completed.  Karina Oneal, [place] [phone] 11/03/2012

## 2012-11-03 NOTE — Progress Notes (Addendum)
[facility] PROGRESS NOTE  LAVERE SHINSKY ZOX:[id number] DOB: Nov 05, 1955 DOA: 11/02/2012 PCP: Daisy Floro, MD  Brief History 57 year old female with a history of diet controlled diabetes mellitus, hypertension, hyperlipidemia, COPD with continued tobacco use presented with four-day history of worsening shortness of breath. The patient was previously treated with Omnicef for 10 days by her primary care physician. The patient also received a short course of prednisone which she finished on 10/26/2012. There was no significant improvement. The patient was started on moxifloxacin which she finished a five-day course. She was only slightly better. In emergency department, the patient was found to have chest x-ray with bilateral multifocal infiltrates, WBC 23.2 with hypoxemia and wheezing. The patient received one dose of methylprednisolone, 125 mg IV. The patient was started on vancomycin, cefepime, azithromycin. She states that she quit smoking 15 days ago. Previously smoked one to 2 packs per day x40 years. Patient denies weight gain, orthopnea, PND, worsening peripheral edema. Assessment/Plan: HCAP -Continue empiric vancomycin, cefepime, azithromycin -Urine Streptococcus pneumoniae antigen negative -Follow blood cultures Diabetes mellitus type 2 -Hemoglobin A1c 7.1 in February 2014 -Repeat hemoglobin A1c -Place patient on NovoLog sliding scale as her sugars are significantly elevated due to steroids Coronary artery disease -Status post drug-eluting stent to RCA 05/26/2011, drug-eluting stent to the proximal LAD 12/16/2011 -Continue aspirin and Plavix Hypertension -Continue losartan and atenolol and amlodipine Depression -Continue Zoloft COPD -Bronchodilators when necessary shortness of breath -No further wheezing after one dose of solumedrol   Family Communication:   Pt at beside Disposition Plan:   Home when medically stable   Antibiotics:  Vancomycin  11/02/2012>>>  Cefepime 11/02/2012>>>  Azithromycin 11/02/2012>>>    Procedures/Studies: Dg Chest 2 View  11/02/2012   *RADIOLOGY REPORT*  Clinical Data: Cough, congestion.  CHEST - 2 VIEW  Comparison: 10/28/2012  Findings: Patchy bilateral airspace opacities are noted and new since prior study.  These most pronounced in the right base and left upper lobe but also noted elsewhere throughout both lungs. Findings concerning for multifocal pneumonia.  Heart is normal size.  No effusions.  No acute bony abnormality.  IMPRESSION: Patchy bilateral airspace opacities concerning for multifocal pneumonia.   Original Report Authenticated By: Charlett Nose, M.D.         Subjective: Patient states that she is breathing 25-50% better. Denies any fevers, chills, chest discomfort, nausea, vomiting, diarrhea, abdominal pain. Denies any hemoptysis, dysuria, hematuria.  Objective: Filed Vitals:   11/02/12 2003 11/02/12 2101 11/02/12 2250 11/03/12 0521  BP:  137/55 144/66 125/78  Pulse:  91 98 73  Temp:  98.5 F (36.9 C) 98.3 F (36.8 C) 98.3 F (36.8 C)  TempSrc:  Oral Oral Oral  Resp:  20 18 18   Height:   5\' 4"  (1.626 m)   Weight:   104.872 kg (231 lb 3.2 oz)   SpO2: 91% 90%  91%    Intake/Output Summary (Last 24 hours) at 11/03/12 1453 Last data filed at 11/03/12 0700  Gross per 24 hour  Intake    120 ml  Output    250 ml  Net   -130 ml   Weight change:  Exam:   General:  Pt is alert, follows commands appropriately, not in acute distress  HEENT: No icterus, No thrush,  [place]/AT  Cardiovascular: RRR, S1/S2, no rubs, no gallops  Respiratory: Scattered bilateral crackles  Abdomen: Soft/+BS, non tender, non distended, no guarding  Extremities: trace edema, No lymphangitis, No petechiae, No rashes, no synovitis  Data Reviewed: Basic Metabolic  Panel:  Recent Labs Lab 11/02/12 1921 11/03/12 0255  NA 133* 131*  K 4.3 3.7  CL 97 96  CO2 19 23  GLUCOSE 210* 433*  BUN 8 8   CREATININE 0.60 0.56  CALCIUM 9.2 8.7   Liver Function Tests:  Recent Labs Lab 11/02/12 1921  AST 36  ALT 40*  ALKPHOS 164*  BILITOT 0.8  PROT 7.4  ALBUMIN 2.8*   No results found for this basename: LIPASE, AMYLASE,  in the last 168 hours No results found for this basename: AMMONIA,  in the last 168 hours CBC:  Recent Labs Lab 11/02/12 1921 11/03/12 0255  WBC 22.2* 23.2*  NEUTROABS 18.6*  --   HGB 13.0 12.7  HCT 39.0 37.1  MCV 92.0 89.4  PLT 241 262   Cardiac Enzymes: No results found for this basename: CKTOTAL, CKMB, CKMBINDEX, TROPONINI,  in the last 168 hours BNP: No components found with this basename: POCBNP,  CBG: No results found for this basename: GLUCAP,  in the last 168 hours  No results found for this or any previous visit (from the past 240 hour(s)).   Scheduled Meds: . amLODipine  5 mg Oral Daily  . aspirin EC  81 mg Oral Daily  . atenolol  100 mg Oral Daily  . azithromycin  500 mg Intravenous Q24H  . buPROPion  150 mg Oral Daily  . ceFEPime (MAXIPIME) IV  1 g Intravenous Q8H  . clopidogrel  75 mg Oral Q breakfast  . heparin  5,000 Units Subcutaneous Q8H  . insulin aspart  0-15 Units Subcutaneous TID WC  . insulin aspart  0-5 Units Subcutaneous QHS  . losartan  100 mg Oral Daily  . sertraline  50 mg Oral Daily  . vancomycin  1,000 mg Intravenous Q12H   Continuous Infusions:    Chrishana Spargur, DO  [facility] Pager [phone]  If 7PM-7AM, please contact night-coverage [email or web address] Password [email or web address] 11/03/2012, 2:53 PM   LOS: 1 day

## 2012-11-04 DIAGNOSIS — I2510 Atherosclerotic heart disease of native coronary artery without angina pectoris: Secondary | ICD-10-CM

## 2012-11-04 DIAGNOSIS — F17200 Nicotine dependence, unspecified, uncomplicated: Secondary | ICD-10-CM

## 2012-11-04 DIAGNOSIS — E785 Hyperlipidemia, unspecified: Secondary | ICD-10-CM

## 2012-11-04 LAB — BASIC METABOLIC PANEL
BUN: 11 mg/dL (ref 6–23)
Chloride: 100 mEq/L (ref 96–112)
GFR calc Af Amer: >90 mL/min (ref >90)
Potassium: 4.2 mEq/L (ref 3.5–5.1)

## 2012-11-04 LAB — CBC
HCT: 36.6 % (ref 36.0–46.0)
Hemoglobin: 12.6 g/dL (ref 12.0–15.0)
MCHC: 34.4 g/dL (ref 30.0–36.0)
WBC: 23.9 10*3/uL — ABNORMAL HIGH (ref 4.0–10.5)

## 2012-11-04 LAB — GLUCOSE, CAPILLARY
Glucose-Capillary: 273 mg/dL — ABNORMAL HIGH (ref 70–99)
Glucose-Capillary: 160 mg/dL — ABNORMAL HIGH (ref 70–99)
Glucose-Capillary: 154 mg/dL — ABNORMAL HIGH (ref 70–99)

## 2012-11-04 LAB — LEGIONELLA ANTIGEN, URINE

## 2012-11-04 LAB — HEMOGLOBIN A1C: Hgb A1c MFr Bld: 8.2 % — ABNORMAL HIGH (ref <5.7)

## 2012-11-04 MED ORDER — IPRATROPIUM BROMIDE 0.02 % IN SOLN
0.5000 mg | Freq: Four times a day (QID) | RESPIRATORY_TRACT | Status: DC
  Administered 2012-11-04 – 2012-11-06 (×6): 0.5 mg via RESPIRATORY_TRACT
  Filled 2012-11-04 (×7): qty 2.5

## 2012-11-04 MED ORDER — HYDROCODONE-ACETAMINOPHEN 5-325 MG PO TABS
1.0000 | ORAL_TABLET | Freq: Four times a day (QID) | ORAL | Status: DC | PRN
  Administered 2012-11-04: 1 via ORAL
  Filled 2012-11-04: qty 1

## 2012-11-04 MED ORDER — ALBUTEROL SULFATE (5 MG/ML) 0.5% IN NEBU
2.5000 mg | INHALATION_SOLUTION | Freq: Four times a day (QID) | RESPIRATORY_TRACT | Status: DC
  Administered 2012-11-04 – 2012-11-06 (×6): 2.5 mg via RESPIRATORY_TRACT
  Filled 2012-11-04 (×7): qty 0.5

## 2012-11-04 NOTE — Progress Notes (Signed)
[facility] PROGRESS NOTE  Karina Oneal QMV:[id number] DOB: 1955-12-09 DOA: 11/02/2012 PCP: Daisy Floro, MD  Brief History  57 year old female with a history of diet controlled diabetes mellitus, hypertension, hyperlipidemia, COPD with continued tobacco use presented with four-day history of worsening shortness of breath. The patient was previously treated with Omnicef for 10 days by her primary care physician. The patient also received a short course of prednisone which she finished on 10/26/2012. There was no significant improvement. The patient was started on moxifloxacin which she finished a five-day course. She was only slightly better. In emergency department, the patient was found to have chest x-ray with bilateral multifocal infiltrates, WBC 23.2 with hypoxemia and wheezing. The patient received one dose of methylprednisolone, 125 mg IV. The patient was started on vancomycin, cefepime, azithromycin. She states that she quit smoking 15 days ago. Previously smoked one to 2 packs per day x40 years. Patient denies weight gain, orthopnea, PND, worsening peripheral edema.  Assessment/Plan:  HCAP  -Continue empiric vancomycin, cefepime, azithromycin  -Urine Streptococcus pneumoniae antigen negative--neg -Follow blood cultures--neg to date -Clinically feeling better, but still having leukocytosis with essentially unchanged WBC -May need CT chest if no improvement  Diabetes mellitus type 2  -Hemoglobin A1c 7.1 in February 2014  -Repeat hemoglobin A1c--8.2 -Place patient on NovoLog sliding scale as her sugars are significantly elevated due to steroids  -Patient will likely need to be discharged on oral medications Coronary artery disease  -Status post drug-eluting stent to RCA 05/26/2011, drug-eluting stent to the proximal LAD 12/16/2011  -Continue aspirin and Plavix  Hypertension  -Continue losartan and atenolol and amlodipine  Depression  -Continue Zoloft  COPD  -Bronchodilators  every 6 hours around-the-clock Family Communication: Pt at beside  Disposition Plan: Home when medically stable  Antibiotics:  Vancomycin 11/02/2012>>>  Cefepime 11/02/2012>>>  Azithromycin 11/02/2012>>>     Procedures/Studies: Dg Chest 2 View  11/02/2012   *RADIOLOGY REPORT*  Clinical Data: Cough, congestion.  CHEST - 2 VIEW  Comparison: 10/28/2012  Findings: Patchy bilateral airspace opacities are noted and new since prior study.  These most pronounced in the right base and left upper lobe but also noted elsewhere throughout both lungs. Findings concerning for multifocal pneumonia.  Heart is normal size.  No effusions.  No acute bony abnormality.  IMPRESSION: Patchy bilateral airspace opacities concerning for multifocal pneumonia.   Original Report Authenticated By: Charlett Nose, M.D.         Subjective: Patient has some dyspnea on exertion. Denies any fevers, chills, chest discomfort, dizziness, nausea, vomiting, diarrhea, abdominal pain. Overall she is feeling better than the initial day of hospitalization.  Objective: Filed Vitals:   11/03/12 1900 11/04/12 0518 11/04/12 1025 11/04/12 1500  BP: 124/72 138/64  142/61  Pulse: 78 66  93  Temp: 97.6 F (36.4 C) 98.2 F (36.8 C)  98.8 F (37.1 C)  TempSrc:  Oral  Oral  Resp: 18 18  16   Height:      Weight:      SpO2: 96% 92% 89% 93%    Intake/Output Summary (Last 24 hours) at 11/04/12 1953 Last data filed at 11/04/12 0816  Gross per 24 hour  Intake    120 ml  Output      0 ml  Net    120 ml   Weight change:  Exam:   General:  Pt is alert, follows commands appropriately, not in acute distress  HEENT: No icterus, No thrush,  [place]/AT  Cardiovascular: RRR, S1/S2, no rubs,  no gallops  Respiratory: Bilateral scattered rales. Minimal scattered wheezes. Good air movement.  Abdomen: Soft/+BS, non tender, non distended, no guarding  Extremities: No edema, No lymphangitis, No petechiae, No rashes, no synovitis  Data  Reviewed: Basic Metabolic Panel:  Recent Labs Lab 11/02/12 1921 11/03/12 0255 11/04/12 0535  NA 133* 131* 139  K 4.3 3.7 4.2  CL 97 96 100  CO2 19 23 28   GLUCOSE 210* 433* 231*  BUN 8 8 11   CREATININE 0.60 0.56 0.57  CALCIUM 9.2 8.7 9.3   Liver Function Tests:  Recent Labs Lab 11/02/12 1921  AST 36  ALT 40*  ALKPHOS 164*  BILITOT 0.8  PROT 7.4  ALBUMIN 2.8*   No results found for this basename: LIPASE, AMYLASE,  in the last 168 hours No results found for this basename: AMMONIA,  in the last 168 hours CBC:  Recent Labs Lab 11/02/12 1921 11/03/12 0255 11/04/12 0535  WBC 22.2* 23.2* 23.9*  NEUTROABS 18.6*  --   --   HGB 13.0 12.7 12.6  HCT 39.0 37.1 36.6  MCV 92.0 89.4 88.6  PLT 241 262 288   Cardiac Enzymes: No results found for this basename: CKTOTAL, CKMB, CKMBINDEX, TROPONINI,  in the last 168 hours BNP: No components found with this basename: POCBNP,  CBG:  Recent Labs Lab 11/03/12 1618 11/03/12 2136 11/04/12 0622 11/04/12 1131 11/04/12 1642  GLUCAP 379* 164* 273* 160* 167*    Recent Results (from the past 240 hour(s))  CULTURE, BLOOD (ROUTINE X 2)     Status: None   Collection Time    11/03/12  2:55 AM      Result Value Range Status   Specimen Description BLOOD LEFT ARM   Final   Special Requests BOTTLES DRAWN AEROBIC AND ANAEROBIC 10CC EACH   Final   Culture  Setup Time 11/03/2012 10:37   Final   Culture     Final   Value:        BLOOD CULTURE RECEIVED NO GROWTH TO DATE CULTURE WILL BE HELD FOR 5 DAYS BEFORE ISSUING A FINAL NEGATIVE REPORT   Report Status PENDING   Incomplete  CULTURE, BLOOD (ROUTINE X 2)     Status: None   Collection Time    11/03/12  3:10 AM      Result Value Range Status   Specimen Description BLOOD RIGHT HAND   Final   Special Requests BOTTLES DRAWN AEROBIC AND ANAEROBIC 5CC EACH   Final   Culture  Setup Time 11/03/2012 10:37   Final   Culture     Final   Value:        BLOOD CULTURE RECEIVED NO GROWTH TO DATE  CULTURE WILL BE HELD FOR 5 DAYS BEFORE ISSUING A FINAL NEGATIVE REPORT   Report Status PENDING   Incomplete     Scheduled Meds: . ipratropium  0.5 mg Nebulization Q6H   And  . albuterol  2.5 mg Nebulization Q6H  . amLODipine  5 mg Oral Daily  . aspirin EC  81 mg Oral Daily  . atenolol  100 mg Oral Daily  . azithromycin  500 mg Intravenous Q24H  . buPROPion  150 mg Oral Daily  . ceFEPime (MAXIPIME) IV  1 g Intravenous Q8H  . clopidogrel  75 mg Oral Q breakfast  . heparin  5,000 Units Subcutaneous Q8H  . insulin aspart  0-15 Units Subcutaneous TID WC  . insulin aspart  0-5 Units Subcutaneous QHS  . losartan  100 mg Oral  Daily  . sertraline  50 mg Oral Daily  . vancomycin  1,000 mg Intravenous Q12H   Continuous Infusions:    Monee Dembeck, DO  [facility] Pager [phone]  If 7PM-7AM, please contact night-coverage [email or web address] Password [email or web address] 11/04/2012, 7:53 PM   LOS: 2 days

## 2012-11-05 LAB — CBC WITH DIFFERENTIAL/PLATELET
Basophils Absolute: 0 10*3/uL (ref 0.0–0.1)
Basophils Relative: 0 % (ref 0–1)
Eosinophils Absolute: 0.1 10*3/uL (ref 0.0–0.7)
Eosinophils Relative: 1 % (ref 0–5)
HCT: 35.8 % — ABNORMAL LOW (ref 36.0–46.0)
MCH: 30.2 pg (ref 26.0–34.0)
MCHC: 34.1 g/dL (ref 30.0–36.0)
MCV: 88.6 fL (ref 78.0–100.0)
Monocytes Absolute: 1.5 10*3/uL — ABNORMAL HIGH (ref 0.1–1.0)
Neutro Abs: 10.7 10*3/uL — ABNORMAL HIGH (ref 1.7–7.7)
RDW: 13.1 % (ref 11.5–15.5)

## 2012-11-05 LAB — EXPECTORATED SPUTUM ASSESSMENT W GRAM STAIN, RFLX TO RESP C

## 2012-11-05 LAB — COMPREHENSIVE METABOLIC PANEL
AST: 57 U/L — ABNORMAL HIGH (ref 0–37)
Albumin: 2.6 g/dL — ABNORMAL LOW (ref 3.5–5.2)
Calcium: 9.1 mg/dL (ref 8.4–10.5)
Creatinine, Ser: 0.54 mg/dL (ref 0.50–1.10)
Total Protein: 6.7 g/dL (ref 6.0–8.3)

## 2012-11-05 LAB — GLUCOSE, CAPILLARY
Glucose-Capillary: 187 mg/dL — ABNORMAL HIGH (ref 70–99)
Glucose-Capillary: 179 mg/dL — ABNORMAL HIGH (ref 70–99)
Glucose-Capillary: 128 mg/dL — ABNORMAL HIGH (ref 70–99)

## 2012-11-05 MED ORDER — VANCOMYCIN HCL IN DEXTROSE 1-5 GM/200ML-% IV SOLN
1000.0000 mg | Freq: Three times a day (TID) | INTRAVENOUS | Status: DC
  Administered 2012-11-05 – 2012-11-06 (×3): 1000 mg via INTRAVENOUS
  Filled 2012-11-05 (×4): qty 200

## 2012-11-05 NOTE — Progress Notes (Signed)
ANTIBIOTIC CONSULT NOTE - FOLLOW UP  Pharmacy Consult for Vancomycin Indication: pneumonia  Allergies  Allergen Reactions  . Atorvastatin Other (See Comments)    Muscle weakness.  Takes crestor 3x week without problem  . Ciprofloxacin Nausea And Vomiting  . Lisinopril Cough  . Sulfa Antibiotics Itching    Patient Measurements: Height: 5\' 4"  (162.6 cm) Weight: 231 lb 3.2 oz (104.872 kg) IBW/kg (Calculated) : 54.7 Adjusted Body Weight:    Vital Signs: Temp: 98.6 F (37 C) (07/12 1359) Temp src: Oral (07/12 1359) BP: 146/68 mmHg (07/12 1359) Pulse Rate: 70 (07/12 1359) Intake/Output from previous day: 07/11 0701 - 07/12 0700 In: 1390 [P.O.:840; IV Piggyback:550] Out: -  Intake/Output from this shift: Total I/O In: 120 [P.O.:120] Out: -   Labs:  Recent Labs  11/03/12 0255 11/04/12 0535 11/05/12 0445  WBC 23.2* 23.9* 14.6*  HGB 12.7 12.6 12.2  PLT 262 288 274  CREATININE 0.56 0.57 0.54   Estimated Creatinine Clearance: 93.8 ml/min (by C-G formula based on Cr of 0.54).  Recent Labs  11/05/12 1401  VANCOTROUGH 7.3*     Microbiology: Recent Results (from the past 720 hour(s))  CULTURE, BLOOD (ROUTINE X 2)     Status: None   Collection Time    11/03/12  2:55 AM      Result Value Range Status   Specimen Description BLOOD LEFT ARM   Final   Special Requests BOTTLES DRAWN AEROBIC AND ANAEROBIC 10CC EACH   Final   Culture  Setup Time 11/03/2012 10:37   Final   Culture     Final   Value:        BLOOD CULTURE RECEIVED NO GROWTH TO DATE CULTURE WILL BE HELD FOR 5 DAYS BEFORE ISSUING A FINAL NEGATIVE REPORT   Report Status PENDING   Incomplete  CULTURE, BLOOD (ROUTINE X 2)     Status: None   Collection Time    11/03/12  3:10 AM      Result Value Range Status   Specimen Description BLOOD RIGHT HAND   Final   Special Requests BOTTLES DRAWN AEROBIC AND ANAEROBIC 5CC EACH   Final   Culture  Setup Time 11/03/2012 10:37   Final   Culture     Final   Value:         BLOOD CULTURE RECEIVED NO GROWTH TO DATE CULTURE WILL BE HELD FOR 5 DAYS BEFORE ISSUING A FINAL NEGATIVE REPORT   Report Status PENDING   Incomplete  CULTURE, EXPECTORATED SPUTUM-ASSESSMENT     Status: None   Collection Time    11/05/12 11:30 AM      Result Value Range Status   Specimen Description SPUTUM   Final   Special Requests NONE   Final   Sputum evaluation     Final   Value: THIS SPECIMEN IS ACCEPTABLE. RESPIRATORY CULTURE REPORT TO FOLLOW.   Report Status 11/05/2012 FINAL   Final    Anti-infectives   Start     Dose/Rate Route Frequency Ordered Stop   11/03/12 0600  ceFEPIme (MAXIPIME) 1 g in dextrose 5 % 50 mL IVPB     1 g 100 mL/hr over 30 Minutes Intravenous 3 times per day 11/03/12 0204 11/11/12 0559   11/03/12 0400  vancomycin (VANCOCIN) IVPB 1000 mg/200 mL premix     1,000 mg 200 mL/hr over 60 Minutes Intravenous Every 12 hours 11/03/12 0226     11/03/12 0300  azithromycin (ZITHROMAX) 500 mg in dextrose 5 % 250 mL IVPB  500 mg 250 mL/hr over 60 Minutes Intravenous Every 24 hours 11/03/12 0204     11/02/12 2000  vancomycin (VANCOCIN) IVPB 1000 mg/200 mL premix     1,000 mg 200 mL/hr over 60 Minutes Intravenous  Once 11/02/12 1945 11/02/12 2058   11/02/12 2000  piperacillin-tazobactam (ZOSYN) IVPB 3.375 g     3.375 g 100 mL/hr over 30 Minutes Intravenous  Once 11/02/12 1945 11/02/12 2135      Assessment: 57yo female had been Dx'd w/ PNA 2wks ago by PCP, was on Omnicef x10d without improvement, then switched to Avelox x5d, now w/ sx somewhat improved though persistent, CXR concerning for multi-focal PNA.  Anticoagulation: SQ heparin (pt refusing)  Infectious Disease: PNA, Tmax 99.9, WBC 23.9 down to 14.6 today.Vanco trough only 7.3, less than goal. 7/9 Vanc>> 7/9 Zosyn x 1 7/10 Cefepime>> 7/10 Azith>>  Goal of Therapy:  Vancomycin trough level 15-20 mcg/ml  Plan:   - vancomycin increase to 1g IV q8hrs. -cefepime 1g q8 per MD -azith 500mg  q24 per  MD   Pasty Spillers 11/05/2012,3:45 PM

## 2012-11-05 NOTE — Progress Notes (Signed)
Pt ambulated in hallway without any shortness of breath.  Pt oxygen saturation dropped to 84% on room air.  Pt felt fine and returned to room.  Pt says she wears her oxygen when short of breath. Pt resting with call bell within reach. Will continue to monitor. Thomas Hoff

## 2012-11-05 NOTE — Progress Notes (Signed)
[facility] PROGRESS NOTE  Karina Oneal ZOX:[id number] DOB: 27-Jun-1955 DOA: 11/02/2012 PCP: Karina Floro, MD  Assessment/Plan: HCAP  -Continue empiric vancomycin, cefepime, azithromycin  -Urine Streptococcus pneumoniae antigen negative--neg  -Follow blood cultures--neg to date  -Clinically feeling better -WBC improving -May need CT chest if no improvement  Diabetes mellitus type 2  -Hemoglobin A1c 7.1 in February 2014  -Repeat hemoglobin A1c--8.2  -Place patient on NovoLog sliding scale as her sugars are significantly elevated due to steroids  -Patient will likely need to be discharged on oral medications--she is amenable to starting metformin at the time of discharge Coronary artery disease  -Status post drug-eluting stent to RCA 05/26/2011, drug-eluting stent to the proximal LAD 12/16/2011  -Continue aspirin and Plavix  Hypertension  -Continue losartan and atenolol and amlodipine  Depression  -Continue Zoloft  COPD  -Bronchodilators every 6 hours around-the-clock  Family Communication: Pt at beside  Disposition Plan: Home when medically stable  Antibiotics:  Vancomycin 11/02/2012>>>  Cefepime 11/02/2012>>>  Azithromycin 11/02/2012>>>      Procedures/Studies: Dg Chest 2 View  11/02/2012   *RADIOLOGY REPORT*  Clinical Data: Cough, congestion.  CHEST - 2 VIEW  Comparison: 10/28/2012  Findings: Patchy bilateral airspace opacities are noted and new since prior study.  These most pronounced in the right base and left upper lobe but also noted elsewhere throughout both lungs. Findings concerning for multifocal pneumonia.  Heart is normal size.  No effusions.  No acute bony abnormality.  IMPRESSION: Patchy bilateral airspace opacities concerning for multifocal pneumonia.   Original Report Authenticated By: Karina Oneal, M.D.         Subjective: Patient states that she is breathing better. Denies any fevers, chills, chest discomfort, nausea, vomiting, diarrhea,  abdominal pain, dysuria, hematuria.  Objective: Filed Vitals:   11/04/12 2057 11/05/12 0044 11/05/12 0436 11/05/12 0854  BP:   143/69   Pulse: 62  87   Temp:  98.7 F (37.1 C) 98.8 F (37.1 C)   TempSrc:  Oral Oral   Resp: 20  18   Height:      Weight:      SpO2: 96%  90% 90%    Intake/Output Summary (Last 24 hours) at 11/05/12 1023 Last data filed at 11/05/12 0700  Gross per 24 hour  Intake   1270 ml  Output      0 ml  Net   1270 ml   Weight change:  Exam:   General:  Pt is alert, follows commands appropriately, not in acute distress  HEENT: No icterus, No thrush, [place]/AT  Cardiovascular: RRR, S1/S2, no rubs, no gallops  Respiratory: Minimal scattered wheezes. Good air movement. No rhonchi.  Abdomen: Soft/+BS, non tender, non distended, no guarding  Extremities: trace edema, No lymphangitis, No petechiae, No rashes, no synovitis  Data Reviewed: Basic Metabolic Panel:  Recent Labs Lab 11/02/12 1921 11/03/12 0255 11/04/12 0535 11/05/12 0445  NA 133* 131* 139 137  K 4.3 3.7 4.2 3.5  CL 97 96 100 100  CO2 19 23 28 27   GLUCOSE 210* 433* 231* 187*  BUN 8 8 11 8   CREATININE 0.60 0.56 0.57 0.54  CALCIUM 9.2 8.7 9.3 9.1   Liver Function Tests:  Recent Labs Lab 11/02/12 1921 11/05/12 0445  AST 36 57*  ALT 40* 93*  ALKPHOS 164* 157*  BILITOT 0.8 0.5  PROT 7.4 6.7  ALBUMIN 2.8* 2.6*   No results found for this basename: LIPASE, AMYLASE,  in the last 168 hours No results found  for this basename: AMMONIA,  in the last 168 hours CBC:  Recent Labs Lab 11/02/12 1921 11/03/12 0255 11/04/12 0535 11/05/12 0445  WBC 22.2* 23.2* 23.9* 14.6*  NEUTROABS 18.6*  --   --  10.7*  HGB 13.0 12.7 12.6 12.2  HCT 39.0 37.1 36.6 35.8*  MCV 92.0 89.4 88.6 88.6  PLT 241 262 288 274   Cardiac Enzymes: No results found for this basename: CKTOTAL, CKMB, CKMBINDEX, TROPONINI,  in the last 168 hours BNP: No components found with this basename: POCBNP,  CBG:  Recent  Labs Lab 11/04/12 0622 11/04/12 1131 11/04/12 1642 11/04/12 2104 11/05/12 0616  GLUCAP 273* 160* 167* 154* 182*    Recent Results (from the past 240 hour(s))  CULTURE, BLOOD (ROUTINE X 2)     Status: None   Collection Time    11/03/12  2:55 AM      Result Value Range Status   Specimen Description BLOOD LEFT ARM   Final   Special Requests BOTTLES DRAWN AEROBIC AND ANAEROBIC 10CC EACH   Final   Culture  Setup Time 11/03/2012 10:37   Final   Culture     Final   Value:        BLOOD CULTURE RECEIVED NO GROWTH TO DATE CULTURE WILL BE HELD FOR 5 DAYS BEFORE ISSUING A FINAL NEGATIVE REPORT   Report Status PENDING   Incomplete  CULTURE, BLOOD (ROUTINE X 2)     Status: None   Collection Time    11/03/12  3:10 AM      Result Value Range Status   Specimen Description BLOOD RIGHT HAND   Final   Special Requests BOTTLES DRAWN AEROBIC AND ANAEROBIC 5CC EACH   Final   Culture  Setup Time 11/03/2012 10:37   Final   Culture     Final   Value:        BLOOD CULTURE RECEIVED NO GROWTH TO DATE CULTURE WILL BE HELD FOR 5 DAYS BEFORE ISSUING A FINAL NEGATIVE REPORT   Report Status PENDING   Incomplete     Scheduled Meds: . ipratropium  0.5 mg Nebulization Q6H   And  . albuterol  2.5 mg Nebulization Q6H  . amLODipine  5 mg Oral Daily  . aspirin EC  81 mg Oral Daily  . atenolol  100 mg Oral Daily  . azithromycin  500 mg Intravenous Q24H  . buPROPion  150 mg Oral Daily  . ceFEPime (MAXIPIME) IV  1 g Intravenous Q8H  . clopidogrel  75 mg Oral Q breakfast  . heparin  5,000 Units Subcutaneous Q8H  . insulin aspart  0-15 Units Subcutaneous TID WC  . insulin aspart  0-5 Units Subcutaneous QHS  . losartan  100 mg Oral Daily  . sertraline  50 mg Oral Daily  . vancomycin  1,000 mg Intravenous Q12H   Continuous Infusions:    Karina Dansby, DO  [facility] Pager [phone]  If 7PM-7AM, please contact night-coverage [email or web address] Password [email or web address] 11/05/2012, 10:23 AM   LOS: 3 days

## 2012-11-05 NOTE — Discharge Summary (Signed)
Physician Discharge Summary  Karina Oneal ZOX:[id number] DOB: Jul 08, 1955 DOA: 11/02/2012  PCP: Karina Floro, MD  Admit date: 11/02/2012 Discharge date: 11/06/2012 Recommendations for Outpatient Follow-up:  1. Pt will need to follow up with PCP in 1 weeks post discharge 2. Please obtain BMP to evaluate electrolytes and kidney function 3. Please also check CBC to evaluate Hg and Hct levels 4. Please check ambulatory pulse oximetry to assess patient's continued need for supplemental oxygen  Discharge Diagnoses:  Principal Problem:   HCAP (healthcare-associated pneumonia) Active Problems:   Leukocytosis, unspecified HCAP  -Continue empiric vancomycin, cefepime, azithromycin--the patient had 5 days of intravenous antibiotics in hospital -The patient was discharged home with levofloxacin 750 mg daily x 5 additional days -Urine Streptococcus pneumoniae antigen negative--neg  -Follow blood cultures--neg to date  -Clinically feeling better  -WBC improving from 23.9 to 14.7  on the day of discharge -Ambulatory pulse oximetry showed the patient had oxygen desaturation to 84% on room air. The patient was set up with home oxygen--2 liters Diabetes mellitus type 2  -Hemoglobin A1c 7.1 in February 2014  -Repeat hemoglobin A1c--8.2 (11/04/12) -Placed patient on NovoLog sliding scale as her sugars are significantly elevated due to steroids  -Patient will likely need to be discharged on oral medications--she is amenable to starting metformin at the time of discharge--risks, benefits, and alternatives were discussed with the patient, and the patient understood and agreed to follow treatment protocol -She'll be discharged on metformin 500 mg twice a day -The patient was instructed to followup with primary care provider for further titration of her diabetic regimen Coronary artery disease  -Status post drug-eluting stent to RCA 05/26/2011, drug-eluting stent to the proximal LAD 12/16/2011  -Continue  aspirin and Plavix  Hypertension  -Continue losartan and atenolol and amlodipine  -Remained stable Depression  -Continue Zoloft  COPD  -Bronchodilators every 6 hours around-the-clock  - discharged home with Spiriva--patient stated she had rescue albuterol inhaler at home -Patient received one dose of Solu-Medrol 125 mg IV x1 on the day of admission in ED -Tobacco cessation was discussed -The patient continued to have a mild amount of wheezing on examination--she was given prednisone 50 mg x1 on the day of discharge -The patient will be discharged home with prednisone 50 mg daily x4 additional days Family Communication: Pt at beside  Disposition Plan: Home when medically stable  Antibiotics:  Vancomycin 11/02/2012>>> 11/06/2012 Cefepime 11/02/2012>>> 11/06/2012 Azithromycin 11/02/2012>>> 11/06/2012   Discharge Condition: Stable  Disposition:  home  Diet: 1800-2000-calorie ADA diet Wt Readings from Last 3 Encounters:  11/02/12 104.872 kg (231 lb 3.2 oz)  05/23/12 107.23 kg (236 lb 6.4 oz)  04/08/12 110.224 kg (243 lb)    History of present illness:   57 year old female with a history of diet controlled diabetes mellitus, hypertension, hyperlipidemia, COPD with continued tobacco use presented with four-day history of worsening shortness of breath. The patient was previously treated with Omnicef for 10 days by her primary care physician. The patient also received a short course of prednisone which she finished on 10/26/2012. There was no significant improvement. The patient was started on moxifloxacin which she finished a five-day course. She was only slightly better. In emergency department, the patient was found to have chest x-ray with bilateral multifocal infiltrates, WBC 23.2 with hypoxemia and wheezing. The patient received one dose of methylprednisolone, 125 mg IV. The patient was started on vancomycin, cefepime, azithromycin. She states that she quit smoking 15 days ago.  Previously smoked one to 2 packs  per day x40 years. Patient denies weight gain, orthopnea, PND, worsening peripheral edema. The patient's initial WBC count did not significantly improved. This was thought to be due to the patient's initial dose of IV Solu-Medrol in the emergency department, 125 mg. Clinically, the patient gradually improved. She was continued on intravenous vancomycin, cefepime, and azithromycin. Her WBC count eventually decreased. The patient continued to improve but continued to have some dyspnea on exertion. This was thought to be due to in part from deconditioning. Ambulatory pulse oximetry showed that the patient had oxygen saturation to 84% on room air. The patient was set up with home oxygen--2liters     Discharge Exam: Filed Vitals:   11/06/12 0700  BP: 159/83  Pulse: 68  Temp: 98.7 F (37.1 C)  Resp: 18   Filed Vitals:   11/05/12 2050 11/06/12 0139 11/06/12 0700 11/06/12 0853  BP:   159/83   Pulse:   68   Temp:   98.7 F (37.1 C)   TempSrc:   Oral   Resp:   18   Height:      Weight:      SpO2: 90% 87% 90% 96%   General: A&O x 3, NAD, pleasant, cooperative Cardiovascular: RRR, no rub, no gallop, no S3 Respiratory: Bilateral scattered rales. Mild scattered wheezes bilateral. Good air movement. Abdomen:soft, nontender, nondistended, positive bowel sounds Extremities: trace edema, No lymphangitis, no petechiae  Discharge Instructions     Medication List    STOP taking these medications       moxifloxacin 400 MG tablet  Commonly known as:  AVELOX      TAKE these medications       albuterol 108 (90 BASE) MCG/ACT inhaler  Commonly known as:  PROVENTIL HFA;VENTOLIN HFA  Inhale 2 puffs into the lungs every 6 (six) hours as needed for wheezing.     amLODipine 5 MG tablet  Commonly known as:  NORVASC  Take 5 mg by mouth daily.     aspirin 81 MG EC tablet  Take 81 mg by mouth daily.     atenolol 100 MG tablet  Commonly known as:  TENORMIN  Take  100 mg by mouth daily.     clopidogrel 75 MG tablet  Commonly known as:  PLAVIX  Take 1 tablet (75 mg total) by mouth daily with breakfast.     CO Q 10 PO  Take 1 tablet by mouth daily.     guaiFENesin 600 MG 12 hr tablet  Commonly known as:  MUCINEX  Take 1,200 mg by mouth 2 (two) times daily.     hydrochlorothiazide 25 MG tablet  Commonly known as:  HYDRODIURIL  Take 25 mg by mouth daily.     levofloxacin 750 MG tablet  Commonly known as:  LEVAQUIN  Take 1 tablet (750 mg total) by mouth daily.     losartan 100 MG tablet  Commonly known as:  COZAAR  Take 100 mg by mouth daily.     metFORMIN 500 MG tablet  Commonly known as:  GLUCOPHAGE  Take 1 tablet (500 mg total) by mouth 2 (two) times daily with a meal.     nitroGLYCERIN 0.4 MG SL tablet  Commonly known as:  NITROSTAT  Place 0.4 mg under the tongue every 5 (five) minutes as needed. For chest pain     predniSONE 50 MG tablet  Commonly known as:  DELTASONE  Take one tablet (50mg ) daily until gone.  Start 11/07/12     rosuvastatin 5  MG tablet  Commonly known as:  CRESTOR  Take 5 mg by mouth as directed.     sertraline 50 MG tablet  Commonly known as:  ZOLOFT  Take 50 mg by mouth daily.     tiotropium 18 MCG inhalation capsule  Commonly known as:  SPIRIVA HANDIHALER  Place 1 capsule (18 mcg total) into inhaler and inhale daily.     WELLBUTRIN SR 150 MG 12 hr tablet  Generic drug:  buPROPion  Take 150 mg by mouth daily.         The results of significant diagnostics from this hospitalization (including imaging, microbiology, ancillary and laboratory) are listed below for reference.    Significant Diagnostic Studies: Dg Chest 2 View  11/02/2012   *RADIOLOGY REPORT*  Clinical Data: Cough, congestion.  CHEST - 2 VIEW  Comparison: 10/28/2012  Findings: Patchy bilateral airspace opacities are noted and new since prior study.  These most pronounced in the right base and left upper lobe but also noted elsewhere  throughout both lungs. Findings concerning for multifocal pneumonia.  Heart is normal size.  No effusions.  No acute bony abnormality.  IMPRESSION: Patchy bilateral airspace opacities concerning for multifocal pneumonia.   Original Report Authenticated By: Charlett Nose, M.D.     Microbiology: Recent Results (from the past 240 hour(s))  CULTURE, BLOOD (ROUTINE X 2)     Status: None   Collection Time    11/03/12  2:55 AM      Result Value Range Status   Specimen Description BLOOD LEFT ARM   Final   Special Requests BOTTLES DRAWN AEROBIC AND ANAEROBIC 10CC EACH   Final   Culture  Setup Time 11/03/2012 10:37   Final   Culture     Final   Value:        BLOOD CULTURE RECEIVED NO GROWTH TO DATE CULTURE WILL BE HELD FOR 5 DAYS BEFORE ISSUING A FINAL NEGATIVE REPORT   Report Status PENDING   Incomplete  CULTURE, BLOOD (ROUTINE X 2)     Status: None   Collection Time    11/03/12  3:10 AM      Result Value Range Status   Specimen Description BLOOD RIGHT HAND   Final   Special Requests BOTTLES DRAWN AEROBIC AND ANAEROBIC 5CC EACH   Final   Culture  Setup Time 11/03/2012 10:37   Final   Culture     Final   Value:        BLOOD CULTURE RECEIVED NO GROWTH TO DATE CULTURE WILL BE HELD FOR 5 DAYS BEFORE ISSUING A FINAL NEGATIVE REPORT   Report Status PENDING   Incomplete  CULTURE, EXPECTORATED SPUTUM-ASSESSMENT     Status: None   Collection Time    11/05/12 11:30 AM      Result Value Range Status   Specimen Description SPUTUM   Final   Special Requests NONE   Final   Sputum evaluation     Final   Value: THIS SPECIMEN IS ACCEPTABLE. RESPIRATORY CULTURE REPORT TO FOLLOW.   Report Status 11/05/2012 FINAL   Final     Labs: Basic Metabolic Panel:  Recent Labs Lab 11/02/12 1921 11/03/12 0255 11/04/12 0535 11/05/12 0445  NA 133* 131* 139 137  K 4.3 3.7 4.2 3.5  CL 97 96 100 100  CO2 19 23 28 27   GLUCOSE 210* 433* 231* 187*  BUN 8 8 11 8   CREATININE 0.60 0.56 0.57 0.54  CALCIUM 9.2 8.7 9.3  9.1   Liver Function  Tests:  Recent Labs Lab 11/02/12 1921 11/05/12 0445  AST 36 57*  ALT 40* 93*  ALKPHOS 164* 157*  BILITOT 0.8 0.5  PROT 7.4 6.7  ALBUMIN 2.8* 2.6*   No results found for this basename: LIPASE, AMYLASE,  in the last 168 hours No results found for this basename: AMMONIA,  in the last 168 hours CBC:  Recent Labs Lab 11/02/12 1921 11/03/12 0255 11/04/12 0535 11/05/12 0445 11/06/12 0500  WBC 22.2* 23.2* 23.9* 14.6* 14.7*  NEUTROABS 18.6*  --   --  10.7*  --   HGB 13.0 12.7 12.6 12.2 11.3*  HCT 39.0 37.1 36.6 35.8* 32.8*  MCV 92.0 89.4 88.6 88.6 87.0  PLT 241 262 288 274 254   Cardiac Enzymes: No results found for this basename: CKTOTAL, CKMB, CKMBINDEX, TROPONINI,  in the last 168 hours BNP: No components found with this basename: POCBNP,  CBG:  Recent Labs Lab 11/05/12 0616 11/05/12 1114 11/05/12 1615 11/05/12 2119 11/06/12 0626  GLUCAP 182* 187* 179* 128* 170*    Time coordinating discharge:  Greater than 30 minutes  Signed:  Ruba Outen, DO [facility] Pager: [phone] 11/06/2012, 9:54 AM

## 2012-11-06 DIAGNOSIS — I10 Essential (primary) hypertension: Secondary | ICD-10-CM

## 2012-11-06 LAB — CBC
Hemoglobin: 11.3 g/dL — ABNORMAL LOW (ref 12.0–15.0)
MCHC: 34.5 g/dL (ref 30.0–36.0)
Platelets: 254 10*3/uL (ref 150–400)
RBC: 3.77 MIL/uL — ABNORMAL LOW (ref 3.87–5.11)

## 2012-11-06 LAB — GLUCOSE, CAPILLARY: Glucose-Capillary: 170 mg/dL — ABNORMAL HIGH (ref 70–99)

## 2012-11-06 MED ORDER — CLOPIDOGREL BISULFATE 75 MG PO TABS: 75.0000 mg | ORAL_TABLET | Freq: Every day | ORAL | Status: DC

## 2012-11-06 MED ORDER — METFORMIN HCL 500 MG PO TABS: 500.0000 mg | ORAL_TABLET | Freq: Two times a day (BID) | ORAL | Status: DC

## 2012-11-06 MED ORDER — TIOTROPIUM BROMIDE MONOHYDRATE 18 MCG IN CAPS: 18.0000 ug | ORAL_CAPSULE | Freq: Every day | RESPIRATORY_TRACT | Status: DC

## 2012-11-06 MED ORDER — LEVOFLOXACIN 750 MG PO TABS: 750.0000 mg | ORAL_TABLET | Freq: Every day | ORAL | Status: DC

## 2012-11-06 MED ORDER — PREDNISONE 50 MG PO TABS
50.0000 mg | ORAL_TABLET | Freq: Every day | ORAL | Status: DC
  Administered 2012-11-06: 50 mg via ORAL
  Filled 2012-11-06 (×2): qty 1

## 2012-11-06 MED ORDER — PREDNISONE 50 MG PO TABS: ORAL_TABLET | ORAL | Status: DC

## 2012-11-06 NOTE — Progress Notes (Signed)
NCM spoke to pt and made her aware to contact [facility] when she arrive home to deliver oxygen. Isidoro Donning RN CCM Case Mgmt phone [phone]

## 2012-11-07 LAB — CULTURE, RESPIRATORY W GRAM STAIN

## 2012-11-09 LAB — CULTURE, BLOOD (ROUTINE X 2): Culture: NO GROWTH

## 2012-11-11 ENCOUNTER — Ambulatory Visit (INDEPENDENT_AMBULATORY_CARE_PROVIDER_SITE_OTHER): Payer: 59 | Admitting: Internal Medicine

## 2012-11-11 ENCOUNTER — Telehealth: Payer: Self-pay | Admitting: Internal Medicine

## 2012-11-11 ENCOUNTER — Encounter: Payer: Self-pay | Admitting: Internal Medicine

## 2012-11-11 ENCOUNTER — Ambulatory Visit (INDEPENDENT_AMBULATORY_CARE_PROVIDER_SITE_OTHER)
Admission: RE | Admit: 2012-11-11 | Discharge: 2012-11-11 | Disposition: A | Discharge status: Home / Self Care | Payer: 59 | Source: Ambulatory Visit | Attending: Internal Medicine | Admitting: Internal Medicine

## 2012-11-11 VITALS — BP 118/70 | HR 60 | Temp 97.8°F | Ht 65.0 in | Wt 231.0 lb

## 2012-11-11 DIAGNOSIS — R059 Cough, unspecified: Secondary | ICD-10-CM | POA: Insufficient documentation

## 2012-11-11 DIAGNOSIS — J189 Pneumonia, unspecified organism: Secondary | ICD-10-CM

## 2012-11-11 DIAGNOSIS — R05 Cough: Secondary | ICD-10-CM | POA: Insufficient documentation

## 2012-11-11 DIAGNOSIS — J441 Chronic obstructive pulmonary disease with (acute) exacerbation: Secondary | ICD-10-CM

## 2012-11-11 MED ORDER — BUDESONIDE-FORMOTEROL FUMARATE 160-4.5 MCG/ACT IN AERO: INHALATION_SPRAY | RESPIRATORY_TRACT | Status: DC

## 2012-11-11 NOTE — Assessment & Plan Note (Addendum)
DDX of  difficult airways managment all start with A and  include Adherence, Ace Inhibitors, Acid Reflux, Active Sinus Disease, Alpha 1 Antitripsin deficiency, Anxiety masquerading as Airways dz,  ABPA,  allergy(esp in young), Aspiration (esp in elderly), Adverse effects of DPI,  Active smokers, plus two Bs  = Bronchiectasis and Beta blocker use..and one C= CHF  Active smoking was the biggest concern and hopefully has been eliminated at this point> critical that she remain off  The proper method of use, as well as anticipated side effects, of a metered-dose inhaler are discussed and demonstrated to the patient. Improved effectiveness after extensive coaching during this visit to a level of approximately  75% so try adding symbiocrt 160 2bid and then return for pfts  ACEi > h/o intolerance strongly suggest she may have a component of uacs masquerading as asthma but for now no change in rx pending pfts' to confirm what's what here

## 2012-11-11 NOTE — Assessment & Plan Note (Signed)
Resolved, no further rx

## 2012-11-11 NOTE — Progress Notes (Signed)
Subjective:     Patient ID: Karina Oneal, female   DOB: 21-Jun-1955   MRN: [id number]  HPI   57 yowf active smoker with h/o acei cough referred to pulmonary 11/11/2012 post Hosp:   Admit date: 11/02/2012  Discharge date: 11/06/2012  Recommendations for Outpatient Follow-up:  1. Pt will need to follow up with PCP in 1 weeks post discharge 2. Please obtain BMP to evaluate electrolytes and kidney function 3. Please also check CBC to evaluate Hg and Hct levels 4. Please check ambulatory pulse oximetry to assess patient's continued need for supplemental oxygen Discharge Diagnoses:  Principal Problem:  HCAP (healthcare-associated pneumonia)  Active Problems:  Leukocytosis, unspecified  HCAP  -Continue empiric vancomycin, cefepime, azithromycin--the patient had 5 days of intravenous antibiotics in hospital  -The patient was discharged home with levofloxacin 750 mg daily x 5 additional days  -Urine Streptococcus pneumoniae antigen negative--neg  -Follow blood cultures--neg to date  -Clinically feeling better  -WBC improving from 23.9 to 14.7 on the day of discharge  -Ambulatory pulse oximetry showed the patient had oxygen desaturation to 84% on room air. The patient was set up with home oxygen--2 liters  Diabetes mellitus type 2  -Hemoglobin A1c 7.1 in February 2014  -Repeat hemoglobin A1c--8.2 (11/04/12)  -Placed patient on NovoLog sliding scale as her sugars are significantly elevated due to steroids  -Patient will likely need to be discharged on oral medications--she is amenable to starting metformin at the time of discharge--risks, benefits, and alternatives were discussed with the patient, and the patient understood and agreed to follow treatment protocol  -She'll be discharged on metformin 500 mg twice a day  -The patient was instructed to followup with primary care provider for further titration of her diabetic regimen  Coronary artery disease  -Status post drug-eluting stent to RCA  05/26/2011, drug-eluting stent to the proximal LAD 12/16/2011  -Continue aspirin and Plavix  Hypertension  -Continue losartan and atenolol and amlodipine  -Remained stable  Depression  -Continue Zoloft  COPD  -Bronchodilators every 6 hours around-the-clock  - discharged home with Spiriva--patient stated she had rescue albuterol inhaler at home  -Patient received one dose of Solu-Medrol 125 mg IV x1 on the day of admission in ED  -Tobacco cessation was discussed  -The patient continued to have a mild amount of wheezing on examination--she was given prednisone 50 mg x1 on the day of discharge  -The patient will be discharged home with prednisone 50 mg daily x4 additional days  Family Communication: Pt at beside  Disposition Plan: Home when medically stable  Antibiotics:  Vancomycin 11/02/2012>>> 11/06/2012  Cefepime 11/02/2012>>> 11/06/2012  Azithromycin 11/02/2012>>> 11/06/2012 Discharge Condition: Stable  Disposition: home  Diet: 1800-2000-calorie ADA diet  Wt Readings from Last 3 Encounters:   11/02/12  104.872 kg (231 lb 3.2 oz)   05/23/12  107.23 kg (236 lb 6.4 oz)   04/08/12  110.224 kg (243 lb)   History of present illness:  57 year old female with a history of diet controlled diabetes mellitus, hypertension, hyperlipidemia, COPD with continued tobacco use presented with four-day history of worsening shortness of breath. The patient was previously treated with Omnicef for 10 days by her primary care physician. The patient also received a short course of prednisone which she finished on 10/26/2012. There was no significant improvement. The patient was started on moxifloxacin which she finished a five-day course. She was only slightly better. In emergency department, the patient was found to have chest x-ray with bilateral multifocal infiltrates, WBC  23.2 with hypoxemia and wheezing. The patient received one dose of methylprednisolone, 125 mg IV. The patient was started on vancomycin,  cefepime, azithromycin. She states that she quit smoking 15 days ago. Previously smoked one to 2 packs per day x40 years. Patient denies weight gain, orthopnea, PND, worsening peripheral edema. The patient's initial WBC count did not significantly improved. This was thought to be due to the patient's initial dose of IV Solu-Medrol in the emergency department, 125 mg. Clinically, the patient gradually improved. She was continued on intravenous vancomycin, cefepime, and azithromycin. Her WBC count eventually decreased. The patient continued to improve but continued to have some dyspnea on exertion. This was thought to be due to in part from deconditioning. Ambulatory pulse oximetry showed that the patient had oxygen saturation to 84% on room air. 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  Finished pred 1 day prior to OV  Finishing levaquin on day of ov > min yellow still all day  New spriva maint/ alb down 2 x daily .  hoarseness is nl for years.  sats as low as 84 walking, stopped using 02   No obvious daytime variabilty or cp or chest tightness, subjective wheeze overt sinus or hb symptoms. No unusual exp hx or h/o childhood pna/ asthma or knowledge of premature birth.   Sleeping ok without nocturnal  or early am exacerbation  of respiratory  c/o's or need for noct saba. Also denies any obvious fluctuation of symptoms with weather or environmental changes or other aggravating or alleviating factors except as outlined above   Current Medications, Allergies, Past Medical History, Past Surgical History, Family History, and Social History were reviewed in [employer] record.  ROS  The following are not active complaints unless bolded sore throat, dysphagia, dental problems, itching, sneezing,  nasal congestion or  excess/ purulent secretions, ear ache,   fever, chills, sweats, unintended wt loss, pleuritic or exertional cp, hemoptysis,  orthopnea pnd or leg swelling, presyncope, palpitations, heartburn, abdominal pain, anorexia, nausea, vomiting, diarrhea  or change in bowel or urinary habits, change in stools or urine, dysuria,hematuria,  rash, arthralgias, visual complaints, headache, numbness weakness or ataxia or problems with walking or coordination,  change in mood/affect or memory.       Review of Systems     Objective:   Physical Exam Hoarse amb obese wf nad  Wt Readings from Last 3 Encounters:  11/11/12 231 lb (104.781 kg)  11/02/12 231 lb 3.2 oz (104.872 kg)  05/23/12 236 lb 6.4 oz (107.23 kg)    HEENT mild turbinate edema.  Oropharynx no thrush or excess pnd or cobblestoning.  No JVD or cervical adenopathy. Mild accessory muscle hypertrophy. Trachea midline, nl thryroid. Chest was hyperinflated by percussion with diminished breath sounds and moderate increased exp time without wheeze. Hoover sign positive at mid inspiration. Regular rate and rhythm without murmur gallop or rub or increase P2 or edema.  Abd: no hsm, nl excursion. Ext warm without cyanosis or clubbing.     CXR  11/11/2012 :  1. Significant positive response to therapy with near complete resolution of multilobar pneumonia, as above. 2. Atherosclerosis.      Assessment:

## 2012-11-11 NOTE — Telephone Encounter (Signed)
Results have been explained to patient, pt expressed understanding. Nothing further needed.  

## 2012-11-11 NOTE — Patient Instructions (Addendum)
Ok to self titrate 02 for sat > 90%  symbicort 160 Take 2 puffs first thing in am and then another 2 puffs about 12 hours later.  Continue spiriva for now each am    Only use your albuterol as a rescue medication to be used if you can't catch your breath by resting or doing a relaxed purse lip breathing pattern. The less you use it, the better it will work when you need it.    Please remember to go to the  x-ray department downstairs for your tests - we will call you with the results when they are available.  Return to work Monday 11/14/12      Please schedule a follow up office visit in 4 weeks, sooner if needed pfts

## 2012-12-13 ENCOUNTER — Telehealth: Payer: Self-pay | Admitting: Internal Medicine

## 2012-12-13 ENCOUNTER — Ambulatory Visit (INDEPENDENT_AMBULATORY_CARE_PROVIDER_SITE_OTHER)
Admission: RE | Admit: 2012-12-13 | Discharge: 2012-12-13 | Disposition: A | Discharge status: Home / Self Care | Payer: 59 | Source: Ambulatory Visit | Attending: Internal Medicine | Admitting: Internal Medicine

## 2012-12-13 ENCOUNTER — Ambulatory Visit (INDEPENDENT_AMBULATORY_CARE_PROVIDER_SITE_OTHER): Payer: 59 | Admitting: Internal Medicine

## 2012-12-13 ENCOUNTER — Encounter: Payer: Self-pay | Admitting: Internal Medicine

## 2012-12-13 VITALS — BP 126/78 | HR 55 | Temp 98.6°F | Ht 64.0 in | Wt 229.0 lb

## 2012-12-13 DIAGNOSIS — R05 Cough: Secondary | ICD-10-CM

## 2012-12-13 DIAGNOSIS — J189 Pneumonia, unspecified organism: Secondary | ICD-10-CM

## 2012-12-13 DIAGNOSIS — R059 Cough, unspecified: Secondary | ICD-10-CM

## 2012-12-13 LAB — PULMONARY FUNCTION TEST

## 2012-12-13 NOTE — Patient Instructions (Addendum)
Only use your albuterol as a rescue medication to be used if you can't catch your breath by resting or doing a relaxed purse lip breathing pattern. The less you use it, the better it will work when you need it.  We need to see you if needing it more than twice weekly   For cough mucinex up to  1200 mg every 12 hours as needed  We will stop 02 effective today  Please remember to go to the  x-ray department downstairs for your tests - we will call you with the results when they are available.     Pulmonary follow up is as needed

## 2012-12-13 NOTE — Progress Notes (Signed)
Subjective:     Patient ID: Karina Oneal, female   DOB: October 29, 1955   MRN: [id number]     Brief patient profile:  57  yowf quit smoking October 24 2012 with h/o acei cough referred to pulmonary 11/11/2012 post Hosp with nl pft's 12/13/2012    Admit date: 11/02/2012  Discharge date: 11/06/2012  Recommendations for Outpatient Follow-up:  1. Pt will need to follow up with PCP in 1 weeks post discharge 2. Please obtain BMP to evaluate electrolytes and kidney function 3. Please also check CBC to evaluate Hg and Hct levels 4. Please check ambulatory pulse oximetry to assess patient's continued need for supplemental oxygen Discharge Diagnoses:  Principal Problem:  HCAP (healthcare-associated pneumonia)  Active Problems:  Leukocytosis, unspecified  HCAP  -Continue empiric vancomycin, cefepime, azithromycin--the patient had 5 days of intravenous antibiotics in hospital  -The patient was discharged home with levofloxacin 750 mg daily x 5 additional days  -Urine Streptococcus pneumoniae antigen negative--neg  -Follow blood cultures--neg to date  -Clinically feeling better  -WBC improving from 23.9 to 14.7 on the day of discharge  -Ambulatory pulse oximetry showed the patient had oxygen desaturation to 84% on room air. The patient was set up with home oxygen--2 liters  Diabetes mellitus type 2  -Hemoglobin A1c 7.1 in February 2014  -Repeat hemoglobin A1c--8.2 (11/04/12)  -Placed patient on NovoLog sliding scale as her sugars are significantly elevated due to steroids  -Patient will likely need to be discharged on oral medications--she is amenable to starting metformin at the time of discharge--risks, benefits, and alternatives were discussed with the patient, and the patient understood and agreed to follow treatment protocol  -She'll be discharged on metformin 500 mg twice a day  -The patient was instructed to followup with primary care provider for further titration of her diabetic regimen  Coronary  artery disease  -Status post drug-eluting stent to RCA 05/26/2011, drug-eluting stent to the proximal LAD 12/16/2011  -Continue aspirin and Plavix  Hypertension  -Continue losartan and atenolol and amlodipine  -Remained stable  Depression  -Continue Zoloft  COPD  -Bronchodilators every 6 hours around-the-clock  - discharged home with Spiriva--patient stated she had rescue albuterol inhaler at home  -Patient received one dose of Solu-Medrol 125 mg IV x1 on the day of admission in ED  -Tobacco cessation was discussed  -The patient continued to have a mild amount of wheezing on examination--she was given prednisone 50 mg x1 on the day of discharge  -The patient will be discharged home with prednisone 50 mg daily x4 additional days  Family Communication: Pt at beside  Disposition Plan: Home when medically stable  Antibiotics:  Vancomycin 11/02/2012>>> 11/06/2012  Cefepime 11/02/2012>>> 11/06/2012  Azithromycin 11/02/2012>>> 11/06/2012 Discharge Condition: Stable  Disposition: home  Diet: 1800-2000-calorie ADA diet  Wt Readings from Last 3 Encounters:   11/02/12  104.872 kg (231 lb 3.2 oz)   05/23/12  107.23 kg (236 lb 6.4 oz)   04/08/12  110.224 kg (243 lb)   History of present illness:  57 year old female with a history of diet controlled diabetes mellitus, hypertension, hyperlipidemia, COPD with continued tobacco use presented with four-day history of worsening shortness of breath. The patient was previously treated with Omnicef for 10 days by her primary care physician. The patient also received a short course of prednisone which she finished on 10/26/2012. There was no significant improvement. The patient was started on moxifloxacin which she finished a five-day course. She was only slightly better. In emergency department,  the patient was found to have chest x-ray with bilateral multifocal infiltrates, WBC 23.2 with hypoxemia and wheezing. The patient received one dose of  methylprednisolone, 125 mg IV. The patient was started on vancomycin, cefepime, azithromycin. She states that she quit smoking 15 days ago. Previously smoked one to 2 packs per day x40 years. Patient denies weight gain, orthopnea, PND, worsening peripheral edema. The patient's initial WBC count did not significantly improved. This was thought to be due to the patient's initial dose of IV Solu-Medrol in the emergency department, 125 mg. Clinically, the patient gradually improved. She was continued on intravenous vancomycin, cefepime, and azithromycin. Her WBC count eventually decreased. The patient continued to improve but continued to have some dyspnea on exertion. This was thought to be due to in part from deconditioning. Ambulatory pulse oximetry showed that the patient had oxygen saturation to 84% on room air. 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  Finished pred 1 day prior to OV  Finishing levaquin on day of ov > min yellow still all day  New spriva maint/ alb down 2 x daily .  hoarseness is nl for years.  sats as low as 84 walking, stopped using 02  rec Ok to self titrate 02 for sat > 90% symbicort 160 Take 2 puffs first thing in am and then another 2 puffs about 12 hours later. Continue spiriva for now each am  Only use your albuterol as a rescue medication   12/13/2012 f/u ov/[employer] re cough/ on nothing but occ saba/ stopped all 02 months ago Chief Complaint  Patient presents with  . Follow-up    with PFT.  Cough is better but does still have a prod cough with dark greenish brown mucus and very little wheezing.  No SOB, chest tightness, or chest pain.     wants 02 picked up  Denies cough/ congestion flare in am, only brown mucus p drinking coffee  Not limited by breathing, rare saba   No obvious daytime variabilty  or cp or chest tightness, subjective wheeze overt sinus or hb symptoms. No unusual exp hx or h/o childhood pna/ asthma or knowledge of premature birth.   Sleeping ok without nocturnal  or early am exacerbation  of respiratory  c/o's or need for noct saba. Also denies any obvious fluctuation of symptoms with weather or environmental changes or other aggravating or alleviating factors except as outlined above   Current Medications, Allergies, Past Medical History, Past Surgical History, Family History, and Social History were reviewed in [employer] record.  ROS  The following are not active complaints unless bolded sore throat, dysphagia, dental problems, itching, sneezing,  nasal congestion or excess/ purulent secretions, ear ache,   fever, chills, sweats, unintended wt loss, pleuritic or exertional cp, hemoptysis,  orthopnea pnd or leg swelling, presyncope, palpitations, heartburn, abdominal pain, anorexia, nausea, vomiting, diarrhea  or change in bowel or urinary habits, change in stools or urine, dysuria,hematuria,  rash, arthralgias, visual complaints, headache, numbness weakness or ataxia or problems with walking or coordination,  change in mood/affect or memory.             Objective:   Physical Exam   slt hoarse  amb obese wf nad  12/13/2012      229   Wt Readings  from Last 3 Encounters:  11/11/12 231 lb (104.781 kg)  11/02/12 231 lb 3.2 oz (104.872 kg)  05/23/12 236 lb 6.4 oz (107.23 kg)    HEENT: nl dentition, turbinates, and orophanx. Nl external ear canals without cough reflex   NECK :  without JVD/Nodes/TM/ nl carotid upstrokes bilaterally   LUNGS: no acc muscle use, clear to A and P bilaterally without cough on insp or exp maneuvers   CV:  RRR  no s3 or murmur or increase in P2, no edema   ABD:  soft and nontender with nl excursion in the supine position. No bruits or organomegaly, bowel sounds nl  MS:  warm without deformities, calf  tenderness, cyanosis or clubbing  SKIN: warm and dry without lesions    NEURO:  alert, approp, no deficits        CXR  12/13/2012 :   Minimal chronic peribronchial thickening. No acute infiltrate.      Assessment:

## 2012-12-13 NOTE — Assessment & Plan Note (Signed)
-   hfa 75% 11/11/2012  - 12/13/2012 PFT's wnl including mid flows and DLCO  Minimal residual cough s/p smoking cessation with nl pfts > rec maintain off cigs and use mucinex prn  Pulmonary f/u is prn at this point

## 2012-12-13 NOTE — Telephone Encounter (Signed)
Result Notes    Notes Recorded by Nyoka Cowden, MD on 12/13/2012 at 10:44 AM Call pt: Reviewed cxr and no acute change so no change in recommendations made at ov - no further f/u needed     I spoke with patient about results and she verbalized understanding and had no questions

## 2012-12-13 NOTE — Assessment & Plan Note (Signed)
See admit 11/02/12 > Completely resolved on f/u cxr 12/13/2012 All 02 d/c 'd effective today

## 2012-12-13 NOTE — Progress Notes (Signed)
PFT done today. 

## 2012-12-27 ENCOUNTER — Encounter: Payer: Self-pay | Admitting: Internal Medicine

## 2013-01-23 ENCOUNTER — Telehealth: Payer: Self-pay | Admitting: Internal Medicine

## 2013-01-23 NOTE — Telephone Encounter (Signed)
Received request from Nurse, documents faxed for surgical clearance. To: Jarold Song  Fax number: [phone] Attention: 01/23/13/KM

## 2013-02-10 ENCOUNTER — Telehealth: Payer: Self-pay | Admitting: Internal Medicine

## 2013-02-10 NOTE — Telephone Encounter (Signed)
New problem   Need to verify if pt can stop Plavix b/f her colonoscopy. Please advise

## 2013-02-10 NOTE — Telephone Encounter (Signed)
I spoke with [facility] and they did get earlier fax from our office that pt can hold plavix prior to colonoscopy.  They contacted the pt with these instructions and the pt said that Dr Tenny Craw has told her she cannot stop plavix.  [facility] would like a clarification if the pt can or cannot hold plavix prior to procedure.  I will forward this message to Dr Tenny Craw.

## 2013-02-12 NOTE — Telephone Encounter (Signed)
OK to hold plavix 5 days  prior to procedure  Last stent was 11/2011.  Resume after.

## 2013-02-13 NOTE — Telephone Encounter (Signed)
Paperwork placed in medical records for faxing to [phone].

## 2013-04-12 ENCOUNTER — Other Ambulatory Visit: Payer: Self-pay | Admitting: Gastroenterology

## 2013-04-27 DIAGNOSIS — R7303 Prediabetes: Secondary | ICD-10-CM

## 2013-04-27 HISTORY — DX: Prediabetes: R73.03

## 2013-06-02 ENCOUNTER — Encounter: Payer: Self-pay | Admitting: Internal Medicine

## 2013-09-18 ENCOUNTER — Encounter (HOSPITAL_COMMUNITY): Payer: Self-pay | Admitting: Emergency Medicine

## 2013-09-18 ENCOUNTER — Inpatient Hospital Stay (HOSPITAL_COMMUNITY)
Admission: EM | Admit: 2013-09-18 | Discharge: 2013-09-19 | DRG: 287 | Disposition: A | Discharge status: Home / Self Care | Payer: 59 | Attending: Internal Medicine | Admitting: Internal Medicine

## 2013-09-18 DIAGNOSIS — Z9861 Coronary angioplasty status: Secondary | ICD-10-CM

## 2013-09-18 DIAGNOSIS — K219 Gastro-esophageal reflux disease without esophagitis: Secondary | ICD-10-CM | POA: Diagnosis present

## 2013-09-18 DIAGNOSIS — J449 Chronic obstructive pulmonary disease, unspecified: Secondary | ICD-10-CM | POA: Diagnosis present

## 2013-09-18 DIAGNOSIS — J4489 Other specified chronic obstructive pulmonary disease: Secondary | ICD-10-CM | POA: Diagnosis present

## 2013-09-18 DIAGNOSIS — Z825 Family history of asthma and other chronic lower respiratory diseases: Secondary | ICD-10-CM

## 2013-09-18 DIAGNOSIS — Z7982 Long term (current) use of aspirin: Secondary | ICD-10-CM

## 2013-09-18 DIAGNOSIS — E119 Type 2 diabetes mellitus without complications: Secondary | ICD-10-CM | POA: Diagnosis present

## 2013-09-18 DIAGNOSIS — Z6838 Body mass index (BMI) 38.0-38.9, adult: Secondary | ICD-10-CM

## 2013-09-18 DIAGNOSIS — Z87891 Personal history of nicotine dependence: Secondary | ICD-10-CM

## 2013-09-18 DIAGNOSIS — Z888 Allergy status to other drugs, medicaments and biological substances status: Secondary | ICD-10-CM

## 2013-09-18 DIAGNOSIS — Z79899 Other long term (current) drug therapy: Secondary | ICD-10-CM

## 2013-09-18 DIAGNOSIS — E669 Obesity, unspecified: Secondary | ICD-10-CM | POA: Diagnosis present

## 2013-09-18 DIAGNOSIS — Z882 Allergy status to sulfonamides status: Secondary | ICD-10-CM

## 2013-09-18 DIAGNOSIS — Z881 Allergy status to other antibiotic agents status: Secondary | ICD-10-CM

## 2013-09-18 DIAGNOSIS — Z836 Family history of other diseases of the respiratory system: Secondary | ICD-10-CM

## 2013-09-18 DIAGNOSIS — Z9851 Tubal ligation status: Secondary | ICD-10-CM

## 2013-09-18 DIAGNOSIS — Z8249 Family history of ischemic heart disease and other diseases of the circulatory system: Secondary | ICD-10-CM

## 2013-09-18 DIAGNOSIS — E785 Hyperlipidemia, unspecified: Secondary | ICD-10-CM | POA: Diagnosis present

## 2013-09-18 DIAGNOSIS — I2510 Atherosclerotic heart disease of native coronary artery without angina pectoris: Principal | ICD-10-CM | POA: Diagnosis present

## 2013-09-18 DIAGNOSIS — I200 Unstable angina: Secondary | ICD-10-CM | POA: Diagnosis present

## 2013-09-18 DIAGNOSIS — Z9884 Bariatric surgery status: Secondary | ICD-10-CM

## 2013-09-18 DIAGNOSIS — I10 Essential (primary) hypertension: Secondary | ICD-10-CM | POA: Diagnosis present

## 2013-09-18 HISTORY — DX: Heart failure, unspecified: I50.9

## 2013-09-18 HISTORY — DX: Type 2 diabetes mellitus without complications: E11.9

## 2013-09-18 LAB — COMPREHENSIVE METABOLIC PANEL
ALT: 36 U/L — ABNORMAL HIGH (ref 0–35)
AST: 29 U/L (ref 0–37)
Albumin: 3.9 g/dL (ref 3.5–5.2)
Alkaline Phosphatase: 116 U/L (ref 39–117)
BUN: 14 mg/dL (ref 6–23)
CALCIUM: 9.6 mg/dL (ref 8.4–10.5)
CO2: 27 meq/L (ref 19–32)
CREATININE: 0.76 mg/dL (ref 0.50–1.10)
Chloride: 99 mEq/L (ref 96–112)
GLUCOSE: 155 mg/dL — AB (ref 70–99)
Potassium: 4.1 mEq/L (ref 3.7–5.3)
Sodium: 139 mEq/L (ref 137–147)
Total Bilirubin: 0.2 mg/dL — ABNORMAL LOW (ref 0.3–1.2)
Total Protein: 7.5 g/dL (ref 6.0–8.3)

## 2013-09-18 LAB — I-STAT TROPONIN, ED: Troponin i, poc: 0 ng/mL (ref 0.00–0.08)

## 2013-09-18 LAB — CBC WITH DIFFERENTIAL/PLATELET
BASOS ABS: 0 10*3/uL (ref 0.0–0.1)
Basophils Relative: 0 % (ref 0–1)
EOS PCT: 2 % (ref 0–5)
Eosinophils Absolute: 0.3 10*3/uL (ref 0.0–0.7)
HEMATOCRIT: 41.7 % (ref 36.0–46.0)
HEMOGLOBIN: 13.9 g/dL (ref 12.0–15.0)
LYMPHS ABS: 2.7 10*3/uL (ref 0.7–4.0)
LYMPHS PCT: 25 % (ref 12–46)
MCH: 30.6 pg (ref 26.0–34.0)
MCHC: 33.3 g/dL (ref 30.0–36.0)
MCV: 91.9 fL (ref 78.0–100.0)
MONO ABS: 0.7 10*3/uL (ref 0.1–1.0)
MONOS PCT: 7 % (ref 3–12)
Neutro Abs: 7.3 10*3/uL (ref 1.7–7.7)
Neutrophils Relative %: 66 % (ref 43–77)
Platelets: 233 10*3/uL (ref 150–400)
RBC: 4.54 MIL/uL (ref 3.87–5.11)
RDW: 13.4 % (ref 11.5–15.5)
WBC: 11 10*3/uL — AB (ref 4.0–10.5)

## 2013-09-18 MED ORDER — ASPIRIN 81 MG PO CHEW
324.0000 mg | CHEWABLE_TABLET | Freq: Once | ORAL | Status: AC
  Administered 2013-09-19: 243 mg via ORAL
  Filled 2013-09-18: qty 4

## 2013-09-18 NOTE — ED Provider Notes (Signed)
anCSN: [id number]     Arrival date & time 09/18/13  2241 History   First MD Initiated Contact with Patient 09/18/13 2328     Chief Complaint  Patient presents with  . 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 She has had 2 coronary stents in the past and had been on Plavix until several months ago when she stopped taking this prior colonoscopy. She has been pain-free and symptom-free since that time until this evening at work where she has had for discrete episodes of a chest pain, located in the midchest, radiation up into the neck, not associated with shortness of breath or nausea but it is associated with diaphoresis. Several times this has been related with exertion but sometimes it comes on at rest and lasts several minutes when it occurs. She denies peripheral edema. Currently the patient is asymptomatic other than feeling sweaty. Her cardiologist is Dr. Dietrich Pates.   Patient is a 58 y.o. female presenting with chest pain. The history is provided by the patient and medical records.  Chest Pain   Past Medical History  Diagnosis Date  . Hypertension   . Diverticulitis   . Obesity   . GERD (gastroesophageal reflux disease)   . CAD (coronary artery disease)     a.  PCI 1/13: s/p IVUS-guided DES to RCA;   b. LHC 8/13: pLAD 70-80%, irregs in CFX, pRCA stent patent; FFR abnormal at LAD lesion ==> PCI: Xence Xpedition DES to pLAD ;  c.  EF 66% by nuclear study 1/13 and EF 55-65% by [facility] 1/13  . Dyslipidemia   . Anginal pain   . COPD (chronic obstructive pulmonary disease)   . Shortness of breath   . Pneumonia    Past Surgical History  Procedure Laterality Date  . Tubal ligation    . Foot surgery    . Laparoscopic gastric banding    . Coronary angioplasty  with stent placement      LAD 05/2011  . Diagnostic laparoscopy     Family History  Problem Relation Age of Onset  . Asthma Father   . Emphysema Father 67  . Coronary artery disease Mother 40   History  Substance Use Topics  . Smoking status: Former Smoker -- 0.50 packs/day for 40 years    Types: Cigarettes    Quit date: 10/24/2012  . Smokeless tobacco: Never Used  . Alcohol Use: Yes     Comment: occasionally   OB History   Grav Para Term Preterm Abortions TAB SAB Ect Mult Living                 Review of Systems  Cardiovascular: Positive for chest pain.  All other systems reviewed and are negative.     Allergies  Atorvastatin; Ciprofloxacin; Lisinopril; and Sulfa antibiotics  Home Medications   Prior to Admission medications   Medication Sig Start Date End Date Taking? Authorizing Provider  albuterol (PROVENTIL HFA;VENTOLIN HFA) 108 (90 BASE) MCG/ACT inhaler Inhale 2 puffs into the lungs every 6 (six) hours as needed for wheezing.    Historical Provider, MD  amLODipine (NORVASC) 5 MG tablet Take 5 mg by mouth daily.    Historical Provider, MD  aspirin 81 MG EC tablet Take 81 mg by mouth daily.      Historical Provider, MD  atenolol (  TENORMIN) 100 MG tablet Take 100 mg by mouth daily.      Historical Provider, MD  budesonide-formoterol (SYMBICORT) 160-4.5 MCG/ACT inhaler Take 2 puffs first thing in am and then another 2 puffs about 12 hours later. 11/11/12   Nyoka Cowden, MD  buPROPion [facility] SR) 150 MG 12 hr tablet Take 150 mg by mouth daily.    Historical Provider, MD  clopidogrel (PLAVIX) 75 MG tablet Take 1 tablet (75 mg total) by mouth daily with breakfast. 11/06/12   Catarina Hartshorn, MD  Coenzyme Q10 (CO Q 10 PO) Take 1 tablet by mouth daily.    Historical Provider, MD  guaiFENesin (MUCINEX) 600 MG 12 hr tablet Take 1,200 mg by mouth 2 (two) times daily.    Historical Provider, MD  hydrochlorothiazide 25 MG tablet Take 25 mg by mouth daily.      Historical Provider,  MD  losartan (COZAAR) 100 MG tablet Take 100 mg by mouth daily.    Historical Provider, MD  metFORMIN (GLUCOPHAGE) 500 MG tablet Take 1 tablet (500 mg total) by mouth 2 (two) times daily with a meal. 11/06/12   Catarina Hartshorn, MD  nitroGLYCERIN (NITROSTAT) 0.4 MG SL tablet Place 0.4 mg under the tongue every 5 (five) minutes as needed. For chest pain    Historical Provider, MD  rosuvastatin (CRESTOR) 5 MG tablet Take 5 mg by mouth as directed.    Historical Provider, MD  sertraline (ZOLOFT) 50 MG tablet Take 50 mg by mouth daily.    Historical Provider, MD  tiotropium (SPIRIVA HANDIHALER) 18 MCG inhalation capsule Place 1 capsule (18 mcg total) into inhaler and inhale daily. 11/06/12   Catarina Hartshorn, MD   BP 137/54  Pulse 55  Temp(Src) 98.4 F (36.9 C) (Oral)  Resp 21  Ht 5\' 5"  (1.651 m)  Wt 225 lb (102.059 kg)  BMI 37.44 kg/m2  SpO2 98% Physical Exam  Nursing note and vitals reviewed. Constitutional: She appears well-developed and well-nourished. No distress.  HENT:  Head: Normocephalic and atraumatic.  Mouth/Throat: Oropharynx is clear and moist. No oropharyngeal exudate.  Eyes: Conjunctivae and EOM are normal. Pupils are equal, round, and reactive to light. Right eye exhibits no discharge. Left eye exhibits no discharge. No scleral icterus.  Neck: Normal range of motion. Neck supple. No JVD present. No thyromegaly present.  Cardiovascular: Normal rate, regular rhythm, normal heart sounds and intact distal pulses.  Exam reveals no gallop and no friction rub.   No murmur heard. Pulmonary/Chest: Effort normal and breath sounds normal. No respiratory distress. She has no wheezes. She has no rales.  Abdominal: Soft. Bowel sounds are normal. She exhibits no distension and no mass. There is no tenderness.  Musculoskeletal: Normal range of motion. She exhibits no edema and no tenderness.  Lymphadenopathy:    She has no cervical adenopathy.  Neurological: She is alert. Coordination normal.  Skin:  Skin is warm and dry. No rash noted. No erythema.  Psychiatric: She has a normal mood and affect. Her behavior is normal.    ED Course  Procedures (including critical care time) Labs Review Labs Reviewed  CBC WITH DIFFERENTIAL - Abnormal; Notable for the following:    WBC 11.0 (*)    All other components within normal limits  COMPREHENSIVE METABOLIC PANEL - Abnormal; Notable for the following:    Glucose, Bld 155 (*)    ALT 36 (*)    Total Bilirubin 0.2 (*)    All other components within normal limits  I-STAT TROPOININ,  ED    Imaging Review No results found.   EKG Interpretation   Date/Time:  Monday Sep 18 2013 22:45:09 EDT Ventricular Rate:  61 PR Interval:  140 QRS Duration: 90 QT Interval:  440 QTC Calculation: 442 R Axis:   58 Text Interpretation:  Normal sinus rhythm Normal ECG since last tracing no  significant change Confirmed by Metztli Sachdev  MD, Donivin Wirt ([id number]) on 09/18/2013  11:49:46 PM      MDM   Final diagnoses:  Unstable angina    The patient has normal lung and heart sounds, her exam is benign and her EKG is nonischemic however her history of having prior stenting, being off anticoagulation and having intermittent chest pain this evening is concerning for recurrent coronary obstructive disease. She will be given a full dose of aspirin, cardiac monitoring, supplemental oxygen, chest x-ray.  Troponin is normal, labs unremarkable, will discuss with cardiology and anticipate admission to the hospital.  Discussed with cardiology who will see the patient in the emergency department and requests heparin to be given as the thought that this is unstable angina is a strong possibility.  Meds given in ED:  Medications  heparin bolus via infusion 4,000 Units (not administered)  heparin ADULT infusion 100 units/mL (25000 units/250 mL) (not administered)  aspirin chewable tablet 324 mg (243 mg Oral Given 09/19/13 0000)      Vida RollerBrian D Shandy Vi, MD 09/19/13 [phone]

## 2013-09-18 NOTE — ED Notes (Signed)
Pt states that she came to work at 19:00 with chest discomfort. Pt has had 2 previous stents first 2 years ago, 2nd a year ago. Pt states that the pain she is having is similar to the pain she had when needing stents. Pt states that she has center chest pain. Pt states 1 81mg  aspirin per day and is no loner on her blood thinner.

## 2013-09-19 ENCOUNTER — Encounter (HOSPITAL_COMMUNITY)
Admission: EM | Disposition: A | Discharge status: Home / Self Care | Payer: Self-pay | Source: Home / Self Care | Attending: Internal Medicine

## 2013-09-19 ENCOUNTER — Encounter (HOSPITAL_COMMUNITY): Payer: Self-pay | Admitting: General Practice

## 2013-09-19 DIAGNOSIS — K219 Gastro-esophageal reflux disease without esophagitis: Secondary | ICD-10-CM | POA: Diagnosis present

## 2013-09-19 DIAGNOSIS — E785 Hyperlipidemia, unspecified: Secondary | ICD-10-CM

## 2013-09-19 DIAGNOSIS — I200 Unstable angina: Secondary | ICD-10-CM | POA: Insufficient documentation

## 2013-09-19 DIAGNOSIS — J449 Chronic obstructive pulmonary disease, unspecified: Secondary | ICD-10-CM | POA: Diagnosis present

## 2013-09-19 DIAGNOSIS — I2510 Atherosclerotic heart disease of native coronary artery without angina pectoris: Secondary | ICD-10-CM

## 2013-09-19 DIAGNOSIS — R079 Chest pain, unspecified: Secondary | ICD-10-CM

## 2013-09-19 HISTORY — PX: LEFT HEART CATHETERIZATION WITH CORONARY ANGIOGRAM: SHX5451

## 2013-09-19 LAB — LIPID PANEL
CHOL/HDL RATIO: 3.9 ratio
Cholesterol: 180 mg/dL (ref 0–200)
HDL: 46 mg/dL (ref >39)
LDL Cholesterol: 90 mg/dL (ref 0–99)
Triglycerides: 219 mg/dL — ABNORMAL HIGH (ref <150)
VLDL: 44 mg/dL — ABNORMAL HIGH (ref 0–40)

## 2013-09-19 LAB — GLUCOSE, CAPILLARY
Glucose-Capillary: 129 mg/dL — ABNORMAL HIGH (ref 70–99)
Glucose-Capillary: 164 mg/dL — ABNORMAL HIGH (ref 70–99)
Glucose-Capillary: 108 mg/dL — ABNORMAL HIGH (ref 70–99)
Glucose-Capillary: 104 mg/dL — ABNORMAL HIGH (ref 70–99)

## 2013-09-19 LAB — HEMOGLOBIN A1C
Hgb A1c MFr Bld: 7.5 % — ABNORMAL HIGH (ref <5.7)
Mean Plasma Glucose: 169 mg/dL — ABNORMAL HIGH (ref <117)

## 2013-09-19 LAB — TROPONIN I
Troponin I: <0.3 ng/mL (ref <0.30)
Troponin I: <0.3 ng/mL (ref <0.30)
Troponin I: <0.3 ng/mL (ref <0.30)

## 2013-09-19 LAB — HEPARIN LEVEL (UNFRACTIONATED): Heparin Unfractionated: 0.27 IU/mL — ABNORMAL LOW (ref 0.30–0.70)

## 2013-09-19 LAB — PROTIME-INR
INR: 0.94 (ref 0.00–1.49)
PROTHROMBIN TIME: 12.4 s (ref 11.6–15.2)

## 2013-09-19 SURGERY — LEFT HEART CATHETERIZATION WITH CORONARY ANGIOGRAM
Anesthesia: LOCAL

## 2013-09-19 MED ORDER — ALBUTEROL SULFATE HFA 108 (90 BASE) MCG/ACT IN AERS: 2.0000 | INHALATION_SPRAY | Freq: Four times a day (QID) | RESPIRATORY_TRACT | Status: DC | PRN

## 2013-09-19 MED ORDER — SODIUM CHLORIDE 0.9 % IJ SOLN: 3.0000 mL | INTRAMUSCULAR | Status: DC | PRN

## 2013-09-19 MED ORDER — ASPIRIN 81 MG PO CHEW: 324.0000 mg | CHEWABLE_TABLET | ORAL | Status: DC

## 2013-09-19 MED ORDER — HEPARIN BOLUS VIA INFUSION
4000.0000 [IU] | Freq: Once | INTRAVENOUS | Status: AC
  Administered 2013-09-19: 4000 [IU] via INTRAVENOUS
  Filled 2013-09-19: qty 4000

## 2013-09-19 MED ORDER — NITROGLYCERIN 0.4 MG SL SUBL: 0.4000 mg | SUBLINGUAL_TABLET | SUBLINGUAL | Status: DC | PRN

## 2013-09-19 MED ORDER — ATENOLOL 100 MG PO TABS
100.0000 mg | ORAL_TABLET | Freq: Every day | ORAL | Status: DC
  Administered 2013-09-19: 100 mg via ORAL
  Filled 2013-09-19: qty 1

## 2013-09-19 MED ORDER — INSULIN ASPART 100 UNIT/ML ~~LOC~~ SOLN: 0.0000 [IU] | Freq: Every day | SUBCUTANEOUS | Status: DC

## 2013-09-19 MED ORDER — NITROGLYCERIN 0.2 MG/ML ON CALL CATH LAB
INTRAVENOUS | Status: AC
  Filled 2013-09-19: qty 1

## 2013-09-19 MED ORDER — METFORMIN HCL ER 500 MG PO TB24: 1000.0000 mg | ORAL_TABLET | Freq: Every day | ORAL | Status: DC

## 2013-09-19 MED ORDER — LOSARTAN POTASSIUM 50 MG PO TABS
100.0000 mg | ORAL_TABLET | Freq: Every day | ORAL | Status: DC
  Administered 2013-09-19: 100 mg via ORAL
  Filled 2013-09-19: qty 2

## 2013-09-19 MED ORDER — SODIUM CHLORIDE 0.9 % IV SOLN: 250.0000 mL | INTRAVENOUS | Status: DC | PRN

## 2013-09-19 MED ORDER — ALBUTEROL SULFATE (2.5 MG/3ML) 0.083% IN NEBU: 2.5000 mg | INHALATION_SOLUTION | RESPIRATORY_TRACT | Status: DC | PRN

## 2013-09-19 MED ORDER — MIDAZOLAM HCL 2 MG/2ML IJ SOLN
INTRAMUSCULAR | Status: AC
  Filled 2013-09-19: qty 2

## 2013-09-19 MED ORDER — ASPIRIN 81 MG PO CHEW
81.0000 mg | CHEWABLE_TABLET | Freq: Every day | ORAL | Status: DC
  Administered 2013-09-19: 81 mg via ORAL
  Filled 2013-09-19 (×2): qty 1

## 2013-09-19 MED ORDER — SODIUM CHLORIDE 0.9 % IJ SOLN
3.0000 mL | Freq: Two times a day (BID) | INTRAMUSCULAR | Status: DC
Start: 2013-09-19 — End: 2013-09-19

## 2013-09-19 MED ORDER — INSULIN ASPART 100 UNIT/ML ~~LOC~~ SOLN: 0.0000 [IU] | Freq: Three times a day (TID) | SUBCUTANEOUS | Status: DC

## 2013-09-19 MED ORDER — ASPIRIN 300 MG RE SUPP: 300.0000 mg | RECTAL | Status: DC

## 2013-09-19 MED ORDER — AMLODIPINE BESYLATE 10 MG PO TABS: 10.0000 mg | ORAL_TABLET | Freq: Every day | ORAL | Status: DC

## 2013-09-19 MED ORDER — CANAGLIFLOZIN 100 MG PO TABS: 100.0000 mg | ORAL_TABLET | Freq: Every morning | ORAL | Status: DC

## 2013-09-19 MED ORDER — CANAGLIFLOZIN 100 MG PO TABS: 100.0000 mg | ORAL_TABLET | Freq: Every day | ORAL | Status: DC

## 2013-09-19 MED ORDER — SODIUM CHLORIDE 0.9 % IV SOLN: INTRAVENOUS | Status: DC

## 2013-09-19 MED ORDER — ATORVASTATIN CALCIUM 10 MG PO TABS
10.0000 mg | ORAL_TABLET | Freq: Every day | ORAL | Status: DC
  Filled 2013-09-19: qty 1

## 2013-09-19 MED ORDER — ASPIRIN 81 MG PO CHEW: 81.0000 mg | CHEWABLE_TABLET | ORAL | Status: DC

## 2013-09-19 MED ORDER — BUPROPION HCL ER (SR) 150 MG PO TB12
150.0000 mg | ORAL_TABLET | Freq: Every day | ORAL | Status: DC
  Administered 2013-09-19: 150 mg via ORAL
  Filled 2013-09-19: qty 1

## 2013-09-19 MED ORDER — SERTRALINE HCL 50 MG PO TABS
50.0000 mg | ORAL_TABLET | Freq: Every day | ORAL | Status: DC
  Administered 2013-09-19: 50 mg via ORAL
  Filled 2013-09-19: qty 1

## 2013-09-19 MED ORDER — HYDROCHLOROTHIAZIDE 25 MG PO TABS
25.0000 mg | ORAL_TABLET | Freq: Every day | ORAL | Status: DC
  Filled 2013-09-19: qty 1

## 2013-09-19 MED ORDER — HEPARIN BOLUS VIA INFUSION
4000.0000 [IU] | Freq: Once | INTRAVENOUS | Status: DC
  Filled 2013-09-19: qty 4000

## 2013-09-19 MED ORDER — CLOPIDOGREL BISULFATE 75 MG PO TABS
75.0000 mg | ORAL_TABLET | Freq: Every day | ORAL | Status: DC
  Administered 2013-09-19: 75 mg via ORAL
  Filled 2013-09-19: qty 1

## 2013-09-19 MED ORDER — ASPIRIN EC 81 MG PO TBEC
81.0000 mg | DELAYED_RELEASE_TABLET | Freq: Every day | ORAL | Status: DC
Start: 2013-09-20 — End: 2013-09-19

## 2013-09-19 MED ORDER — HEPARIN SODIUM (PORCINE) 1000 UNIT/ML IJ SOLN
INTRAMUSCULAR | Status: AC
  Filled 2013-09-19: qty 1

## 2013-09-19 MED ORDER — FENTANYL CITRATE 0.05 MG/ML IJ SOLN
INTRAMUSCULAR | Status: AC
  Filled 2013-09-19: qty 2

## 2013-09-19 MED ORDER — VERAPAMIL HCL 2.5 MG/ML IV SOLN
INTRAVENOUS | Status: AC
  Filled 2013-09-19: qty 2

## 2013-09-19 MED ORDER — HEPARIN (PORCINE) IN NACL 100-0.45 UNIT/ML-% IJ SOLN: 12.0000 [IU]/kg/h | INTRAMUSCULAR | Status: DC

## 2013-09-19 MED ORDER — HEPARIN (PORCINE) IN NACL 100-0.45 UNIT/ML-% IJ SOLN
1200.0000 [IU]/h | INTRAMUSCULAR | Status: DC
  Filled 2013-09-19 (×2): qty 250

## 2013-09-19 MED ORDER — AMLODIPINE BESYLATE 5 MG PO TABS
5.0000 mg | ORAL_TABLET | Freq: Every day | ORAL | Status: DC
  Administered 2013-09-19: 5 mg via ORAL
  Filled 2013-09-19: qty 1

## 2013-09-19 MED ORDER — LIDOCAINE HCL (PF) 1 % IJ SOLN
INTRAMUSCULAR | Status: AC
  Filled 2013-09-19: qty 30

## 2013-09-19 MED ORDER — VERAPAMIL HCL 2.5 MG/ML IV SOLN
5.0000 mg | Freq: Once | INTRAVENOUS | Status: DC
  Filled 2013-09-19: qty 2

## 2013-09-19 MED ORDER — CLOPIDOGREL BISULFATE 75 MG PO TABS
600.0000 mg | ORAL_TABLET | Freq: Once | ORAL | Status: AC
  Administered 2013-09-19: 600 mg via ORAL
  Filled 2013-09-19: qty 8

## 2013-09-19 MED ORDER — SODIUM CHLORIDE 0.9 % IV SOLN: 1.0000 mL/kg/h | INTRAVENOUS | Status: DC

## 2013-09-19 MED ORDER — HEPARIN (PORCINE) IN NACL 100-0.45 UNIT/ML-% IJ SOLN
1250.0000 [IU]/h | INTRAMUSCULAR | Status: DC
  Administered 2013-09-19: 1250 [IU]/h via INTRAVENOUS
  Filled 2013-09-19 (×2): qty 250

## 2013-09-19 MED ORDER — HEPARIN (PORCINE) IN NACL 100-0.45 UNIT/ML-% IJ SOLN
1350.0000 [IU]/h | INTRAMUSCULAR | Status: DC
  Filled 2013-09-19 (×2): qty 250

## 2013-09-19 MED ORDER — HEPARIN (PORCINE) IN NACL 2-0.9 UNIT/ML-% IJ SOLN
INTRAMUSCULAR | Status: AC
  Filled 2013-09-19: qty 1500

## 2013-09-19 NOTE — H&P (View-Only) (Signed)
Subjective: No further CP  Objective: Vital signs in last 24 hours: Temp:  [97.7 F (36.5 C)-98.4 F (36.9 C)] 97.9 F (36.6 C) (05/26 0353) Pulse Rate:  [48-58] 55 (05/26 0353) Resp:  [13-21] 20 (05/26 0353) BP: (137-189)/(34-74) 146/49 mmHg (05/26 0353) SpO2:  [95 %-98 %] 95 % (05/26 0353) Weight:  [225 lb (102.059 kg)-232 lb 8 oz (105.461 kg)] 232 lb 8 oz (105.461 kg) (05/26 0135)    Intake/Output from previous day: 05/25 0701 - 05/26 0700 In: -  Out: 600 [Urine:600] Intake/Output this shift:    Medications Current Facility-Administered Medications  Medication Dose Route Frequency Provider Last Rate Last Dose  . 0.9 %  sodium chloride infusion  250 mL Intravenous PRN Dolores Patty, MD      . 0.9 %  sodium chloride infusion   Intravenous Continuous Dolores Patty, MD 75 mL/hr at 09/19/13 3833    . albuterol (PROVENTIL) (2.5 MG/3ML) 0.083% nebulizer solution 2.5 mg  2.5 mg Nebulization Q4H PRN Dolores Patty, MD      . amLODipine (NORVASC) tablet 5 mg  5 mg Oral Daily Dolores Patty, MD      . aspirin chewable tablet 324 mg  324 mg Oral NOW Dolores Patty, MD       Or  . aspirin suppository 300 mg  300 mg Rectal NOW Dolores Patty, MD      . aspirin chewable tablet 81 mg  81 mg Oral Daily Dolores Patty, MD      . Melene Muller ON 09/20/2013] aspirin chewable tablet 81 mg  81 mg Oral Pre-Cath Dolores Patty, MD      . Melene Muller ON 09/20/2013] aspirin EC tablet 81 mg  81 mg Oral Daily Dolores Patty, MD      . atenolol (TENORMIN) tablet 100 mg  100 mg Oral Daily Dolores Patty, MD      . buPROPion [facility] SR) 12 hr tablet 150 mg  150 mg Oral Daily Dolores Patty, MD      . Melene Muller ON 09/20/2013] Canagliflozin TABS 100 mg  100 mg Oral Daily Dolores Patty, MD      . clopidogrel (PLAVIX) tablet 75 mg  75 mg Oral Q breakfast Dolores Patty, MD      . heparin ADULT infusion 100 units/mL (25000 units/250 mL)  1,250 Units/hr  Intravenous Continuous Vida Roller, MD 12.5 mL/hr at 09/19/13 0102 1,250 Units/hr at 09/19/13 0102  . heparin bolus via infusion 4,000 Units  4,000 Units Intravenous Once Dolores Patty, MD       And  . heparin ADULT infusion 100 units/mL (25000 units/250 mL)  1,200 Units/hr Intravenous Continuous Dolores Patty, MD 12 mL/hr at 09/19/13 0146 1,200 Units/hr at 09/19/13 0146  . hydrochlorothiazide (HYDRODIURIL) tablet 25 mg  25 mg Oral Daily Bevelyn Buckles Bensimhon, MD      . insulin aspart (novoLOG) injection 0-15 Units  0-15 Units Subcutaneous TID WC Dolores Patty, MD      . insulin aspart (novoLOG) injection 0-5 Units  0-5 Units Subcutaneous QHS Dolores Patty, MD      . losartan (COZAAR) tablet 100 mg  100 mg Oral Daily Bevelyn Buckles Bensimhon, MD      . nitroGLYCERIN (NITROSTAT) SL tablet 0.4 mg  0.4 mg Sublingual Q5 min PRN Dolores Patty, MD      . nitroGLYCERIN (NITROSTAT) SL tablet 0.4 mg  0.4 mg Sublingual Q5 Min  x 3 PRN Daniel R Bensimhon, MD      . sertraline (ZOLOFT) tablet 50 mg  50 mg Oral Daily Daniel R Bensimhon, MD      . sodium chloride 0.9 % injection 3 mL  3 mL Intravenous Q12H Daniel R Bensimhon, MD      . sodium chloride 0.9 % injection 3 mL  3 mL Intravenous PRN Daniel R Bensimhon, MD        PE: General appearance: alert, cooperative and no distress Lungs: clear to auscultation bilaterally Heart: regular rate and rhythm, S1, S2 normal, no murmur, click, rub or gallop Extremities: No LEE Pulses: 2+ and symmetric Skin: Warm and dry Neurologic: Grossly normal  Lab Results:   Recent Labs  09/18/13 2255  WBC 11.0*  HGB 13.9  HCT 41.7  PLT 233   BMET  Recent Labs  09/18/13 2255  NA 139  K 4.1  CL 99  CO2 27  GLUCOSE 155*  BUN 14  CREATININE 0.76  CALCIUM 9.6    Cardiac Panel (last 3 results)  Recent Labs  09/19/13 0226  TROPONINI <0.30    Assessment/Plan 58 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   Last cath 8/13:  Left mainstem: Normal  Left anterior descending (LAD): There is a 70-80% fusiform lesion in the proximal LAD with moderate calcification.  Left circumflex (LCx): There are minor irregularities less than 10%.  Right coronary artery (RCA): The right coronary is a large dominant vessel. The stent in the proximal vessel is widely patent without significant disease.  LAD stented with 3.5x 18 Xience DES  Says she has done well since LAD stented. Stopped Plavix for colonoscopy in 12/14 and didn't restart. Over past 2-3 days progressive episodes of CP radiating to jaw with rest and exertion. Tonight they got more frequent so came to ER. Reminiscent of previous angina. ECG and CE x 1 are ok. Now pain free.   Active Problems: 1. [place]  2. CAD  -- s/p DES to RCA 1/13  -- s/p DES to LAD 8/13 (Xience 3.5x18 Xience)  3. COPD  4. DM2  5. Obesity  Plan:   No further CP. Left heart cath today.  Heparin, plavix, asa, BB.  SS insulin for DM.  Had lap band four years ago.  Weight fluctuates.  Does not seem to be interested in exercise.  Bp mildly elevated.  Continue to monitor.  Consider increasing amlodipine to10mg.   LOS: 1 day    Bryan Hager PA-C 09/19/2013 7:27 AM  As above, patient seen and examined. She is presently pain-free. Enzymes negative. Plan to proceed with cardiac catheterization later today. The risks and benefits were discussed and she agrees to proceed. Hold Glucophage 48 hours following procedure. Continue aspirin, Plavix, heparin and beta blocker. Resume Crestor and increased to 40 mg daily at DC. Change amlodipine to 10 mg daily for improved blood pressure control. Ortencia Askari S Ari Engelbrecht  

## 2013-09-19 NOTE — ED Notes (Signed)
Pt. Reports intermittent chest pain starting x3-4 days ago. Reports pain is sharp that radiates to jaw and back lasting approx 2-3 minutes. Pt. Denies SOB, N/V, diaphoresis. Denies chest pain at this time. Hx of 2 stents placed

## 2013-09-19 NOTE — Discharge Summary (Signed)
Discharge Summary   Patient ID: Karina ServeSarah S Oneal MRN: [id number], DOB/AGE: April 04, 1956 58 y.o. Admit date: 09/18/2013 D/C date:     09/19/2013  Primary Cardiologist: Dr. Tenny Crawoss  Principal Problem:   CAD (coronary artery disease) Active Problems:   Obesity Body mass index is 38.69 kg/(m^2).   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    The patient stated that she had done well since her LAD was stented in 8/13. She stopped Plavix for a colonoscopy in 12/14 and did not restart. She reported that over past 2-3 days she had experienced progressive episodes of chest pain radiating to jaw with rest and exertion. They were reminiscent of previous angina and on the night of admission they got more frequent so she presented to the ED.   Hospital Course: Her chest pain was concerning for [place]. So she was admitted for observation and started on IV heparin and reloaded on Plavix. She ruled out for MI with serial negative cardiac enzymes and an ECG with no ST or TW changes. However; in the setting of known coronary disease and chest pain reminiscent of previous angina it was elected to proceed with cardiac cath. She underwent LHC on 09/19/13 which revealed:   1. Widely patent stents in the LAD and right coronary arteries.   2. Preserved LV systolic function.   3. Elevated LVEDP  Recommendations: Continue medical therapy with antihypertensive treatment.   The patient has had an uncomplicated hospital course and is recovering well. The radial catheter site is stable. She has been seen  by Dr. Excell Seltzerooper today and deemed ready for discharge home. Discharge medications are listed below. Her amlodipine was increased from 5mg  to 10mg  and her Metformin will be resumed 48 hours post cardiac cath to prevent CIN. She no longer needs to be on plavix per Dr. Excell Seltzerooper as her stents are widely patent after 1 year of DAPT.   Discharge Vitals: Blood pressure 143/94, pulse 51, temperature 97.7 F (36.5 C), temperature source Oral, resp. rate 20, height 5\' 5"  (1.651 m), weight 232 lb 8 oz (105.461 kg), SpO2 100.00%.  Labs: Lab Results  Component Value Date   WBC 11.0* 09/18/2013   HGB 13.9 09/18/2013   HCT 41.7 09/18/2013   MCV 91.9 09/18/2013   PLT 233 09/18/2013     Recent Labs Lab 09/18/13 2255  NA 139  K 4.1  CL 99  CO2 27  BUN 14  CREATININE 0.76  CALCIUM 9.6  PROT 7.5  BILITOT 0.2*  ALKPHOS 116  ALT 36*  AST 29  GLUCOSE 155*    Recent Labs  09/19/13 0226 09/19/13 0834 09/19/13 1320  TROPONINI <0.30 <0.30 <0.30   Lab Results  Component Value Date   CHOL 180 09/19/2013   HDL 46 09/19/2013   LDLCALC 90 09/19/2013   TRIG 219* 09/19/2013     Diagnostic Studies/Procedures    Cardiac Catheterization Procedure Note  Name: Karina Oneal  MRN: [id number]  DOB: April 04, 1956  Procedure: Left Heart Cath, Selective Coronary Angiography, LV angiography  Indication: Chest pain, known CAD  Procedural Details: The right wrist was prepped, draped, and anesthetized with 1% lidocaine. Using the modified Seldinger technique, a 5/6 French sheath was introduced into the right radial artery. 3 mg of verapamil was administered through the sheath, weight-based unfractionated heparin was administered intravenously. Standard Judkins catheters were used for selective coronary angiography and left ventriculography. Catheter exchanges were performed over an exchange length guidewire. There were no immediate procedural complications. A TR band was used for radial hemostasis at the completion of the  procedure. The patient was transferred to the post catheterization recovery area for further monitoring.  Procedural Findings:  Hemodynamics:  AO 159/73  LV 152/26  Coronary angiography:  Coronary dominance: right  Left mainstem: The left main is patent with minor ostial narrowing. There is no obstructive disease present  Left anterior descending (LAD): The LAD is normal in caliber. The proximal vessel was stented with no significant in-stent restenosis. There is minor nonobstructive plaque in the mid vessel. The LAD reaches the LV. The diagonal branches are patent.  Left circumflex (LCx): The left circumflex is normal in caliber. The first OM is small. The second OM is medium in caliber. There is no obstructive disease present.  Right coronary artery (RCA): There's mild diffuse calcification present. The RCA is dominant. The stent in the proximal portion is widely patent without significant in-stent restenosis. The PDA and PLA branches are patent without significant disease.  Left ventriculography: Left ventricular systolic function is normal, LVEF is estimated at 55-65%, there is no significant mitral regurgitation  Final Conclusions:  1. Widely patent stents in the LAD and right coronary arteries.  2. Preserved LV systolic function.  3. Elevated LVEDP  Recommendations: Continue medical therapy with antihypertensive treatment. I don't see any indication to resume Plavix as the patient is greater than 12 months out from stenting with widely patent stents.     Discharge Medications     Medication List         albuterol 108 (90 BASE) MCG/ACT inhaler  Commonly known as:  PROVENTIL HFA;VENTOLIN HFA  Inhale 2 puffs into the lungs every 6 (six) hours as needed for wheezing.     amLODipine 10 MG tablet  Commonly known as:  NORVASC  Take 1 tablet (10 mg total) by mouth daily.     aspirin 81 MG EC tablet  Take 81 mg by mouth daily.     atenolol 100 MG tablet  Commonly known as:   TENORMIN  Take 100 mg by mouth daily.     atorvastatin 10 MG tablet  Commonly known as:  LIPITOR  Take 10 mg by mouth daily.     CO Q 10 PO  Take 1 tablet by mouth daily.     hydrochlorothiazide 25 MG tablet  Commonly known as:  HYDRODIURIL  Take 25 mg by mouth daily.     INVOKANA 100 MG Tabs  Generic drug:  Canagliflozin  Take 100 mg by mouth every morning.     losartan 100 MG tablet  Commonly known as:  COZAAR  Take 100 mg by mouth daily.     metFORMIN 500 MG 24 hr tablet  Commonly known as:  GLUCOPHAGE-XR  Take 2 tablets (1,000 mg total) by mouth at bedtime.  Start taking on:  09/22/2013     nitroGLYCERIN 0.4 MG SL tablet  Commonly known as:  NITROSTAT  Place 0.4 mg under the tongue every 5 (five) minutes as needed. For chest pain  sertraline 50 MG tablet  Commonly known as:  ZOLOFT  Take 50 mg by mouth daily.     WELLBUTRIN SR 150 MG 12 hr tablet  Generic drug:  buPROPion  Take 150 mg by mouth daily.        Disposition   The patient will be discharged in stable condition to home.  Follow-up Information   Follow up with Dietrich Pates, MD. (Please calll to make an appointment with Dr Tenny Craw or an APP in the next 2weeks-1 month for post hospital follow up)    Specialty:  Cardiology   Contact information:   [address] [place] [address] [phone]         Duration of Discharge Encounter: Greater than 30 minutes including physician and PA time.  Signed, Thereasa Parkin PA-C 09/19/2013, 7:34 PM

## 2013-09-19 NOTE — Progress Notes (Signed)
ANTICOAGULATION CONSULT NOTE - Follow Up Consult  Pharmacy Consult:  Heparin Indication: chest pain/ACS  Allergies  Allergen Reactions  . Atorvastatin Other (See Comments)    Muscle weakness.  Takes crestor 3x week without problem  . Ciprofloxacin Nausea And Vomiting  . Lisinopril Cough  . Sulfa Antibiotics Itching    Patient Measurements: Height: 5\' 5"  (165.1 cm) Weight: 232 lb 8 oz (105.461 kg) IBW/kg (Calculated) : 57 Heparin Dosing Weight: 71 kg  Vital Signs: Temp: 97.9 F (36.6 C) (05/26 0353) Temp src: Oral (05/26 0353) BP: 146/49 mmHg (05/26 0353) Pulse Rate: 55 (05/26 0353)  Labs:  Recent Labs  09/18/13 2255 09/19/13 0226 09/19/13 0834  HGB 13.9  --   --   HCT 41.7  --   --   PLT 233  --   --   LABPROT  --   --  12.4  INR  --   --  0.94  CREATININE 0.76  --   --   TROPONINI  --  <0.30 <0.30    Estimated Creatinine Clearance: 94.7 ml/min (by C-G formula based on Cr of 0.76).     Assessment: 59 YOF admitted with chest pain to continue IV heparin.  Heparin level slightly sub-therapeutic.  No bleeding reported.   Goal of Therapy:  Heparin level 0.3-0.7 units/ml Monitor platelets by anticoagulation protocol: Yes    Plan:  - Increase heparin gtt to 1350 units/hr - Check 6 hr HL - Daily HL / CBC - F/U post cath    Roland Lipke D. Laney Potash, PharmD, BCPS Pager:  [phone] 09/19/2013, 1:20 PM

## 2013-09-19 NOTE — Discharge Instructions (Signed)

## 2013-09-19 NOTE — H&P (Addendum)
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   Last cath 8/13:  Left mainstem: Normal  Left anterior descending (LAD): There is a 70-80% fusiform lesion in the proximal LAD with moderate calcification.  Left circumflex (LCx): There are minor irregularities less than 10%.  Right coronary artery (RCA): The right coronary is a large dominant vessel. The stent in the proximal vessel is widely patent without significant disease. LAD stented with 3.5x 18 Xience DES  Says she has done well since LAD stented. Stopped Plavix for colonoscopy in 12/14 and didn't restart. Over past 2-3 days progressive episodes of CP radiating to jaw with rest and exertion. Tonight they got more frequent so came to ER. Reminiscent of previous angina.  ECG and CE x 1 are ok. Now pain free.     Review of Systems:     Cardiac Review of Systems: {Y] = yes [ ]  = no  Chest Pain [  y  ]  Resting SOB [   ] Exertional SOB  [  ]  Orthopnea [  ]   Pedal Edema [   ]    Palpitations [  ] Syncope  [  ]   Presyncope [   ]  General Review of Systems: [Y] = yes [  ]=no Constitional: recent weight change [  ]; anorexia [  ]; fatigue [  ]; nausea [  ]; night sweats [  ]; fever [  ]; or chills [  ];                                                                                                                                          Dental: poor dentition[  ];   Eye : blurred vision [  ]; diplopia [   ]; vision changes [  ];  Amaurosis fugax[  ]; Resp: cough [  ];  wheezing[  ];  hemoptysis[  ]; shortness of breath[  ]; paroxysmal nocturnal dyspnea[  ]; dyspnea on exertion[  ]; or orthopnea[  ];  GI:  gallstones[  ], vomiting[  ];  dysphagia[  ]; melena[  ];  hematochezia [  ]; heartburn[  ];    GU: kidney stones [  ]; hematuria[  ];   dysuria [  ];  nocturia[  ];  history of     obstruction [  ];  hot flashes [y]                 Skin: rash, swelling[  ];, hair loss[  ];  peripheral edema[  ];  or itching[  ]; Musculosketetal: myalgias[  ];  joint swelling[  ];  joint erythema[  ];  joint pain[ y ];  back pain[  ];  Heme/Lymph: bruising[  ];  bleeding[  ];  anemia[  ];  Neuro: TIA[  ];  headaches[  ];  stroke[  ];  vertigo[  ];  seizures[  ];   paresthesias[  ];  difficulty walking[  ];  Psych:depression[  ]; anxiety[  ];  Endocrine: diabetes[ y ];  thyroid dysfunction[  ];    Past Medical History  Diagnosis Date  . Hypertension   . Diverticulitis   . Obesity   . GERD (gastroesophageal reflux disease)   . CAD (coronary artery disease)     a.  PCI 1/13: s/p IVUS-guided DES to RCA;   b. LHC 8/13: pLAD 70-80%, irregs in CFX, pRCA stent patent; FFR abnormal at LAD lesion ==> PCI: Xence Xpedition DES to pLAD ;  c.  EF 66% by nuclear study 1/13 and EF 55-65% by [facility] 1/13  . Dyslipidemia   . Anginal pain   . COPD (chronic obstructive pulmonary disease)   . Shortness of breath   . Pneumonia     No current facility-administered medications on file prior to encounter.   Current Outpatient Prescriptions on File Prior to Encounter  Medication Sig Dispense Refill  . albuterol (PROVENTIL HFA;VENTOLIN HFA) 108 (90 BASE) MCG/ACT inhaler Inhale 2 puffs into the lungs every 6 (six) hours as needed for wheezing.      Marland Kitchen amLODipine (NORVASC) 5 MG tablet Take 5 mg by mouth daily.      Marland Kitchen aspirin 81 MG EC tablet Take 81 mg by mouth daily.        Marland Kitchen atenolol (TENORMIN) 100 MG tablet Take 100 mg by mouth daily.        Marland Kitchen buPROPion (WELLBUTRIN SR) 150 MG 12 hr tablet Take 150 mg by mouth daily.      . clopidogrel (PLAVIX) 75 MG tablet Take 1 tablet (75 mg total) by mouth daily with breakfast.  30 tablet  0  . Coenzyme Q10 (CO Q 10 PO) Take 1 tablet by mouth daily.      Marland Kitchen guaiFENesin (MUCINEX) 600 MG 12 hr tablet Take 1,200 mg by mouth 2 (two) times daily.      . hydrochlorothiazide 25 MG tablet Take 25  mg by mouth daily.        Marland Kitchen losartan (COZAAR) 100 MG tablet Take 100 mg by mouth daily.      . metFORMIN (GLUCOPHAGE) 500 MG tablet Take 1 tablet (500 mg total) by mouth 2 (two) times daily with a meal.  60 tablet  0  . nitroGLYCERIN (NITROSTAT) 0.4 MG SL tablet Place 0.4 mg under the tongue every 5 (five) minutes as needed. For chest pain      . rosuvastatin (CRESTOR) 5 MG tablet Take 5 mg by mouth as directed.      . sertraline (ZOLOFT) 50 MG tablet Take 50 mg by mouth daily.      Marland Kitchen tiotropium (SPIRIVA HANDIHALER) 18 MCG inhalation capsule Place 1 capsule (18 mcg total) into inhaler and inhale daily.  30 capsule  0   Allergies  Allergen Reactions  . Atorvastatin Other (See Comments)    Muscle weakness.  Takes crestor 3x week without problem  . Ciprofloxacin Nausea And Vomiting  . Lisinopril Cough  . Sulfa Antibiotics Itching    History   Social History  . Marital Status: Divorced    Spouse Name: N/A    Number of Children: 2  . Years of Education: N/A   Occupational History  . RESPIRATORY THERAPIST  Social History Main Topics  . Smoking status: Former Smoker -- 0.50 packs/day for 40 years    Types: Cigarettes    Quit date: 10/24/2012  . Smokeless tobacco: Never Used  . Alcohol Use: Yes     Comment: occasionally  . Drug Use: No  . Sexual Activity: Not Currently    Birth Control/ Protection: Post-menopausal   Other Topics Concern  . Not on file   Social History Narrative   Lives with oldest daughter.    Family History  Problem Relation Age of Onset  . Asthma Father   . Emphysema Father 2557  . Coronary artery disease Mother 760    PHYSICAL EXAM: Filed Vitals:   09/19/13 0008  BP: 137/54  Pulse: 55  Temp:   Resp: 21   General:  Well appearing. No respiratory difficulty HEENT: normal Neck: supple. no JVD. Carotids 2+ bilat; no bruits. No lymphadenopathy or thryomegaly appreciated. Cor: PMI nondisplaced. Regular rate & rhythm. No rubs, gallops or  murmurs. Lungs: clear with mildly prolonged exp phase Abdomen: obese soft, nontender, nondistended. No hepatosplenomegaly. No bruits or masses. Good bowel sounds. Extremities: no cyanosis, clubbing, rash, trace edema Neuro: alert & oriented x 3, cranial nerves grossly intact. moves all 4 extremities w/o difficulty. Affect pleasant.  ECG: NSR 61 No ST-T wave abnormalities.    Results for orders placed during the hospital encounter of 09/18/13 (from the past 24 hour(s))  CBC WITH DIFFERENTIAL     Status: Abnormal   Collection Time    09/18/13 10:55 PM      Result Value Ref Range   WBC 11.0 (*) 4.0 - 10.5 K/uL   RBC 4.54  3.87 - 5.11 MIL/uL   Hemoglobin 13.9  12.0 - 15.0 g/dL   HCT 16.141.7  09.636.0 - 04.546.0 %   MCV 91.9  78.0 - 100.0 fL   MCH 30.6  26.0 - 34.0 pg   MCHC 33.3  30.0 - 36.0 g/dL   RDW 40.913.4  81.111.5 - 91.415.5 %   Platelets 233  150 - 400 K/uL   Neutrophils Relative % 66  43 - 77 %   Neutro Abs 7.3  1.7 - 7.7 K/uL   Lymphocytes Relative 25  12 - 46 %   Lymphs Abs 2.7  0.7 - 4.0 K/uL   Monocytes Relative 7  3 - 12 %   Monocytes Absolute 0.7  0.1 - 1.0 K/uL   Eosinophils Relative 2  0 - 5 %   Eosinophils Absolute 0.3  0.0 - 0.7 K/uL   Basophils Relative 0  0 - 1 %   Basophils Absolute 0.0  0.0 - 0.1 K/uL  COMPREHENSIVE METABOLIC PANEL     Status: Abnormal   Collection Time    09/18/13 10:55 PM      Result Value Ref Range   Sodium 139  137 - 147 mEq/L   Potassium 4.1  3.7 - 5.3 mEq/L   Chloride 99  96 - 112 mEq/L   CO2 27  19 - 32 mEq/L   Glucose, Bld 155 (*) 70 - 99 mg/dL   BUN 14  6 - 23 mg/dL   Creatinine, Ser 7.820.76  0.50 - 1.10 mg/dL   Calcium 9.6  8.4 - 95.610.5 mg/dL   Total Protein 7.5  6.0 - 8.3 g/dL   Albumin 3.9  3.5 - 5.2 g/dL   AST 29  0 - 37 U/L   ALT 36 (*) 0 - 35 U/L   Alkaline Phosphatase 116  39 - 117 U/L   Total Bilirubin 0.2 (*) 0.3 - 1.2 mg/dL   GFR calc non Af Amer >90  >90 mL/min   GFR calc Af Amer >90  >90 mL/min  I-STAT TROPOININ, ED     Status: None    Collection Time    09/18/13 11:07 PM      Result Value Ref Range   Troponin i, poc 0.00  0.00 - 0.08 ng/mL   Comment 3            No results found.   ASSESSMENT: 1. [place] 2. CAD     -- s/p DES to RCA 1/13    -- s/p DES to LAD 8/13 (Xience 3.5x18 Xience) 3. COPD 4. DM2 5. Obesity  PLAN/DISCUSSION:  CP concerning for [place]. Will admit. Start heparin. Reload Plavix. Continue ASA, statin, b-blocker. Cath in am via radial approach. Hold metformin for cath. Cover with SSI.   Daniel R Bensimhon,MD 12:16 AM

## 2013-09-19 NOTE — Progress Notes (Signed)
Patient ambulated in hall tolerated well she is post cath. Right radial level 0 no bleeding at time of discharge post care instructions given and arm board for reminder not to move wrist. Family at bedside to take her home. Phone number for me and the office  provided if she has any concerns tonight or any time. Patient discharge and stable.

## 2013-09-19 NOTE — CV Procedure (Signed)
    Cardiac Catheterization Procedure Note  Name: Karina Oneal MRN: [id number] DOB: [birth year]  Procedure: Left Heart Cath, Selective Coronary Angiography, LV angiography  Indication: Chest pain, known CAD   Procedural Details: The right wrist was prepped, draped, and anesthetized with 1% lidocaine. Using the modified Seldinger technique, a 5/6 French sheath was introduced into the right radial artery. 3 mg of verapamil was administered through the sheath, weight-based unfractionated heparin was administered intravenously. Standard Judkins catheters were used for selective coronary angiography and left ventriculography. Catheter exchanges were performed over an exchange length guidewire. There were no immediate procedural complications. A TR band was used for radial hemostasis at the completion of the procedure.  The patient was transferred to the post catheterization recovery area for further monitoring.  Procedural Findings: Hemodynamics: AO 159/73 LV 152/26  Coronary angiography: Coronary dominance: right  Left mainstem: The left main is patent with minor ostial narrowing. There is no obstructive disease present  Left anterior descending (LAD): The LAD is normal in caliber. The proximal vessel was stented with no significant in-stent restenosis. There is minor nonobstructive plaque in the mid vessel. The LAD reaches the LV. The diagonal branches are patent.  Left circumflex (LCx): The left circumflex is normal in caliber. The first OM is small. The second OM is medium in caliber. There is no obstructive disease present.  Right coronary artery (RCA): There's mild diffuse calcification present. The RCA is dominant. The stent in the proximal portion is widely patent without significant in-stent restenosis. The PDA and PLA branches are patent without significant disease.  Left ventriculography: Left ventricular systolic function is normal, LVEF is estimated at 55-65%, there is no  significant mitral regurgitation   Final Conclusions:   1. Widely patent stents in the LAD and right coronary arteries. 2. Preserved LV systolic function. 3. Elevated LVEDP  Recommendations: Continue medical therapy with antihypertensive treatment. I don't see any indication to resume Plavix as the patient is greater than 12 months out from stenting with widely patent stents.  [name] 09/19/2013, 6:18 PM

## 2013-09-19 NOTE — Progress Notes (Addendum)
Subjective: No further CP  Objective: Vital signs in last 24 hours: Temp:  [97.7 F (36.5 C)-98.4 F (36.9 C)] 97.9 F (36.6 C) (05/26 0353) Pulse Rate:  [48-58] 55 (05/26 0353) Resp:  [13-21] 20 (05/26 0353) BP: (137-189)/(34-74) 146/49 mmHg (05/26 0353) SpO2:  [95 %-98 %] 95 % (05/26 0353) Weight:  [225 lb (102.059 kg)-232 lb 8 oz (105.461 kg)] 232 lb 8 oz (105.461 kg) (05/26 0135)    Intake/Output from previous day: 05/25 0701 - 05/26 0700 In: -  Out: 600 [Urine:600] Intake/Output this shift:    Medications Current Facility-Administered Medications  Medication Dose Route Frequency Provider Last Rate Last Dose  . 0.9 %  sodium chloride infusion  250 mL Intravenous PRN Dolores Patty, MD      . 0.9 %  sodium chloride infusion   Intravenous Continuous Dolores Patty, MD 75 mL/hr at 09/19/13 3833    . albuterol (PROVENTIL) (2.5 MG/3ML) 0.083% nebulizer solution 2.5 mg  2.5 mg Nebulization Q4H PRN Dolores Patty, MD      . amLODipine (NORVASC) tablet 5 mg  5 mg Oral Daily Dolores Patty, MD      . aspirin chewable tablet 324 mg  324 mg Oral NOW Dolores Patty, MD       Or  . aspirin suppository 300 mg  300 mg Rectal NOW Dolores Patty, MD      . aspirin chewable tablet 81 mg  81 mg Oral Daily Dolores Patty, MD      . Melene Muller ON 09/20/2013] aspirin chewable tablet 81 mg  81 mg Oral Pre-Cath Dolores Patty, MD      . Melene Muller ON 09/20/2013] aspirin EC tablet 81 mg  81 mg Oral Daily Dolores Patty, MD      . atenolol (TENORMIN) tablet 100 mg  100 mg Oral Daily Dolores Patty, MD      . buPROPion [facility] SR) 12 hr tablet 150 mg  150 mg Oral Daily Dolores Patty, MD      . Melene Muller ON 09/20/2013] Canagliflozin TABS 100 mg  100 mg Oral Daily Dolores Patty, MD      . clopidogrel (PLAVIX) tablet 75 mg  75 mg Oral Q breakfast Dolores Patty, MD      . heparin ADULT infusion 100 units/mL (25000 units/250 mL)  1,250 Units/hr  Intravenous Continuous Vida Roller, MD 12.5 mL/hr at 09/19/13 0102 1,250 Units/hr at 09/19/13 0102  . heparin bolus via infusion 4,000 Units  4,000 Units Intravenous Once Dolores Patty, MD       And  . heparin ADULT infusion 100 units/mL (25000 units/250 mL)  1,200 Units/hr Intravenous Continuous Dolores Patty, MD 12 mL/hr at 09/19/13 0146 1,200 Units/hr at 09/19/13 0146  . hydrochlorothiazide (HYDRODIURIL) tablet 25 mg  25 mg Oral Daily Bevelyn Buckles Bensimhon, MD      . insulin aspart (novoLOG) injection 0-15 Units  0-15 Units Subcutaneous TID WC Dolores Patty, MD      . insulin aspart (novoLOG) injection 0-5 Units  0-5 Units Subcutaneous QHS Dolores Patty, MD      . losartan (COZAAR) tablet 100 mg  100 mg Oral Daily Bevelyn Buckles Bensimhon, MD      . nitroGLYCERIN (NITROSTAT) SL tablet 0.4 mg  0.4 mg Sublingual Q5 min PRN Dolores Patty, MD      . nitroGLYCERIN (NITROSTAT) SL tablet 0.4 mg  0.4 mg Sublingual Q5 Min  x 3 PRN Dolores Pattyaniel R Bensimhon, MD      . sertraline (ZOLOFT) tablet 50 mg  50 mg Oral Daily Bevelyn Bucklesaniel R Bensimhon, MD      . sodium chloride 0.9 % injection 3 mL  3 mL Intravenous Q12H Bevelyn Bucklesaniel R Bensimhon, MD      . sodium chloride 0.9 % injection 3 mL  3 mL Intravenous PRN Dolores Pattyaniel R Bensimhon, MD        PE: General appearance: alert, cooperative and no distress Lungs: clear to auscultation bilaterally Heart: regular rate and rhythm, S1, S2 normal, no murmur, click, rub or gallop Extremities: No LEE Pulses: 2+ and symmetric Skin: Warm and dry Neurologic: Grossly normal  Lab Results:   Recent Labs  09/18/13 2255  WBC 11.0*  HGB 13.9  HCT 41.7  PLT 233   BMET  Recent Labs  09/18/13 2255  NA 139  K 4.1  CL 99  CO2 27  GLUCOSE 155*  BUN 14  CREATININE 0.76  CALCIUM 9.6    Cardiac Panel (last 3 results)  Recent Labs  09/19/13 0226  TROPONINI <0.30    Assessment/Plan 58 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   Last cath 8/13:  Left mainstem: Normal  Left anterior descending (LAD): There is a 70-80% fusiform lesion in the proximal LAD with moderate calcification.  Left circumflex (LCx): There are minor irregularities less than 10%.  Right coronary artery (RCA): The right coronary is a large dominant vessel. The stent in the proximal vessel is widely patent without significant disease.  LAD stented with 3.5x 18 Xience DES  Says she has done well since LAD stented. Stopped Plavix for colonoscopy in 12/14 and didn't restart. Over past 2-3 days progressive episodes of CP radiating to jaw with rest and exertion. Tonight they got more frequent so came to ER. Reminiscent of previous angina. ECG and CE x 1 are ok. Now pain free.   Active Problems: 1. [place]  2. CAD  -- s/p DES to RCA 1/13  -- s/p DES to LAD 8/13 (Xience 3.5x18 Xience)  3. COPD  4. DM2  5. Obesity  Plan:   No further CP. Left heart cath today.  Heparin, plavix, asa, BB.  SS insulin for DM.  Had lap band four years ago.  Weight fluctuates.  Does not seem to be interested in exercise.  Bp mildly elevated.  Continue to monitor.  Consider increasing amlodipine to10mg .   LOS: 1 day    Karina FinlayBryan Hager PA-C 09/19/2013 7:27 AM  As above, patient seen and examined. She is presently pain-free. Enzymes negative. Plan to proceed with cardiac catheterization later today. The risks and benefits were discussed and she agrees to proceed. Hold Glucophage 48 hours following procedure. Continue aspirin, Plavix, heparin and beta blocker. Resume Crestor and increased to 40 mg daily at DC. Change amlodipine to 10 mg daily for improved blood pressure control. Karina Oneal

## 2013-09-19 NOTE — Progress Notes (Signed)
ANTICOAGULATION CONSULT NOTE - Initial Consult  Pharmacy Consult for Heparin Indication: chest pain/ACS  Allergies  Allergen Reactions  . Atorvastatin Other (See Comments)    Muscle weakness.  Takes crestor 3x week without problem  . Ciprofloxacin Nausea And Vomiting  . Lisinopril Cough  . Sulfa Antibiotics Itching   Patient Measurements: Height: 5\' 5"  (165.1 cm) Weight: 225 lb (102.059 kg) IBW/kg (Calculated) : 57 Heparin Dosing Weight: 71 kg  Vital Signs: Temp: 98.4 F (36.9 C) (05/25 2319) Temp src: Oral (05/25 2319) BP: 165/56 mmHg (05/26 0114) Pulse Rate: 49 (05/26 0114)  Labs:  Recent Labs  09/18/13 2255  HGB 13.9  HCT 41.7  PLT 233  CREATININE 0.76   Estimated Creatinine Clearance: 93 ml/min (by C-G formula based on Cr of 0.76).  Medical History: Past Medical History  Diagnosis Date  . Hypertension   . Diverticulitis   . Obesity   . GERD (gastroesophageal reflux disease)   . CAD (coronary artery disease)     a.  PCI 1/13: s/p IVUS-guided DES to RCA;   b. LHC 8/13: pLAD 70-80%, irregs in CFX, pRCA stent patent; FFR abnormal at LAD lesion ==> PCI: Xence Xpedition DES to pLAD ;  c.  EF 66% by nuclear study 1/13 and EF 55-65% by [facility] 1/13  . Dyslipidemia   . Anginal pain   . COPD (chronic obstructive pulmonary disease)   . Shortness of breath   . Pneumonia    Assessment: 58 yo with PMH of CAD, presents with chest pain.  We have been asked to start her IV heparin for ongoing cardiology work up.  Goal of Therapy:  Heparin level 0.3-0.7 units/ml Monitor platelets by anticoagulation protocol: Yes   Plan:  - Heparin 4000 units bolus x 1 - Heparin 1200 units/hr - Heparin level in 8 hours - Daily heparin level/CBC  Nadara Mustard, PharmD., MS Clinical Pharmacist Pager:  [phone] Thank you for allowing pharmacy to be part of this patients care team. 09/19/2013,1:25 AM

## 2013-09-19 NOTE — Interval H&P Note (Signed)
History and Physical Interval Note:  09/19/2013 5:47 PM  Karina Oneal  has presented today for surgery, with the diagnosis of chest pain  The various methods of treatment have been discussed with the patient and family. After consideration of risks, benefits and other options for treatment, the patient has consented to  Procedure(s): LEFT HEART CATHETERIZATION WITH CORONARY ANGIOGRAM (N/A) as a surgical intervention .  The patient's history has been reviewed, patient examined, no change in status, stable for surgery.  I have reviewed the patient's chart and labs.  Questions were answered to the patient's satisfaction.    Cath Lab Visit (complete for each Cath Lab visit)  Clinical Evaluation Leading to the Procedure:   ACS: yes  Non-ACS:    Anginal Classification: CCS IV  Anti-ischemic medical therapy: Maximal Therapy (2 or more classes of medications)  Non-Invasive Test Results: No non-invasive testing performed  Prior CABG: No previous CABG       Karina Oneal

## 2013-10-05 ENCOUNTER — Ambulatory Visit (INDEPENDENT_AMBULATORY_CARE_PROVIDER_SITE_OTHER): Payer: 59 | Admitting: Physician Assistant

## 2013-10-05 ENCOUNTER — Encounter: Payer: Self-pay | Admitting: Physician Assistant

## 2013-10-05 VITALS — BP 132/78 | HR 50 | Ht 65.0 in | Wt 232.0 lb

## 2013-10-05 DIAGNOSIS — E785 Hyperlipidemia, unspecified: Secondary | ICD-10-CM

## 2013-10-05 DIAGNOSIS — I2510 Atherosclerotic heart disease of native coronary artery without angina pectoris: Secondary | ICD-10-CM

## 2013-10-05 DIAGNOSIS — I10 Essential (primary) hypertension: Secondary | ICD-10-CM

## 2013-10-05 DIAGNOSIS — K219 Gastro-esophageal reflux disease without esophagitis: Secondary | ICD-10-CM

## 2013-10-05 NOTE — Progress Notes (Signed)
Cardiology Office Note   Date:  10/05/2013   ID:  Karina Oneal, DOB 1955/05/01, MRN [id number]  PCP:   Duane Lope, MD  Cardiologist:  Dr. Dietrich Pates      History of Present Illness: Karina Oneal is a 58 y.o. female with a hx of CAD, s/p DES to the RCA in 1/13, preserved LV function, HTN, HL, borderline DM2, COPD, obesity s/p lap band surgery (2011). She was admitted 11/2011 with [place]. LHC demonstrated a patent stent in the RCA but a high-grade lesion in the proximal LAD that was hemodynamically significant by FFR. She underwent PCI with placement of a Xience DES to the pLAD.    Admitted 5/25-5/26 with symptoms concerning for [place].  She ruled out for MI.  LHC demonstrated patent stents and Med Rx was recommended.  She is doing well. She notes occasional chest pains. She denies exertional symptoms. She denies any relation to meals.  She denies dyspnea, syncope, orthopnea, PND or edema.   Studies:  - LHC (09/18/13):  Patent stents in the LAD and RCA, no other obstructive disease, EF 55-65%   Recent Labs: 09/18/2013: ALT 36*; Creatinine 0.76; Hemoglobin 13.9; Potassium 4.1  09/19/2013: HDL Cholesterol by NMR 46; LDL (calc) 90   Wt Readings from Last 3 Encounters:  10/05/13 232 lb (105.235 kg)  09/19/13 232 lb 8 oz (105.461 kg)  09/19/13 232 lb 8 oz (105.461 kg)     Past Medical History  Diagnosis Date  . Hypertension   . Diverticulitis   . Obesity   . CAD (coronary artery disease)     a.  PCI 1/13: s/p IVUS-guided DES to RCA;   b. LHC 8/13: pLAD 70-80%, irregs in CFX, pRCA stent patent; FFR abnormal at LAD lesion ==> PCI: Xence Xpedition DES to pLAD ;  c.  EF 66% by nuclear study 1/13 and EF 55-65% by [facility] 1/13  . Dyslipidemia   . Anginal pain   . COPD (chronic obstructive pulmonary disease)   . Shortness of breath   . Heart murmur   . CHF (congestive heart failure)   . Pneumonia     "couple times"   . Type II diabetes mellitus     Current Outpatient Prescriptions  Medication  Sig Dispense Refill  . albuterol (PROVENTIL HFA;VENTOLIN HFA) 108 (90 BASE) MCG/ACT inhaler Inhale 2 puffs into the lungs every 6 (six) hours as needed for wheezing.      Marland Kitchen amLODipine (NORVASC) 10 MG tablet Take 1 tablet (10 mg total) by mouth daily.  30 tablet  6  . aspirin 81 MG EC tablet Take 81 mg by mouth daily.        Marland Kitchen atenolol (TENORMIN) 100 MG tablet Take 100 mg by mouth daily.        Marland Kitchen atorvastatin (LIPITOR) 10 MG tablet Take 10 mg by mouth daily.      Marland Kitchen buPROPion (WELLBUTRIN SR) 150 MG 12 hr tablet Take 150 mg by mouth daily.      . Canagliflozin (INVOKANA) 100 MG TABS Take 100 mg by mouth every morning.      . Coenzyme Q10 (CO Q 10 PO) Take 1 tablet by mouth daily.      . hydrochlorothiazide 25 MG tablet Take 25 mg by mouth daily.        Marland Kitchen losartan (COZAAR) 100 MG tablet Take 100 mg by mouth daily.      . metFORMIN (GLUCOPHAGE-XR) 500 MG 24 hr tablet Take 2 tablets (1,000 mg  total) by mouth at bedtime.  60 tablet  6  . nitroGLYCERIN (NITROSTAT) 0.4 MG SL tablet Place 0.4 mg under the tongue every 5 (five) minutes as needed. For chest pain      . sertraline (ZOLOFT) 50 MG tablet Take 50 mg by mouth daily.       No current facility-administered medications for this visit.    Allergies:   Ciprofloxacin; Lisinopril; and Sulfa antibiotics   Social History:  The patient  reports that she quit smoking about a year ago. Her smoking use included Cigarettes. She has a 42 pack-year smoking history. She has never used smokeless tobacco. She reports that she drinks alcohol. She reports that she uses illicit drugs (Marijuana).   Family History:  The patient's family history includes Asthma in her father; Coronary artery disease (age of onset: 10260) in her mother; Emphysema (age of onset: 4957) in her father.   ROS:  Please see the history of present illness.      All other systems reviewed and negative.   PHYSICAL EXAM: VS:  BP 132/78  Pulse 50  Ht 5\' 5"  (1.651 m)  Wt 232 lb (105.235 kg)   BMI 38.61 kg/m2 Well nourished, well developed, in no acute distress HEENT: normal Neck: no JVD Cardiac:  normal S1, S2; RRR; no murmur Lungs:  clear to auscultation bilaterally, no wheezing, rhonchi or rales Abd: soft, nontender, no hepatomegaly Ext: no edemaright wrist without hematoma or mass  Skin: warm and dry Neuro:  CNs 2-12 intact, no focal abnormalities noted  EKG:  Sinus bradycardia, HR 50, normal axis, no ST changes     ASSESSMENT AND PLAN:  1. CAD (coronary artery disease): As noted, recent cardiac catheterization demonstrated a patent stent in the RCA and LAD. Continue medical therapy. Continue aspirin, beta blocker, statin. 2. Hypertension:  Controlled.. 3. GERD (gastroesophageal reflux disease):  Question if this is playing a role in her chest symptoms. I have suggested that she try over-the-counter Pepcid to see if this helps. 4. Dyslipidemia: Continue statin. 5. Disposition: Followup with Dr. Tenny Crawoss in 6 months.   Signed, Brynda RimScott Reno Clasby, PA-C, MHS 10/05/2013 1:25 PM    [facility] [address], [place], [place]  [address] Phone: [phone]; Fax: [phone]

## 2013-10-05 NOTE — Patient Instructions (Signed)
TRY OTC PEPCID TWICE DAILY FOR A FEW WEEKS TO SEE IF THIS HELPS YOUR SYMPTOMS  Your physician wants you to follow-up in: 6 MONTHS WITH DR. ROSS You will receive a reminder letter in the mail two months in advance. If you don't receive a letter, please call our office to schedule the follow-up appointment.

## 2013-12-20 HISTORY — PX: CARDIAC CATHETERIZATION: SHX172

## 2014-02-01 ENCOUNTER — Encounter: Payer: Self-pay | Admitting: Internal Medicine

## 2014-02-28 ENCOUNTER — Encounter: Payer: Self-pay | Admitting: Internal Medicine

## 2014-04-05 ENCOUNTER — Encounter (HOSPITAL_COMMUNITY): Payer: Self-pay | Admitting: Cardiology

## 2014-06-06 ENCOUNTER — Encounter (HOSPITAL_COMMUNITY): Payer: Self-pay | Admitting: Emergency Medicine

## 2014-06-06 ENCOUNTER — Emergency Department (HOSPITAL_COMMUNITY)
Admission: EM | Admit: 2014-06-06 | Discharge: 2014-06-06 | Disposition: A | Discharge status: Home / Self Care | Payer: 59 | Attending: Emergency Medicine | Admitting: Emergency Medicine

## 2014-06-06 DIAGNOSIS — Z87891 Personal history of nicotine dependence: Secondary | ICD-10-CM | POA: Insufficient documentation

## 2014-06-06 DIAGNOSIS — J441 Chronic obstructive pulmonary disease with (acute) exacerbation: Secondary | ICD-10-CM | POA: Diagnosis not present

## 2014-06-06 DIAGNOSIS — R109 Unspecified abdominal pain: Secondary | ICD-10-CM | POA: Diagnosis present

## 2014-06-06 DIAGNOSIS — I10 Essential (primary) hypertension: Secondary | ICD-10-CM | POA: Insufficient documentation

## 2014-06-06 DIAGNOSIS — Z8719 Personal history of other diseases of the digestive system: Secondary | ICD-10-CM | POA: Diagnosis not present

## 2014-06-06 DIAGNOSIS — E669 Obesity, unspecified: Secondary | ICD-10-CM | POA: Insufficient documentation

## 2014-06-06 DIAGNOSIS — E119 Type 2 diabetes mellitus without complications: Secondary | ICD-10-CM | POA: Diagnosis not present

## 2014-06-06 DIAGNOSIS — Z79899 Other long term (current) drug therapy: Secondary | ICD-10-CM | POA: Diagnosis not present

## 2014-06-06 DIAGNOSIS — I509 Heart failure, unspecified: Secondary | ICD-10-CM | POA: Diagnosis not present

## 2014-06-06 DIAGNOSIS — I25119 Atherosclerotic heart disease of native coronary artery with unspecified angina pectoris: Secondary | ICD-10-CM | POA: Diagnosis not present

## 2014-06-06 DIAGNOSIS — R1032 Left lower quadrant pain: Secondary | ICD-10-CM | POA: Insufficient documentation

## 2014-06-06 DIAGNOSIS — Z9861 Coronary angioplasty status: Secondary | ICD-10-CM | POA: Insufficient documentation

## 2014-06-06 DIAGNOSIS — E785 Hyperlipidemia, unspecified: Secondary | ICD-10-CM | POA: Diagnosis not present

## 2014-06-06 DIAGNOSIS — Z7982 Long term (current) use of aspirin: Secondary | ICD-10-CM | POA: Diagnosis not present

## 2014-06-06 DIAGNOSIS — Z8701 Personal history of pneumonia (recurrent): Secondary | ICD-10-CM | POA: Insufficient documentation

## 2014-06-06 DIAGNOSIS — R011 Cardiac murmur, unspecified: Secondary | ICD-10-CM | POA: Insufficient documentation

## 2014-06-06 LAB — CBC WITH DIFFERENTIAL/PLATELET
Basophils Absolute: 0.1 10*3/uL (ref 0.0–0.1)
Basophils Relative: 0 % (ref 0–1)
Eosinophils Absolute: 0.3 10*3/uL (ref 0.0–0.7)
Eosinophils Relative: 2 % (ref 0–5)
HCT: 39.2 % (ref 36.0–46.0)
Hemoglobin: 13.2 g/dL (ref 12.0–15.0)
LYMPHS PCT: 19 % (ref 12–46)
Lymphs Abs: 2.8 10*3/uL (ref 0.7–4.0)
MCH: 30 pg (ref 26.0–34.0)
MCHC: 33.7 g/dL (ref 30.0–36.0)
MCV: 89.1 fL (ref 78.0–100.0)
Monocytes Absolute: 1 10*3/uL (ref 0.1–1.0)
Monocytes Relative: 7 % (ref 3–12)
NEUTROS ABS: 10.9 10*3/uL — AB (ref 1.7–7.7)
NEUTROS PCT: 72 % (ref 43–77)
Platelets: 208 10*3/uL (ref 150–400)
RBC: 4.4 MIL/uL (ref 3.87–5.11)
RDW: 13.1 % (ref 11.5–15.5)
WBC: 15.1 10*3/uL — AB (ref 4.0–10.5)

## 2014-06-06 LAB — COMPREHENSIVE METABOLIC PANEL
ALBUMIN: 3.7 g/dL (ref 3.5–5.2)
ALK PHOS: 99 U/L (ref 39–117)
ALT: 21 U/L (ref 0–35)
AST: 19 U/L (ref 0–37)
Anion gap: 7 (ref 5–15)
BILIRUBIN TOTAL: 0.4 mg/dL (ref 0.3–1.2)
BUN: 11 mg/dL (ref 6–23)
CHLORIDE: 102 mmol/L (ref 96–112)
CO2: 29 mmol/L (ref 19–32)
Calcium: 9.1 mg/dL (ref 8.4–10.5)
Creatinine, Ser: 0.65 mg/dL (ref 0.50–1.10)
GFR calc Af Amer: >90 mL/min (ref >90)
GFR calc non Af Amer: >90 mL/min (ref >90)
Glucose, Bld: 178 mg/dL — ABNORMAL HIGH (ref 70–99)
Potassium: 3.7 mmol/L (ref 3.5–5.1)
Sodium: 138 mmol/L (ref 135–145)
Total Protein: 6.2 g/dL (ref 6.0–8.3)

## 2014-06-06 MED ORDER — HYDROCODONE-ACETAMINOPHEN 5-325 MG PO TABS: 1.0000 | ORAL_TABLET | Freq: Four times a day (QID) | ORAL | Status: DC | PRN

## 2014-06-06 MED ORDER — AMOXICILLIN-POT CLAVULANATE 875-125 MG PO TABS
1.0000 | ORAL_TABLET | Freq: Once | ORAL | Status: AC
  Administered 2014-06-06: 1 via ORAL
  Filled 2014-06-06: qty 1

## 2014-06-06 MED ORDER — HYDROCODONE-ACETAMINOPHEN 5-325 MG PO TABS
2.0000 | ORAL_TABLET | Freq: Once | ORAL | Status: AC
  Administered 2014-06-06: 2 via ORAL
  Filled 2014-06-06: qty 2

## 2014-06-06 MED ORDER — AMOXICILLIN-POT CLAVULANATE 500-125 MG PO TABS: 1.0000 | ORAL_TABLET | Freq: Three times a day (TID) | ORAL | Status: DC
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
## 2014-06-06 NOTE — ED Notes (Signed)
Pt. reports LLQ pain onset this evening , denies nausea or vomitting , no fever or diarrhea , states history of diverticulitis.

## 2014-06-06 NOTE — Discharge Instructions (Signed)
Augmentin as prescribed.  Hydrocodone as prescribed as needed for pain.  Return to the emergency department if you develop worsening pain, high fever, vomiting, bloody stool, or other new and concerning symptoms.   Abdominal Pain Many things can cause abdominal pain. Usually, abdominal pain is not caused by a disease and will improve without treatment. It can often be observed and treated at home. Your health care provider will do a physical exam and possibly order blood tests and X-rays to help determine the seriousness of your pain. However, in many cases, more time must pass before a clear cause of the pain can be found. Before that point, your health care provider may not know if you need more testing or further treatment. HOME CARE INSTRUCTIONS  Monitor your abdominal pain for any changes. The following actions may help to alleviate any discomfort you are experiencing:  Only take over-the-counter or prescription medicines as directed by your health care provider.  Do not take laxatives unless directed to do so by your health care provider.  Try a clear liquid diet (broth, tea, or water) as directed by your health care provider. Slowly move to a bland diet as tolerated. SEEK MEDICAL CARE IF:  You have unexplained abdominal pain.  You have abdominal pain associated with nausea or diarrhea.  You have pain when you urinate or have a bowel movement.  You experience abdominal pain that wakes you in the night.  You have abdominal pain that is worsened or improved by eating food.  You have abdominal pain that is worsened with eating fatty foods.  You have a fever. SEEK IMMEDIATE MEDICAL CARE IF:   Your pain does not go away within 2 hours.  You keep throwing up (vomiting).  Your pain is felt only in portions of the abdomen, such as the right side or the left lower portion of the abdomen.  You pass bloody or black tarry stools. MAKE SURE YOU:  Understand these instructions.    Will watch your condition.   Will get help right away if you are not doing well or get worse.  Document Released: 01/21/2005 Document Revised: 04/18/2013 Document Reviewed: 12/21/2012 [facility] Patient Information 2015 [employer] [place]. This information is not intended to replace advice given to you by your health care provider. Make sure you discuss any questions you have with your health care provider.

## 2014-09-01 ENCOUNTER — Emergency Department (HOSPITAL_COMMUNITY): Payer: 59

## 2014-09-01 ENCOUNTER — Emergency Department (HOSPITAL_COMMUNITY)
Admission: EM | Admit: 2014-09-01 | Discharge: 2014-09-01 | Disposition: A | Discharge status: Home / Self Care | Payer: 59 | Attending: Emergency Medicine | Admitting: Emergency Medicine

## 2014-09-01 ENCOUNTER — Encounter (HOSPITAL_COMMUNITY): Payer: Self-pay

## 2014-09-01 DIAGNOSIS — I509 Heart failure, unspecified: Secondary | ICD-10-CM | POA: Diagnosis not present

## 2014-09-01 DIAGNOSIS — J449 Chronic obstructive pulmonary disease, unspecified: Secondary | ICD-10-CM | POA: Diagnosis not present

## 2014-09-01 DIAGNOSIS — R0789 Other chest pain: Secondary | ICD-10-CM | POA: Insufficient documentation

## 2014-09-01 DIAGNOSIS — L559 Sunburn, unspecified: Secondary | ICD-10-CM | POA: Insufficient documentation

## 2014-09-01 DIAGNOSIS — I2510 Atherosclerotic heart disease of native coronary artery without angina pectoris: Secondary | ICD-10-CM | POA: Insufficient documentation

## 2014-09-01 DIAGNOSIS — Z87891 Personal history of nicotine dependence: Secondary | ICD-10-CM | POA: Diagnosis not present

## 2014-09-01 DIAGNOSIS — E119 Type 2 diabetes mellitus without complications: Secondary | ICD-10-CM | POA: Insufficient documentation

## 2014-09-01 DIAGNOSIS — Z9889 Other specified postprocedural states: Secondary | ICD-10-CM | POA: Diagnosis not present

## 2014-09-01 DIAGNOSIS — E669 Obesity, unspecified: Secondary | ICD-10-CM | POA: Insufficient documentation

## 2014-09-01 DIAGNOSIS — Z79899 Other long term (current) drug therapy: Secondary | ICD-10-CM | POA: Insufficient documentation

## 2014-09-01 DIAGNOSIS — M79602 Pain in left arm: Secondary | ICD-10-CM | POA: Diagnosis not present

## 2014-09-01 DIAGNOSIS — E785 Hyperlipidemia, unspecified: Secondary | ICD-10-CM | POA: Insufficient documentation

## 2014-09-01 DIAGNOSIS — Z8719 Personal history of other diseases of the digestive system: Secondary | ICD-10-CM | POA: Insufficient documentation

## 2014-09-01 DIAGNOSIS — Z8701 Personal history of pneumonia (recurrent): Secondary | ICD-10-CM | POA: Diagnosis not present

## 2014-09-01 DIAGNOSIS — Z9861 Coronary angioplasty status: Secondary | ICD-10-CM | POA: Insufficient documentation

## 2014-09-01 DIAGNOSIS — M79605 Pain in left leg: Secondary | ICD-10-CM

## 2014-09-01 DIAGNOSIS — I10 Essential (primary) hypertension: Secondary | ICD-10-CM | POA: Diagnosis not present

## 2014-09-01 DIAGNOSIS — Z7982 Long term (current) use of aspirin: Secondary | ICD-10-CM | POA: Diagnosis not present

## 2014-09-01 DIAGNOSIS — R079 Chest pain, unspecified: Secondary | ICD-10-CM | POA: Diagnosis present

## 2014-09-01 DIAGNOSIS — R011 Cardiac murmur, unspecified: Secondary | ICD-10-CM | POA: Insufficient documentation

## 2014-09-01 LAB — BASIC METABOLIC PANEL
Anion gap: 11 (ref 5–15)
BUN: 9 mg/dL (ref 6–20)
CO2: 26 mmol/L (ref 22–32)
Calcium: 9 mg/dL (ref 8.9–10.3)
Chloride: 100 mmol/L — ABNORMAL LOW (ref 101–111)
Creatinine, Ser: 0.66 mg/dL (ref 0.44–1.00)
GFR calc Af Amer: >60 mL/min (ref >60)
GFR calc non Af Amer: >60 mL/min (ref >60)
Glucose, Bld: 186 mg/dL — ABNORMAL HIGH (ref 70–99)
Potassium: 3.7 mmol/L (ref 3.5–5.1)
SODIUM: 137 mmol/L (ref 135–145)

## 2014-09-01 LAB — CBC
HEMATOCRIT: 40.4 % (ref 36.0–46.0)
Hemoglobin: 13.4 g/dL (ref 12.0–15.0)
MCH: 30 pg (ref 26.0–34.0)
MCHC: 33.2 g/dL (ref 30.0–36.0)
MCV: 90.6 fL (ref 78.0–100.0)
Platelets: 224 10*3/uL (ref 150–400)
RBC: 4.46 MIL/uL (ref 3.87–5.11)
RDW: 13.6 % (ref 11.5–15.5)
WBC: 10.1 10*3/uL (ref 4.0–10.5)

## 2014-09-01 LAB — I-STAT TROPONIN, ED
TROPONIN I, POC: 0.07 ng/mL (ref 0.00–0.08)
Troponin i, poc: 0 ng/mL (ref 0.00–0.08)

## 2014-09-01 MED ORDER — FENTANYL CITRATE (PF) 100 MCG/2ML IJ SOLN
50.0000 ug | Freq: Once | INTRAMUSCULAR | Status: AC
  Administered 2014-09-01: 50 ug via INTRAVENOUS
  Filled 2014-09-01: qty 2

## 2014-09-01 MED ORDER — ACETAMINOPHEN 325 MG PO TABS: 650.0000 mg | ORAL_TABLET | Freq: Once | ORAL | Status: DC

## 2014-09-01 NOTE — Discharge Instructions (Signed)
Your evaluated in the ED today for your chest pain. There is not appear to be an emergent cause for your symptoms at this time. You will be contacted to [facility], it is important for you to keep this appointment. Please follow-up with your primary care. Return to ED for new or worsening symptoms.  Chest Pain (Nonspecific) It is often hard to give a specific diagnosis for the cause of chest pain. There is always a chance that your pain could be related to something serious, such as a heart attack or a blood clot in the lungs. You need to follow up with your health care provider for further evaluation. CAUSES   Heartburn.  Pneumonia or bronchitis.  Anxiety or stress.  Inflammation around your heart (pericarditis) or lung (pleuritis or pleurisy).  A blood clot in the lung.  A collapsed lung (pneumothorax). It can develop suddenly on its own (spontaneous pneumothorax) or from trauma to the chest.  Shingles infection (herpes zoster virus). The chest wall is composed of bones, muscles, and cartilage. Any of these can be the source of the pain.  The bones can be bruised by injury.  The muscles or cartilage can be strained by coughing or overwork.  The cartilage can be affected by inflammation and become sore (costochondritis). DIAGNOSIS  Lab tests or other studies may be needed to find the cause of your pain. Your health care provider may have you take a test called an ambulatory electrocardiogram (ECG). An ECG records your heartbeat patterns over a 24-hour period. You may also have other tests, such as:  Transthoracic echocardiogram (TTE). During echocardiography, sound waves are used to evaluate how blood flows through your heart.  Transesophageal echocardiogram (TEE).  Cardiac monitoring. This allows your health care provider to monitor your heart rate and rhythm in real time.  Holter monitor. This is a portable device that records  your heartbeat and can help diagnose heart arrhythmias. It allows your health care provider to track your heart activity for several days, if needed.  Stress tests by exercise or by giving medicine that makes the heart beat faster. TREATMENT   Treatment depends on what may be causing your chest pain. Treatment may include:  Acid blockers for heartburn.  Anti-inflammatory medicine.  Pain medicine for inflammatory conditions.  Antibiotics if an infection is present.  You may be advised to change lifestyle habits. This includes stopping smoking and avoiding alcohol, caffeine, and chocolate.  You may be advised to keep your head raised (elevated) when sleeping. This reduces the chance of acid going backward from your stomach into your esophagus. Most of the time, nonspecific chest pain will improve within 2-3 days with rest and mild pain medicine.  HOME CARE INSTRUCTIONS   If antibiotics were prescribed, take them as directed. Finish them even if you start to feel better.  For the next few days, avoid physical activities that bring on chest pain. Continue physical activities as directed.  Do not use any tobacco products, including cigarettes, chewing tobacco, or electronic cigarettes.  Avoid drinking alcohol.  Only take medicine as directed by your health care provider.  Follow your health care provider's suggestions for further testing if your chest pain does not go away.  Keep any follow-up appointments you made. If you do not go to an appointment, you could develop lasting (chronic) problems with pain. If there is any problem keeping an appointment, call to reschedule. SEEK MEDICAL CARE IF:   Your  chest pain does not go away, even after treatment.  You have a rash with blisters on your chest.  You have a fever. SEEK IMMEDIATE MEDICAL CARE IF:   You have increased chest pain or pain that spreads to your arm, neck, jaw, back, or abdomen.  You have shortness of breath.  You  have an increasing cough, or you cough up blood.  You have severe back or abdominal pain.  You feel nauseous or vomit.  You have severe weakness.  You faint.  You have chills. This is an emergency. Do not wait to see if the pain will go away. Get medical help at once. Call your local emergency services (911 in [place] Do not drive yourself to the hospital. MAKE SURE YOU:   Understand these instructions.  Will watch your condition.  Will get help right away if you are not doing well or get worse. Document Released: 01/21/2005 Document Revised: 04/18/2013 Document Reviewed: 11/17/2007 [facility] Patient Information 2015 [place], [place]. This information is not intended to replace advice given to you by your health care provider. Make sure you discuss any questions you have with your health care provider.

## 2014-09-01 NOTE — ED Notes (Signed)
Dr Pickering at bedside 

## 2014-09-01 NOTE — ED Notes (Signed)
Patient transported to X-ray 

## 2014-09-01 NOTE — ED Notes (Signed)
Phlebotomy at bedside.
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
## 2014-09-01 NOTE — ED Notes (Signed)
Pt. Is from home. Complaint of L sided CP and L arm pain starting at 3 am. Pt. States she has had this pain recurrently and has been treated before for it. Pt. AxO 4. Denies any other associated symptoms.
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
## 2014-09-20 ENCOUNTER — Telehealth: Payer: Self-pay | Admitting: *Deleted

## 2014-09-20 ENCOUNTER — Other Ambulatory Visit: Payer: Self-pay | Admitting: *Deleted

## 2014-09-20 DIAGNOSIS — R079 Chest pain, unspecified: Secondary | ICD-10-CM

## 2014-09-20 DIAGNOSIS — I25119 Atherosclerotic heart disease of native coronary artery with unspecified angina pectoris: Secondary | ICD-10-CM

## 2014-09-20 NOTE — Telephone Encounter (Signed)
Left message for pt to call back to discuss scheduling outpt Lexiscan as ordered by Dr Caro HightSkain after seeing her in the ED for chest pain.

## 2014-09-25 ENCOUNTER — Ambulatory Visit (HOSPITAL_COMMUNITY): Payer: 59 | Attending: Cardiovascular Disease

## 2014-09-25 DIAGNOSIS — R079 Chest pain, unspecified: Secondary | ICD-10-CM

## 2014-09-25 DIAGNOSIS — I25119 Atherosclerotic heart disease of native coronary artery with unspecified angina pectoris: Secondary | ICD-10-CM

## 2014-09-25 MED ORDER — TECHNETIUM TC 99M SESTAMIBI GENERIC - CARDIOLITE
33.0000 | Freq: Once | INTRAVENOUS | Status: AC | PRN
  Administered 2014-09-25: 33 via INTRAVENOUS

## 2014-09-25 MED ORDER — REGADENOSON 0.4 MG/5ML IV SOLN
0.4000 mg | Freq: Once | INTRAVENOUS | Status: AC
  Administered 2014-09-25: 0.4 mg via INTRAVENOUS

## 2014-09-25 NOTE — Telephone Encounter (Signed)
Scheduled for 5-31

## 2014-09-28 ENCOUNTER — Ambulatory Visit (HOSPITAL_COMMUNITY): Payer: 59 | Attending: Internal Medicine

## 2014-09-28 LAB — MYOCARDIAL PERFUSION IMAGING
CHL CUP STRESS STAGE 1 DBP: 88 mmHg
CHL CUP STRESS STAGE 1 SBP: 138 mmHg
CHL CUP STRESS STAGE 1 SPEED: 0 mph
CHL CUP STRESS STAGE 2 HR: 53 {beats}/min
CHL CUP STRESS STAGE 3 GRADE: 0 %
CHL CUP STRESS STAGE 3 SBP: 131 mmHg
CHL CUP STRESS STAGE 3 SPEED: 0 mph
CHL CUP STRESS STAGE 4 GRADE: 0 %
CHL CUP STRESS STAGE 4 HR: 78 {beats}/min
CHL CUP STRESS STAGE 5 DBP: 75 mmHg
CHL CUP STRESS STAGE 6 GRADE: 0 %
CHL CUP STRESS STAGE 6 SBP: 126 mmHg
CHL CUP STRESS STAGE 6 SPEED: 0 mph
CSEPPHR: 78 {beats}/min
Estimated workload: 1 METS
LV sys vol: 40 mL
LVDIAVOL: 110 mL
Nuc Stress EF: 63 %
Percent of predicted max HR: 47 %
RATE: 0.29
Rest HR: 55 {beats}/min
SDS: 2
SRS: 2
SSS: 4
Stage 1 Grade: 0 %
Stage 1 HR: 53 {beats}/min
Stage 2 Grade: 0 %
Stage 2 Speed: 0 mph
Stage 3 DBP: 84 mmHg
Stage 3 HR: 64 {beats}/min
Stage 4 Speed: 0 mph
Stage 5 Grade: 0 %
Stage 5 HR: 75 {beats}/min
Stage 5 SBP: 143 mmHg
Stage 5 Speed: 0 mph
Stage 6 DBP: 61 mmHg
Stage 6 HR: 63 {beats}/min
TID: 1.08

## 2014-09-28 MED ORDER — TECHNETIUM TC 99M SESTAMIBI GENERIC - CARDIOLITE
33.0000 | Freq: Once | INTRAVENOUS | Status: AC | PRN
  Administered 2014-09-28: 33 via INTRAVENOUS

## 2014-10-11 ENCOUNTER — Encounter: Payer: Self-pay | Admitting: *Deleted

## 2015-05-14 MED FILL — traZODone HCL 100 MG TABS: 100 | 90 days supply | Qty: 90 | Fill #1

## 2015-05-14 MED FILL — SERTRALINE HCL 100 MG TAB: 100 | 90 days supply | Qty: 90 | Fill #1

## 2015-05-14 MED FILL — METFORMIN HCL ER 500 MG TAB: 500 | 90 days supply | Qty: 180 | Fill #1

## 2015-05-14 MED FILL — HYDROCHLOROTHIAZIDE 25 MG T: 25 | 90 days supply | Qty: 90 | Fill #1

## 2015-05-14 MED FILL — GLIMEPIRIDE 2 MG TABLET: 2 | 90 days supply | Qty: 90 | Fill #1

## 2015-06-17 MED FILL — BUPROPION HCL XL 150 MG TAB: 150 | 90 days supply | Qty: 90 | Fill #3

## 2015-06-17 MED FILL — LOSARTAN POTASSIUM 100 MG T: 100 | 90 days supply | Qty: 90 | Fill #3

## 2015-07-15 MED FILL — AMLODIPINE BESYLATE 5 MG TA: 5 | 90 days supply | Qty: 90 | Fill #1

## 2015-07-15 MED FILL — ATENOLOL 100 MG TABLET: 100 | 90 days supply | Qty: 90 | Fill #1

## 2015-07-15 MED FILL — PRAVASTATIN NA 40 MG TAB: 40 | 90 days supply | Qty: 90 | Fill #1

## 2015-08-16 MED FILL — HYDROCHLOROTHIAZIDE 25 MG T: 25 | 90 days supply | Qty: 90 | Fill #2

## 2015-08-16 MED FILL — GLIMEPIRIDE 2 MG TABLET: 2 | 90 days supply | Qty: 90 | Fill #2

## 2015-09-09 DIAGNOSIS — R1032 Left lower quadrant pain: Secondary | ICD-10-CM | POA: Diagnosis not present

## 2015-09-09 DIAGNOSIS — Z7984 Long term (current) use of oral hypoglycemic drugs: Secondary | ICD-10-CM | POA: Diagnosis not present

## 2015-09-09 DIAGNOSIS — E119 Type 2 diabetes mellitus without complications: Secondary | ICD-10-CM | POA: Diagnosis not present

## 2015-09-09 MED FILL — AMOX TR-K CLV 875-125 MG TA: 875-125 | 10 days supply | Qty: 20 | Fill #0

## 2015-09-09 MED FILL — HYDROCODON-APAP 5-325: 5-325 | 5 days supply | Qty: 20 | Fill #0

## 2015-09-10 MED FILL — BUPROPION HCL XL 150 MG TAB: 150 | 90 days supply | Qty: 90 | Fill #4

## 2015-09-10 MED FILL — traZODone HCL 100 MG TABS: 100 | 90 days supply | Qty: 90 | Fill #2

## 2015-09-10 MED FILL — SERTRALINE HCL 100 MG TAB: 100 | 90 days supply | Qty: 90 | Fill #2

## 2015-09-10 MED FILL — LOSARTAN POTASSIUM 100 MG T: 100 | 90 days supply | Qty: 90 | Fill #4

## 2015-09-10 MED FILL — METFORMIN HCL ER 500 MG TAB: 500 | 90 days supply | Qty: 180 | Fill #2

## 2015-09-13 DIAGNOSIS — K5792 Diverticulitis of intestine, part unspecified, without perforation or abscess without bleeding: Secondary | ICD-10-CM | POA: Diagnosis not present

## 2015-10-13 IMAGING — NM NM MYOCAR MULTI W/ SPECT
3 series · 18 of 18 positions shown · non-contrast
Comparison: none

[Series 1: stress · 6.51mm/px · 6 of 64 frames shown (1 of 2)]
[frame 6/64]
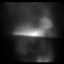
[frame 16/64]
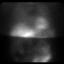
[frame 27/64]
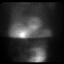
[frame 38/64]
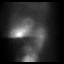
[frame 48/64]
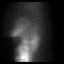
[frame 59/64]
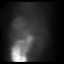

[Series 1: stress · 6.51mm/px · 6 of 509 frames shown (2 of 2)]
[frame 43/509]
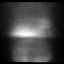
[frame 127/509]
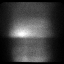
[frame 212/509]
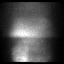
[frame 297/509]
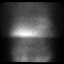
[frame 382/509]
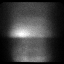
[frame 467/509]
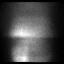

[Series 2: rest · 6.51mm/px · 6 of 64 frames shown]
[frame 6/64]
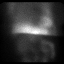
[frame 16/64]
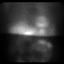
[frame 27/64]
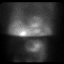
[frame 38/64]
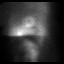
[frame 48/64]
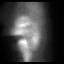
[frame 59/64]
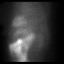

[18 of 18 positions shown; findings below may reference images not displayed]

Canned report from images found in remote index.

Refer to host system for actual result text.

## 2015-10-14 MED FILL — AMLODIPINE BESYLATE 5 MG TA: 5 | 90 days supply | Qty: 90 | Fill #2

## 2015-10-14 MED FILL — ATENOLOL 100 MG TABLET: 100 | 90 days supply | Qty: 90 | Fill #2

## 2015-10-14 MED FILL — PRAVASTATIN NA 40 MG TAB: 40 | 90 days supply | Qty: 90 | Fill #2

## 2015-11-11 MED FILL — HYDROCHLOROTHIAZIDE 25 MG T: 25 | 90 days supply | Qty: 90 | Fill #3

## 2015-11-11 MED FILL — GLIMEPIRIDE 2 MG TABLET: 2 | 90 days supply | Qty: 90 | Fill #3

## 2015-12-11 MED FILL — SERTRALINE HCL 100 MG TAB: 100 | 90 days supply | Qty: 90 | Fill #3

## 2015-12-12 MED FILL — LOSARTAN POTASSIUM 100 MG T: 100 | 90 days supply | Qty: 90 | Fill #0

## 2016-01-01 MED FILL — METFORMIN HCL ER 500 MG TAB: 500 | 90 days supply | Qty: 180 | Fill #3

## 2016-01-01 MED FILL — PRAVASTATIN NA 40 MG TAB: 40 | 90 days supply | Qty: 90 | Fill #3

## 2016-01-01 MED FILL — AMLODIPINE BESYLATE 5 MG TA: 5 | 90 days supply | Qty: 90 | Fill #3

## 2016-01-02 MED FILL — BUPROPION HCL XL 150 MG TAB: 150 | 90 days supply | Qty: 90 | Fill #0

## 2016-01-22 MED FILL — GLIMEPIRIDE 2 MG TABLET: 2 | 90 days supply | Qty: 90 | Fill #4

## 2016-01-22 MED FILL — ATENOLOL 100 MG TABLET: 100 | 90 days supply | Qty: 90 | Fill #3

## 2016-01-22 MED FILL — HYDROCHLOROTHIAZIDE 25 MG T: 25 | 90 days supply | Qty: 90 | Fill #4

## 2016-02-14 ENCOUNTER — Other Ambulatory Visit: Payer: Self-pay | Admitting: Obstetrics & Gynecology

## 2016-02-14 ENCOUNTER — Other Ambulatory Visit (HOSPITAL_COMMUNITY)
Admission: RE | Admit: 2016-02-14 | Discharge: 2016-02-14 | Disposition: A | Discharge status: Home / Self Care | Payer: 59 | Source: Ambulatory Visit | Attending: Obstetrics & Gynecology | Admitting: Obstetrics & Gynecology

## 2016-02-14 DIAGNOSIS — Z01419 Encounter for gynecological examination (general) (routine) without abnormal findings: Secondary | ICD-10-CM | POA: Diagnosis not present

## 2016-02-14 DIAGNOSIS — Z1151 Encounter for screening for human papillomavirus (HPV): Secondary | ICD-10-CM | POA: Diagnosis not present

## 2016-02-14 DIAGNOSIS — Z01411 Encounter for gynecological examination (general) (routine) with abnormal findings: Secondary | ICD-10-CM | POA: Diagnosis not present

## 2016-02-14 DIAGNOSIS — R8290 Unspecified abnormal findings in urine: Secondary | ICD-10-CM | POA: Diagnosis not present

## 2016-02-18 LAB — CYTOLOGY - PAP
Diagnosis: NEGATIVE
HPV (WINDOPATH): NOT DETECTED

## 2016-02-28 ENCOUNTER — Other Ambulatory Visit: Payer: Self-pay | Admitting: Obstetrics and Gynecology

## 2016-02-28 ENCOUNTER — Other Ambulatory Visit: Payer: Self-pay | Admitting: Family Medicine

## 2016-02-28 DIAGNOSIS — Z1231 Encounter for screening mammogram for malignant neoplasm of breast: Secondary | ICD-10-CM

## 2016-03-11 DIAGNOSIS — I10 Essential (primary) hypertension: Secondary | ICD-10-CM | POA: Diagnosis not present

## 2016-03-11 DIAGNOSIS — E119 Type 2 diabetes mellitus without complications: Secondary | ICD-10-CM | POA: Diagnosis not present

## 2016-03-11 DIAGNOSIS — Z0000 Encounter for general adult medical examination without abnormal findings: Secondary | ICD-10-CM | POA: Diagnosis not present

## 2016-03-11 DIAGNOSIS — E7800 Pure hypercholesterolemia, unspecified: Secondary | ICD-10-CM | POA: Diagnosis not present

## 2016-03-11 DIAGNOSIS — B070 Plantar wart: Secondary | ICD-10-CM | POA: Diagnosis not present

## 2016-03-11 DIAGNOSIS — Z7984 Long term (current) use of oral hypoglycemic drugs: Secondary | ICD-10-CM | POA: Diagnosis not present

## 2016-03-11 DIAGNOSIS — F324 Major depressive disorder, single episode, in partial remission: Secondary | ICD-10-CM | POA: Diagnosis not present

## 2016-03-11 MED FILL — traZODone HCL 100 MG TABS: 100 | 90 days supply | Qty: 90 | Fill #0

## 2016-03-11 MED FILL — SERTRALINE HCL 100 MG TAB: 100 | 90 days supply | Qty: 90 | Fill #0

## 2016-03-11 MED FILL — LOSARTAN POTASSIUM 100 MG T: 100 | 90 days supply | Qty: 90 | Fill #0

## 2016-03-17 ENCOUNTER — Ambulatory Visit
Admission: RE | Admit: 2016-03-17 | Discharge: 2016-03-17 | Disposition: A | Discharge status: Home / Self Care | Payer: 59 | Source: Ambulatory Visit | Attending: Family Medicine | Admitting: Family Medicine

## 2016-03-17 DIAGNOSIS — Z1231 Encounter for screening mammogram for malignant neoplasm of breast: Secondary | ICD-10-CM | POA: Diagnosis not present

## 2016-04-09 MED FILL — PRAVASTATIN NA 40 MG TAB: 40 | 90 days supply | Qty: 90 | Fill #0

## 2016-04-09 MED FILL — AMLODIPINE BESYLATE 5 MG TA: 5 | 90 days supply | Qty: 90 | Fill #0

## 2016-04-09 MED FILL — ATENOLOL 100 MG TABLET: 100 | 90 days supply | Qty: 90 | Fill #0

## 2016-04-09 MED FILL — METFORMIN HCL ER 500 MG TAB: 500 | 90 days supply | Qty: 180 | Fill #0

## 2016-04-09 MED FILL — BUPROPION HCL XL 150 MG TAB: 150 | 90 days supply | Qty: 90 | Fill #0

## 2016-05-05 MED FILL — GLIMEPIRIDE 2 MG TABLET: 2 | 90 days supply | Qty: 90 | Fill #0

## 2016-05-05 MED FILL — HYDROCHLOROTHIAZIDE 25 MG T: 25 | 90 days supply | Qty: 90 | Fill #0

## 2016-05-06 DIAGNOSIS — Z0100 Encounter for examination of eyes and vision without abnormal findings: Secondary | ICD-10-CM | POA: Diagnosis not present

## 2016-05-06 DIAGNOSIS — E119 Type 2 diabetes mellitus without complications: Secondary | ICD-10-CM | POA: Diagnosis not present

## 2016-06-02 ENCOUNTER — Encounter (HOSPITAL_COMMUNITY): Payer: Self-pay

## 2016-06-04 MED FILL — LOSARTAN POTASSIUM 100 MG T: 100 | 90 days supply | Qty: 90 | Fill #1

## 2016-06-04 MED FILL — SERTRALINE HCL 100 MG TAB: 100 | 90 days supply | Qty: 90 | Fill #1

## 2016-06-22 DIAGNOSIS — R05 Cough: Secondary | ICD-10-CM | POA: Diagnosis not present

## 2016-06-22 DIAGNOSIS — J209 Acute bronchitis, unspecified: Secondary | ICD-10-CM | POA: Diagnosis not present

## 2016-07-01 MED FILL — BUPROPION HCL XL 150 MG TAB: 150 | 90 days supply | Qty: 90 | Fill #1

## 2016-07-01 MED FILL — ATENOLOL 100 MG TAB: 100 | 90 days supply | Qty: 90 | Fill #1

## 2016-07-01 MED FILL — AMLODIPINE BESYLATE 5 MG TA: 5 | 90 days supply | Qty: 90 | Fill #1

## 2016-07-01 MED FILL — PRAVASTATIN NA 40 MG TAB: 40 | 90 days supply | Qty: 90 | Fill #1

## 2016-07-20 MED FILL — traZODone HCL 100 MG TABS: 100 | 90 days supply | Qty: 90 | Fill #1

## 2016-07-20 MED FILL — GLIMEPIRIDE 2 MG TABLET: 2 | 90 days supply | Qty: 90 | Fill #1

## 2016-07-20 MED FILL — HYDROCHLOROTHIAZIDE 25 MG T: 25 | 90 days supply | Qty: 90 | Fill #1

## 2016-07-20 MED FILL — METFORMIN HCL ER 500 MG TAB: 500 | 90 days supply | Qty: 180 | Fill #1

## 2016-08-25 MED FILL — SERTRALINE HCL 100 MG TAB: 100 | 90 days supply | Qty: 90 | Fill #2

## 2016-08-25 MED FILL — LOSARTAN POTASSIUM 100 MG T: 100 | 90 days supply | Qty: 90 | Fill #2

## 2016-09-10 DIAGNOSIS — E119 Type 2 diabetes mellitus without complications: Secondary | ICD-10-CM | POA: Diagnosis not present

## 2016-09-10 DIAGNOSIS — Z7984 Long term (current) use of oral hypoglycemic drugs: Secondary | ICD-10-CM | POA: Diagnosis not present

## 2016-09-10 DIAGNOSIS — I10 Essential (primary) hypertension: Secondary | ICD-10-CM | POA: Diagnosis not present

## 2016-10-13 DIAGNOSIS — J209 Acute bronchitis, unspecified: Secondary | ICD-10-CM | POA: Diagnosis not present

## 2016-10-13 MED FILL — HYDROCODONE-CHLORPHEN ER SU: 10-8 | 8 days supply | Qty: 80 | Fill #0

## 2016-10-13 MED FILL — traZODone HCL 100 MG TABS: 100 | 90 days supply | Qty: 90 | Fill #2

## 2016-10-13 MED FILL — predniSONE 10 MG TABS: 10 | 6 days supply | Qty: 21 | Fill #0

## 2016-10-13 MED FILL — METFORMIN HCL ER 500 MG TAB: 500 | 90 days supply | Qty: 180 | Fill #2

## 2016-10-13 MED FILL — GLIMEPIRIDE 2 MG TABLET: 2 | 90 days supply | Qty: 90 | Fill #2

## 2016-10-13 MED FILL — ATENOLOL 100 MG TAB: 100 | 90 days supply | Qty: 90 | Fill #2

## 2016-10-13 MED FILL — BUPROPION HCL XL 150 MG TAB: 150 | 90 days supply | Qty: 90 | Fill #2

## 2016-10-13 MED FILL — AMLODIPINE BESYLATE 5 MG TA: 5 | 90 days supply | Qty: 90 | Fill #2

## 2016-10-13 MED FILL — HYDROCHLOROTHIAZIDE 25 MG T: 25 | 90 days supply | Qty: 90 | Fill #2

## 2016-10-13 MED FILL — AZITHROMYCIN 250 MG TABLET: 250 | 5 days supply | Qty: 6 | Fill #0

## 2016-10-13 MED FILL — PRAVASTATIN NA 40 MG TAB: 40 | 90 days supply | Qty: 90 | Fill #2

## 2016-12-10 MED FILL — LOSARTAN POTASSIUM 100 MG T: 100 | 90 days supply | Qty: 90 | Fill #3

## 2016-12-10 MED FILL — SERTRALINE HCL 100 MG TAB: 100 | 90 days supply | Qty: 90 | Fill #3

## 2017-01-11 MED FILL — GLIMEPIRIDE 2 MG TABLET: 2 | 90 days supply | Qty: 90 | Fill #3

## 2017-01-11 MED FILL — METFORMIN HCL ER 500 MG TAB: 500 | 90 days supply | Qty: 180 | Fill #3

## 2017-01-11 MED FILL — AMLODIPINE BESYLATE 5 MG TA: 5 | 90 days supply | Qty: 90 | Fill #3

## 2017-01-11 MED FILL — traZODone HCL 100 MG TABS: 100 | 90 days supply | Qty: 90 | Fill #3

## 2017-01-11 MED FILL — PRAVASTATIN NA 40 MG TAB: 40 | 90 days supply | Qty: 90 | Fill #3

## 2017-01-11 MED FILL — BUPROPION HCL XL 150 MG TAB: 150 | 90 days supply | Qty: 90 | Fill #3

## 2017-01-11 MED FILL — HYDROCHLOROTHIAZIDE 25 MG T: 25 | 90 days supply | Qty: 90 | Fill #3

## 2017-01-11 MED FILL — ATENOLOL 100 MG TAB: 100 | 90 days supply | Qty: 90 | Fill #3

## 2017-02-16 ENCOUNTER — Other Ambulatory Visit: Payer: Self-pay | Admitting: Family Medicine

## 2017-02-16 DIAGNOSIS — Z01419 Encounter for gynecological examination (general) (routine) without abnormal findings: Secondary | ICD-10-CM | POA: Diagnosis not present

## 2017-02-16 DIAGNOSIS — Z1231 Encounter for screening mammogram for malignant neoplasm of breast: Secondary | ICD-10-CM

## 2017-03-05 MED FILL — SERTRALINE HCL 100 MG TAB: 100 | 90 days supply | Qty: 90 | Fill #4

## 2017-03-05 MED FILL — LOSARTAN POTASSIUM 100 MG T: 100 | 90 days supply | Qty: 90 | Fill #4

## 2017-03-22 ENCOUNTER — Ambulatory Visit
Admission: RE | Admit: 2017-03-22 | Discharge: 2017-03-22 | Disposition: A | Discharge status: Home / Self Care | Payer: 59 | Source: Ambulatory Visit | Attending: Family Medicine | Admitting: Family Medicine

## 2017-03-22 DIAGNOSIS — Z1231 Encounter for screening mammogram for malignant neoplasm of breast: Secondary | ICD-10-CM | POA: Diagnosis not present

## 2017-04-08 MED FILL — AMLODIPINE BESYLATE 5 MG TA: 5 | 90 days supply | Qty: 90 | Fill #0

## 2017-04-08 MED FILL — PRAVASTATIN NA 40 MG TAB: 40 | 90 days supply | Qty: 90 | Fill #0

## 2017-04-08 MED FILL — GLIMEPIRIDE 2 MG TABLET: 2 | 90 days supply | Qty: 90 | Fill #0

## 2017-04-08 MED FILL — ATENOLOL 100 MG TAB: 100 | 90 days supply | Qty: 90 | Fill #0

## 2017-04-08 MED FILL — traZODone HCL 100 MG TABS: 100 | 90 days supply | Qty: 90 | Fill #0

## 2017-04-08 MED FILL — HYDROCHLOROTHIAZIDE 25 MG T: 25 | 90 days supply | Qty: 90 | Fill #0

## 2017-04-08 MED FILL — METFORMIN HCL ER 500 MG TAB: 500 | 90 days supply | Qty: 180 | Fill #0

## 2017-04-14 DIAGNOSIS — E7800 Pure hypercholesterolemia, unspecified: Secondary | ICD-10-CM | POA: Diagnosis not present

## 2017-04-14 DIAGNOSIS — F324 Major depressive disorder, single episode, in partial remission: Secondary | ICD-10-CM | POA: Diagnosis not present

## 2017-04-14 DIAGNOSIS — Z0000 Encounter for general adult medical examination without abnormal findings: Secondary | ICD-10-CM | POA: Diagnosis not present

## 2017-04-14 DIAGNOSIS — E119 Type 2 diabetes mellitus without complications: Secondary | ICD-10-CM | POA: Diagnosis not present

## 2017-04-14 DIAGNOSIS — Z9884 Bariatric surgery status: Secondary | ICD-10-CM | POA: Diagnosis not present

## 2017-04-14 DIAGNOSIS — I10 Essential (primary) hypertension: Secondary | ICD-10-CM | POA: Diagnosis not present

## 2017-04-14 MED FILL — BUPROPION HCL XL 150 MG TAB: 150 | 90 days supply | Qty: 90 | Fill #0

## 2017-04-15 MED FILL — SHINGRIX VIAL KIT: 50 | 1 days supply | Qty: 1 | Fill #0

## 2017-05-04 ENCOUNTER — Ambulatory Visit (INDEPENDENT_AMBULATORY_CARE_PROVIDER_SITE_OTHER): Payer: Self-pay | Admitting: Emergency Medicine

## 2017-05-04 VITALS — BP 128/82 | HR 95 | Temp 98.5°F | Resp 18 | Wt 234.4 lb

## 2017-05-04 DIAGNOSIS — M25462 Effusion, left knee: Secondary | ICD-10-CM

## 2017-05-04 DIAGNOSIS — W182XXA Fall in (into) shower or empty bathtub, initial encounter: Secondary | ICD-10-CM | POA: Diagnosis not present

## 2017-05-04 DIAGNOSIS — M25562 Pain in left knee: Secondary | ICD-10-CM | POA: Diagnosis not present

## 2017-05-04 DIAGNOSIS — S8992XA Unspecified injury of left lower leg, initial encounter: Secondary | ICD-10-CM

## 2017-05-04 MED ORDER — MELOXICAM 15 MG PO TABS: 15.0000 mg | ORAL_TABLET | Freq: Every day | ORAL | 0 refills | Status: DC

## 2017-05-04 MED FILL — MELOXICAM 15 MG TABLET: 15 | 30 days supply | Qty: 30 | Fill #0

## 2017-05-04 MED FILL — traMADol HCL 50 MG TABS: 50 | 7 days supply | Qty: 21 | Fill #0

## 2017-05-04 MED FILL — HYDROCODON-APAP 5-325: 5-325 | 15 days supply | Qty: 15 | Fill #0

## 2017-05-04 NOTE — Patient Instructions (Signed)
RICE for Routine Care of Injuries Many injuries can be cared for using rest, ice, compression, and elevation (RICE therapy). Using RICE therapy can help to lessen pain and swelling. It can help your body to heal. Rest Reduce your normal activities and avoid using the injured part of your body. You can go back to your normal activities when you feel okay and your doctor says it is okay. Ice Do not put ice on your bare skin.  Put ice in a plastic bag.  Place a towel between your skin and the bag.  Leave the ice on for 20 minutes, 2-3 times a day.  Do this for as long as told by your doctor. Compression Compression means putting pressure on the injured area. This can be done with an elastic bandage. If an elastic bandage has been applied:  Remove and reapply the bandage every 3-4 hours or as told by your doctor.  Make sure the bandage is not wrapped too tight. Wrap the bandage more loosely if part of your body beyond the bandage is blue, swollen, cold, painful, or loses feeling (numb).  See your doctor if the bandage seems to make your problems worse.  Elevation Elevation means keeping the injured area raised. Raise the injured area above your heart or the center of your chest if you can. When should I get help? You should get help if:  You keep having pain and swelling.  Your symptoms get worse.  Get help right away if: You should get help right away if:  You have sudden bad pain at or below the area of your injury.  You have redness or more swelling around your injury.  You have tingling or numbness at or below the injury that does not go away when you take off the bandage.  This information is not intended to replace advice given to you by your health care provider. Make sure you discuss any questions you have with your health care provider. Document Released: 09/30/2007 Document Revised: 03/10/2016 Document Reviewed: 03/21/2014 Elsevier Interactive Patient Education  2017  Elsevier Inc.  

## 2017-05-04 NOTE — Progress Notes (Signed)
Subjective:    Karina Oneal is a 62 y.o. female who presents with a knee injury involving the left knee. Onset was sudden, related to a fall from standing. Mechanism of injury: fall. Inciting event: slipped in the shower. Current symptoms include: pain located left medial kneed, stiffness and swelling. Pain is aggravated by any weight bearing, going up and down stairs, inactivity and standing. Patient has had no prior knee problems. Evaluation to date: none. Treatment to date: avoidance of offending activity, OTC analgesics which are somewhat effective and rest.  The following portions of the patient's history were reviewed and updated as appropriate: allergies and current medications.   Review of Systems Pertinent items are noted in HPI.   Objective:    BP 128/82 (BP Location: Right Arm, Patient Position: Sitting, Cuff Size: Large)   Pulse 95   Temp 98.5 F (36.9 C) (Oral)   Resp 18   Wt 234 lb 6.4 oz (106.3 kg)   SpO2 95%   BMI 39.01 kg/m  Right knee: normal and no effusion, full active range of motion, no joint line tenderness, ligamentous structures intact.  Left knee:  positive exam findings: effusion and medial joint line tenderness and negative exam findings: LCL stable, no patellar laxity, no crepitus and negative draw sign   Assessment:    Left Mild medial collateral ligament sprain on the left along with effusion    Plan:    Educational materials distributed. Rest, ice, compression, and elevation (RICE) therapy. NSAIDs per medication orders. OTC analgesics as needed. Orthopedics referral. Follow up in 3 days. Explained lack of imaging, recommend following up with orthopedics or the urgent care if symptoms fail to resolve or if they worsen in anyways.

## 2017-05-06 ENCOUNTER — Telehealth: Payer: Self-pay | Admitting: Emergency Medicine

## 2017-05-06 NOTE — Telephone Encounter (Signed)
Patient returned my phone call about her follow up visit and stated she is doing much better and thanked me for the call. She stated she did not have any futer questions for me at this time

## 2017-05-11 DIAGNOSIS — M25569 Pain in unspecified knee: Secondary | ICD-10-CM | POA: Diagnosis not present

## 2017-06-03 ENCOUNTER — Encounter (HOSPITAL_COMMUNITY): Payer: Self-pay

## 2017-06-07 MED FILL — LOSARTAN POTASSIUM 100 MG T: 100 | 90 days supply | Qty: 90 | Fill #0

## 2017-06-07 MED FILL — SERTRALINE HCL 100 MG TAB: 100 | 90 days supply | Qty: 90 | Fill #0

## 2017-06-17 MED FILL — SHINGRIX VIAL KIT: 50 | 1 days supply | Qty: 1 | Fill #1

## 2017-07-02 MED FILL — AMLODIPINE BESYLATE 5 MG TA: 5 | 90 days supply | Qty: 90 | Fill #0

## 2017-07-02 MED FILL — buPROPion HCL ER (XL) 150 M: 150 | 90 days supply | Qty: 90 | Fill #1

## 2017-07-02 MED FILL — ATENOLOL 100 MG TAB: 100 | 90 days supply | Qty: 90 | Fill #0

## 2017-07-02 MED FILL — METFORMIN HCL ER 500 MG TAB: 500 | 90 days supply | Qty: 180 | Fill #0

## 2017-07-02 MED FILL — GLIMEPIRIDE 2 MG TABLET: 2 | 90 days supply | Qty: 90 | Fill #0

## 2017-07-02 MED FILL — PRAVASTATIN NA 40 MG TAB: 40 | 90 days supply | Qty: 90 | Fill #0

## 2017-07-02 MED FILL — HYDROCHLOROTHIAZIDE 25 MG T: 25 | 90 days supply | Qty: 90 | Fill #0

## 2017-07-02 MED FILL — traZODone HCL 100 MG TABS: 100 | 90 days supply | Qty: 90 | Fill #0

## 2017-07-20 MED FILL — DEXAMETHASONE 2 MG TABLET: 2 | 5 days supply | Qty: 10 | Fill #0

## 2017-07-20 MED FILL — diazePAM 10 MG TABS: 10 | 2 days supply | Qty: 2 | Fill #0

## 2017-07-20 MED FILL — AMOXICILLIN 500 MG CAPSULE: 500 | 10 days supply | Qty: 30 | Fill #0

## 2017-08-26 MED FILL — LOSARTAN POTASSIUM 100 MG T: 100 | 90 days supply | Qty: 90 | Fill #1

## 2017-09-03 MED FILL — SERTRALINE HCL 100 MG TAB: 100 | 90 days supply | Qty: 90 | Fill #1

## 2017-09-17 MED FILL — AZITHROMYCIN 250 MG TABLET: 250 | 5 days supply | Qty: 6 | Fill #0

## 2017-09-22 DIAGNOSIS — K5792 Diverticulitis of intestine, part unspecified, without perforation or abscess without bleeding: Secondary | ICD-10-CM | POA: Diagnosis not present

## 2017-09-22 MED FILL — FLUCONAZOLE 150 MG TABS: 150 | 1 days supply | Qty: 1 | Fill #0

## 2017-09-22 MED FILL — DICYCLOMINE 20 MG TABLET: 20 | 5 days supply | Qty: 20 | Fill #0

## 2017-09-30 MED FILL — AMLODIPINE BESYLATE 5 MG TA: 5 | 90 days supply | Qty: 90 | Fill #1

## 2017-09-30 MED FILL — PRAVASTATIN NA 40 MG TAB: 40 | 90 days supply | Qty: 90 | Fill #1

## 2017-09-30 MED FILL — BUPROPION HCL XL 150 MG TAB: 150 | 90 days supply | Qty: 90 | Fill #2

## 2017-09-30 MED FILL — ATENOLOL 100 MG TAB: 100 | 90 days supply | Qty: 90 | Fill #1

## 2017-09-30 MED FILL — METFORMIN HCL ER 500 MG TAB: 500 | 90 days supply | Qty: 180 | Fill #1

## 2017-09-30 MED FILL — traZODone HCL 100 MG TABS: 100 | 90 days supply | Qty: 90 | Fill #1

## 2017-09-30 MED FILL — GLIMEPIRIDE 2 MG TABLET: 2 | 90 days supply | Qty: 90 | Fill #1

## 2017-09-30 MED FILL — HYDROCHLOROTHIAZIDE 25 MG T: 25 | 90 days supply | Qty: 90 | Fill #1

## 2017-10-14 DIAGNOSIS — K5792 Diverticulitis of intestine, part unspecified, without perforation or abscess without bleeding: Secondary | ICD-10-CM | POA: Diagnosis not present

## 2017-10-14 DIAGNOSIS — E119 Type 2 diabetes mellitus without complications: Secondary | ICD-10-CM | POA: Diagnosis not present

## 2017-10-15 MED FILL — metroNIDAZOLE 500 MG TABS: 500 | 10 days supply | Qty: 20 | Fill #0

## 2017-10-15 MED FILL — FLUCONAZOLE 150 MG TABS: 150 | 2 days supply | Qty: 2 | Fill #0

## 2017-12-02 MED FILL — LOSARTAN POTASSIUM 100 MG T: 100 | 90 days supply | Qty: 90 | Fill #2

## 2017-12-02 MED FILL — SERTRALINE HCL 100 MG TAB: 100 | 90 days supply | Qty: 90 | Fill #2

## 2017-12-21 MED FILL — IBUPROFEN 800 MG TAB: 800 | 10 days supply | Qty: 30 | Fill #0

## 2017-12-21 MED FILL — AMOXICILLIN 500 MG CAPSULE: 500 | 10 days supply | Qty: 30 | Fill #0

## 2018-01-03 MED FILL — AMLODIPINE BESYLATE 5 MG TA: 5 | 90 days supply | Qty: 90 | Fill #2

## 2018-01-03 MED FILL — METFORMIN HCL ER 500 MG TAB: 500 | 90 days supply | Qty: 180 | Fill #2

## 2018-01-03 MED FILL — ATENOLOL 100 MG TAB: 100 | 90 days supply | Qty: 90 | Fill #2

## 2018-01-03 MED FILL — PRAVASTATIN NA 40 MG TAB: 40 | 90 days supply | Qty: 90 | Fill #2

## 2018-01-03 MED FILL — buPROPion HCL ER (XL) 150 M: 150 | 90 days supply | Qty: 90 | Fill #3

## 2018-01-03 MED FILL — traZODone HCL 100 MG TABS: 100 | 90 days supply | Qty: 90 | Fill #2

## 2018-01-31 MED FILL — HYDROCHLOROTHIAZIDE 25 MG T: 25 | 90 days supply | Qty: 90 | Fill #2

## 2018-01-31 MED FILL — GLIMEPIRIDE 2 MG TABLET: 2 | 90 days supply | Qty: 90 | Fill #2

## 2018-02-17 DIAGNOSIS — Z6836 Body mass index (BMI) 36.0-36.9, adult: Secondary | ICD-10-CM | POA: Diagnosis not present

## 2018-02-17 DIAGNOSIS — R300 Dysuria: Secondary | ICD-10-CM | POA: Diagnosis not present

## 2018-02-17 DIAGNOSIS — Z01411 Encounter for gynecological examination (general) (routine) with abnormal findings: Secondary | ICD-10-CM | POA: Diagnosis not present

## 2018-02-17 DIAGNOSIS — E669 Obesity, unspecified: Secondary | ICD-10-CM | POA: Diagnosis not present

## 2018-02-17 DIAGNOSIS — Z720 Tobacco use: Secondary | ICD-10-CM | POA: Diagnosis not present

## 2018-02-17 MED FILL — FLUCONAZOLE 150 MG TABS: 150 | 2 days supply | Qty: 2 | Fill #0

## 2018-02-17 MED FILL — NITROFURANTOIN MCR 100 MG C: 100 | 7 days supply | Qty: 14 | Fill #0

## 2018-03-02 MED FILL — LOSARTAN POTASSIUM 100 MG T: 100 | 90 days supply | Qty: 90 | Fill #3

## 2018-03-02 MED FILL — SERTRALINE HCL 100 MG TAB: 100 | 90 days supply | Qty: 90 | Fill #3

## 2018-04-06 MED FILL — AMLODIPINE BESYLATE 5 MG TA: 5 | 90 days supply | Qty: 90 | Fill #3

## 2018-04-06 MED FILL — metFORMIN HCL ER 500 MG TB2: 500 | 90 days supply | Qty: 180 | Fill #3

## 2018-04-06 MED FILL — PRAVASTATIN NA 40 MG TAB: 40 | 90 days supply | Qty: 90 | Fill #3

## 2018-04-06 MED FILL — traZODone HCL 100 MG TABS: 100 | 90 days supply | Qty: 90 | Fill #3

## 2018-04-06 MED FILL — buPROPion HCL ER (XL) 150 M: 150 | 90 days supply | Qty: 90 | Fill #4

## 2018-04-06 MED FILL — ATENOLOL 100 MG TAB: 100 | 90 days supply | Qty: 90 | Fill #3

## 2018-05-02 MED FILL — GLIMEPIRIDE 2 MG TABLET: 2 | 90 days supply | Qty: 90 | Fill #0

## 2018-05-02 MED FILL — HYDROCHLOROTHIAZIDE 25 MG T: 25 | 90 days supply | Qty: 90 | Fill #0

## 2018-05-03 MED FILL — FREESTYLE LITE METER: 30 days supply | Qty: 1 | Fill #0

## 2018-05-03 MED FILL — FREESTYLE LANCETS: 90 days supply | Qty: 100 | Fill #0

## 2018-05-03 MED FILL — FREESTYLE LITE TEST STRIP: 90 days supply | Qty: 100 | Fill #0

## 2018-05-03 MED FILL — SM BLOOD PRESSURE MONITOR: 30 days supply | Qty: 1 | Fill #0

## 2018-06-06 MED FILL — SERTRALINE HCL 100 MG TAB: 100 | 90 days supply | Qty: 90 | Fill #0

## 2018-06-06 MED FILL — LOSARTAN POTASSIUM 100 MG T: 100 | 90 days supply | Qty: 90 | Fill #0

## 2018-07-04 MED FILL — buPROPion HCL ER (XL) 150 M: 150 | 90 days supply | Qty: 90 | Fill #0

## 2018-07-04 MED FILL — traZODone HCL 100 MG TABS: 100 | 90 days supply | Qty: 90 | Fill #0

## 2018-07-04 MED FILL — ATENOLOL 100 MG TAB: 100 | 90 days supply | Qty: 90 | Fill #0

## 2018-07-04 MED FILL — SERTRALINE HCL 50 MG TABLET: 50 | 30 days supply | Qty: 45 | Fill #0

## 2018-07-04 MED FILL — PRAVASTATIN NA 40 MG TAB: 40 | 90 days supply | Qty: 90 | Fill #0

## 2018-07-04 MED FILL — AMLODIPINE BESYLATE 5 MG TA: 5 | 90 days supply | Qty: 90 | Fill #0

## 2018-07-04 MED FILL — metFORMIN HCL ER 500 MG TB2: 500 | 90 days supply | Qty: 180 | Fill #0

## 2018-07-19 MED FILL — NITROFURANTOIN MONO-MCR 100: 100 | 5 days supply | Qty: 10 | Fill #0

## 2018-07-19 MED FILL — FLUCONAZOLE 150 MG TABS: 150 | 5 days supply | Qty: 5 | Fill #0

## 2018-08-02 MED FILL — HYDROCHLOROTHIAZIDE 25 MG T: 25 | 90 days supply | Qty: 90 | Fill #0

## 2018-08-31 MED FILL — SERTRALINE HCL 100 MG TAB: 100 | 90 days supply | Qty: 90 | Fill #0

## 2018-08-31 MED FILL — LOSARTAN POTASSIUM 100 MG T: 100 | 30 days supply | Qty: 30 | Fill #0 | Status: TO

## 2018-09-15 ENCOUNTER — Encounter: Payer: Self-pay | Admitting: Family Medicine

## 2018-09-16 ENCOUNTER — Telehealth: Payer: Self-pay | Admitting: *Deleted

## 2018-09-16 MED FILL — GLIMEPIRIDE 2 MG TABLET: 2 | 90 days supply | Qty: 90 | Fill #0

## 2018-09-16 MED FILL — CEPHALEXIN 500 MG CAPSULE: 500 | 7 days supply | Qty: 21 | Fill #0

## 2018-09-16 NOTE — Telephone Encounter (Signed)
REFERRAL SENT TO SCHEDULING AND NOTES ON FILE FROM [facility] [phone] DR. ALAN ROSS.Marland Kitchen

## 2018-09-17 MED FILL — NITROGLYCERIN 0.4 MG TAB SL: 0.4 | 13 days supply | Qty: 25 | Fill #0

## 2018-09-29 ENCOUNTER — Other Ambulatory Visit: Payer: Self-pay | Admitting: Family Medicine

## 2018-10-03 MED FILL — AMLODIPINE BESYLATE 5 MG TA: 5 | 90 days supply | Qty: 90 | Fill #1

## 2018-10-03 MED FILL — ATENOLOL 100 MG TAB: 100 | 90 days supply | Qty: 90 | Fill #1

## 2018-10-03 MED FILL — buPROPion HCL ER (XL) 150 M: 150 | 90 days supply | Qty: 90 | Fill #1

## 2018-10-03 MED FILL — PRAVASTATIN NA 40 MG TAB: 40 | 90 days supply | Qty: 90 | Fill #1

## 2018-10-04 MED FILL — traZODone HCL 100 MG TABS: 100 | 90 days supply | Qty: 90 | Fill #1

## 2018-10-04 MED FILL — metFORMIN HCL ER 500 MG TB2: 500 | 90 days supply | Qty: 180 | Fill #1

## 2018-10-04 MED FILL — LOSARTAN POTASSIUM 100 MG T: 100 | 30 days supply | Qty: 30 | Fill #0

## 2018-10-05 ENCOUNTER — Telehealth: Payer: Self-pay

## 2018-10-05 NOTE — Telephone Encounter (Signed)
Virtual Visit Pre-Appointment Phone Call  "(Name), I am calling you today to discuss your upcoming appointment. We are currently trying to limit exposure to the virus that causes COVID-19 by seeing patients at home rather than in the office."  1. "What is the BEST phone number to call the day of the visit?" - include this in appointment notes  2. "Do you have or have access to (through a family member/friend) a smartphone with video capability that we can use for your visit?" a. If yes - list this number in appt notes as "cell" (if different from BEST phone #) and list the appointment type as a VIDEO visit in appointment notes b. If no - list the appointment type as a PHONE visit in appointment notes  3. Confirm consent - "In the setting of the current Covid19 crisis, you are scheduled for a (phone or video) visit with your provider on (date) at (time).  Just as we do with many in-office visits, in order for you to participate in this visit, we must obtain consent.  If you'd like, I can send this to your mychart (if signed up) or email for you to review.  Otherwise, I can obtain your verbal consent now.  All virtual visits are billed to your insurance company just like a normal visit would be.  By agreeing to a virtual visit, we'd like you to understand that the technology does not allow for your provider to perform an examination, and thus may limit your provider's ability to fully assess your condition. If your provider identifies any concerns that need to be evaluated in person, we will make arrangements to do so.  Finally, though the technology is pretty good, we cannot assure that it will always work on either your or our end, and in the setting of a video visit, we may have to convert it to a phone-only visit.  In either situation, we cannot ensure that we have a secure connection.  Are you willing to proceed?" STAFF: Did the patient verbally acknowledge consent to telehealth visit? Document  YES/NO here: YES  4. Advise patient to be prepared - "Two hours prior to your appointment, go ahead and check your blood pressure, pulse, oxygen saturation, and your weight (if you have the equipment to check those) and write them all down. When your visit starts, your provider will ask you for this information. If you have an Apple Watch or Kardia device, please plan to have heart rate information ready on the day of your appointment. Please have a pen and paper handy nearby the day of the visit as well."  5. Give patient instructions for MyChart download to smartphone OR [email or web address] as below if video visit (depending on what platform provider is using)  6. Inform patient they will receive a phone call 15 minutes prior to their appointment time (may be from unknown caller ID) so they should be prepared to answer    TELEPHONE CALL NOTE  ROSALYNN SERGENT has been deemed a candidate for a follow-up tele-health visit to limit community exposure during the Covid-19 pandemic. I spoke with the patient via phone to ensure availability of phone/video source, confirm preferred email & phone number, and discuss instructions and expectations.  I reminded KENZA MUNAR to be prepared with any vital sign and/or heart rhythm information that could potentially be obtained via home monitoring, at the time of her visit. I reminded LYLLIAN GAUSE to expect a phone call prior to  her visit.  Cavin Longman, CMA 10/05/2018 10:13 AM   INSTRUCTIONS FOR DOWNLOADING THE MYCHART APP TO SMARTPHONE  - The patient must first make sure to have activated MyChart and know their login information - If Apple, go to [employer] Store and type in MyChart in the search bar and download the app. If Android, ask patient to go to [employer] Play Store and type in [place] in the search bar and download the app. The app is free but as with any other app downloads, their phone may require them to verify saved payment information or Apple/Android  password.  - The patient will need to then log into the app with their MyChart username and password, and select [facility] as their healthcare provider to link the account. When it is time for your visit, go to the MyChart app, find appointments, and click Begin Video Visit. Be sure to Select Allow for your device to access the Microphone and Camera for your visit. You will then be connected, and your provider will be with you shortly.  **If they have any issues connecting, or need assistance please contact MyChart service desk [phone])**  **If using a computer, in order to ensure the best quality for their visit they will need to use either of the following Internet Browsers: [employer] Edge, or Google Chrome**  IF USING DOXIMITY or DOXY.ME - The patient will receive a link just prior to their visit by text.     FULL LENGTH CONSENT FOR TELE-HEALTH VISIT   I hereby voluntarily request, consent and authorize [facility] and its employed or contracted physicians, physician assistants, nurse practitioners or other licensed health care professionals (the Practitioner), to provide me with telemedicine health care services (the "Services") as deemed necessary by the treating Practitioner. I acknowledge and consent to receive the Services by the Practitioner via telemedicine. I understand that the telemedicine visit will involve communicating with the Practitioner through live audiovisual communication technology and the disclosure of certain medical information by electronic transmission. I acknowledge that I have been given the opportunity to request an in-person assessment or other available alternative prior to the telemedicine visit and am voluntarily participating in the telemedicine visit.  I understand that I have the right to withhold or withdraw my consent to the use of telemedicine in the course of my care at any time, without affecting my right to future care or treatment,  and that the Practitioner or I may terminate the telemedicine visit at any time. I understand that I have the right to inspect all information obtained and/or recorded in the course of the telemedicine visit and may receive copies of available information for a reasonable fee.  I understand that some of the potential risks of receiving the Services via telemedicine include:  Marland Kitchen. Delay or interruption in medical evaluation due to technological equipment failure or disruption; . Information transmitted may not be sufficient (e.g. poor resolution of images) to allow for appropriate medical decision making by the Practitioner; and/or  . In rare instances, security protocols could fail, causing a breach of personal health information.  Furthermore, I acknowledge that it is my responsibility to provide information about my medical history, conditions and care that is complete and accurate to the best of my ability. I acknowledge that Practitioner's advice, recommendations, and/or decision may be based on factors not within their control, such as incomplete or inaccurate data provided by me or distortions of diagnostic images or specimens that may result from electronic transmissions. I  understand that the practice of medicine is not an exact science and that Practitioner makes no warranties or guarantees regarding treatment outcomes. I acknowledge that I will receive a copy of this consent concurrently upon execution via email to the email address I last provided but may also request a printed copy by calling the office of [facility].    I understand that my insurance will be billed for this visit.   I have read or had this consent read to me. . I understand the contents of this consent, which adequately explains the benefits and risks of the Services being provided via telemedicine.  . I have been provided ample opportunity to ask questions regarding this consent and the Services and have had my questions  answered to my satisfaction. . I give my informed consent for the services to be provided through the use of telemedicine in my medical care  By participating in this telemedicine visit I agree to the above.

## 2018-10-06 ENCOUNTER — Encounter: Payer: Self-pay | Admitting: Internal Medicine

## 2018-10-06 ENCOUNTER — Telehealth: Payer: Self-pay

## 2018-10-06 ENCOUNTER — Telehealth (INDEPENDENT_AMBULATORY_CARE_PROVIDER_SITE_OTHER): Payer: 59 | Admitting: Internal Medicine

## 2018-10-06 ENCOUNTER — Other Ambulatory Visit: Payer: Self-pay

## 2018-10-06 VITALS — Ht 65.0 in | Wt 218.0 lb

## 2018-10-06 DIAGNOSIS — R0600 Dyspnea, unspecified: Secondary | ICD-10-CM

## 2018-10-06 DIAGNOSIS — I10 Essential (primary) hypertension: Secondary | ICD-10-CM

## 2018-10-06 DIAGNOSIS — I2510 Atherosclerotic heart disease of native coronary artery without angina pectoris: Secondary | ICD-10-CM

## 2018-10-06 DIAGNOSIS — E785 Hyperlipidemia, unspecified: Secondary | ICD-10-CM
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
## 2018-10-06 NOTE — Telephone Encounter (Signed)

## 2018-10-07 MED FILL — PEG-3350 SOLUTION: 420 | 1 days supply | Qty: 4000 | Fill #0

## 2018-11-02 MED FILL — LOSARTAN POTASSIUM 100 MG T: 100 | 30 days supply | Qty: 30 | Fill #1

## 2018-11-02 MED FILL — HYDROCHLOROTHIAZIDE 25 MG T: 25 | 90 days supply | Qty: 90 | Fill #0

## 2018-11-30 MED FILL — LOSARTAN POTASSIUM 100 MG T: 100 | 30 days supply | Qty: 30 | Fill #2

## 2018-12-08 MED FILL — PIOGLITAZONE HCL 15 MG TAB: 15 | 30 days supply | Qty: 30 | Fill #0

## 2018-12-12 MED FILL — SERTRALINE HCL 100 MG TAB: 100 | 90 days supply | Qty: 90 | Fill #0

## 2018-12-20 MED FILL — HYDROCODONE-HOMATROPINE SOL: 5-1.5 | 5 days supply | Qty: 100 | Fill #0

## 2018-12-20 MED FILL — AMOXICILLIN-CLAV ER 1,000-6: 1000-62.5 | 5 days supply | Qty: 20 | Fill #0

## 2018-12-20 MED FILL — AZITHROMYCIN 250 MG TABLET: 250 | 5 days supply | Qty: 6 | Fill #0

## 2018-12-20 MED FILL — predniSONE 10 MG TABS: 10 | 5 days supply | Qty: 5 | Fill #0

## 2018-12-27 MED FILL — levoFLOXacin 500 MG TABS: 500 | 10 days supply | Qty: 10 | Fill #0

## 2018-12-27 MED FILL — HYDROCODONE-HOMATROPINE SOL: 5-1.5 | 5 days supply | Qty: 100 | Fill #0

## 2018-12-27 MED FILL — FLUCONAZOLE 150 MG TABLET: 150 | 3 days supply | Qty: 3 | Fill #0

## 2018-12-27 MED FILL — predniSONE 10 MG TABS: 10 | 5 days supply | Qty: 25 | Fill #0

## 2018-12-29 MED FILL — LOSARTAN POTASSIUM 100 MG T: 100 | 30 days supply | Qty: 30 | Fill #3

## 2018-12-29 MED FILL — AMLODIPINE BESYLATE 5 MG TA: 5 | 90 days supply | Qty: 90 | Fill #2

## 2018-12-29 MED FILL — buPROPion HCL ER (XL) 150 M: 150 | 90 days supply | Qty: 90 | Fill #2

## 2019-01-12 MED FILL — PIOGLITAZONE HCL 15 MG TAB: 15 | 30 days supply | Qty: 30 | Fill #1

## 2019-01-30 MED FILL — HYDROCHLOROTHIAZIDE 25 MG T: 25 | 90 days supply | Qty: 90 | Fill #1

## 2019-01-30 MED FILL — traZODone HCL 100 MG TABS: 100 | 90 days supply | Qty: 90 | Fill #2

## 2019-01-30 MED FILL — metFORMIN HCL ER 500 MG TB2: 500 | 90 days supply | Qty: 180 | Fill #2

## 2019-01-30 MED FILL — ATENOLOL 100 MG TAB: 100 | 90 days supply | Qty: 90 | Fill #2

## 2019-01-30 MED FILL — LOSARTAN POTASSIUM 100 MG T: 100 | 90 days supply | Qty: 90 | Fill #4

## 2019-02-08 MED FILL — PIOGLITAZONE HCL 15 MG TAB: 15 | 30 days supply | Qty: 30 | Fill #2

## 2019-03-13 MED FILL — PIOGLITAZONE HCL 15 MG TAB: 15 | 90 days supply | Qty: 90 | Fill #3

## 2019-03-13 MED FILL — SERTRALINE HCL 100 MG TAB: 100 | 90 days supply | Qty: 90 | Fill #1

## 2019-04-03 MED FILL — buPROPion HCL ER (XL) 150 M: 150 | 90 days supply | Qty: 90 | Fill #3

## 2019-04-03 MED FILL — AMLODIPINE BESYLATE 5 MG TA: 5 | 90 days supply | Qty: 90 | Fill #3

## 2019-05-02 MED FILL — HYDROCHLOROTHIAZIDE 25 MG T: 25 | 90 days supply | Qty: 90 | Fill #2

## 2019-05-02 MED FILL — METFORMIN HCL ER 500 MG TB2: 500 | 90 days supply | Qty: 180 | Fill #3

## 2019-05-02 MED FILL — LOSARTAN POTASSIUM 100 MG T: 100 | 90 days supply | Qty: 90 | Fill #5

## 2019-05-02 MED FILL — traZODone HCL 100 MG TABS: 100 | 90 days supply | Qty: 90 | Fill #3

## 2019-05-11 ENCOUNTER — Ambulatory Visit: Payer: No Typology Code available for payment source | Attending: Internal Medicine

## 2019-05-11 DIAGNOSIS — Z20822 Contact with and (suspected) exposure to covid-19: Secondary | ICD-10-CM | POA: Insufficient documentation

## 2019-05-12 LAB — NOVEL CORONAVIRUS, NAA: SARS-CoV-2, NAA: NOT DETECTED

## 2019-06-08 MED FILL — ATENOLOL 100 MG TABLET: 100 | 90 days supply | Qty: 90 | Fill #3

## 2019-06-08 MED FILL — PIOGLITAZONE HCL 15 MG TAB: 15 | 30 days supply | Qty: 30 | Fill #4

## 2019-06-09 MED FILL — SERTRALINE HCL 100 MG TAB: 100 | 90 days supply | Qty: 90 | Fill #0

## 2019-07-05 MED FILL — buPROPion HCL ER (XL) 150 M: 150 | 90 days supply | Qty: 90 | Fill #0

## 2019-07-05 MED FILL — AMLODIPINE BESYLATE 5 MG TA: 5 | 90 days supply | Qty: 90 | Fill #0

## 2019-07-06 MED FILL — PIOGLITAZONE HCL 15 MG TAB: 15 | 90 days supply | Qty: 90 | Fill #0

## 2019-07-19 ENCOUNTER — Other Ambulatory Visit (HOSPITAL_COMMUNITY): Payer: Self-pay | Admitting: Family Medicine

## 2019-07-19 MED FILL — ATENOLOL 50 MG TABLET: 50 | 90 days supply | Qty: 90 | Fill #0

## 2019-07-19 MED FILL — traZODone HCL 100 MG TABS: 100 | 90 days supply | Qty: 180 | Fill #0

## 2019-07-31 ENCOUNTER — Other Ambulatory Visit: Payer: Self-pay | Admitting: *Deleted

## 2019-07-31 DIAGNOSIS — Z87891 Personal history of nicotine dependence: Secondary | ICD-10-CM

## 2019-07-31 DIAGNOSIS — F17210 Nicotine dependence, cigarettes, uncomplicated: Secondary | ICD-10-CM

## 2019-08-04 MED FILL — LOSARTAN POTASSIUM 100 MG T: 100 | 90 days supply | Qty: 90 | Fill #0

## 2019-08-04 MED FILL — HYDROCHLOROTHIAZIDE 25 MG T: 25 | 90 days supply | Qty: 90 | Fill #0

## 2019-08-08 ENCOUNTER — Other Ambulatory Visit: Payer: Self-pay

## 2019-08-08 ENCOUNTER — Ambulatory Visit: Payer: No Typology Code available for payment source | Admitting: Cardiology

## 2019-08-08 ENCOUNTER — Encounter: Payer: Self-pay | Admitting: Cardiology

## 2019-08-08 VITALS — BP 151/69 | HR 58 | Temp 98.1°F | Resp 15 | Ht 65.0 in | Wt 224.0 lb

## 2019-08-08 DIAGNOSIS — R001 Bradycardia, unspecified: Secondary | ICD-10-CM

## 2019-08-08 DIAGNOSIS — I10 Essential (primary) hypertension: Secondary | ICD-10-CM

## 2019-08-08 DIAGNOSIS — E1169 Type 2 diabetes mellitus with other specified complication: Secondary | ICD-10-CM

## 2019-08-08 DIAGNOSIS — R42 Dizziness and giddiness: Secondary | ICD-10-CM

## 2019-08-08 DIAGNOSIS — Z720 Tobacco use: Secondary | ICD-10-CM

## 2019-08-08 DIAGNOSIS — E782 Mixed hyperlipidemia: Secondary | ICD-10-CM

## 2019-08-08 DIAGNOSIS — E119 Type 2 diabetes mellitus without complications: Secondary | ICD-10-CM

## 2019-08-08 DIAGNOSIS — Z955 Presence of coronary angioplasty implant and graft: Secondary | ICD-10-CM

## 2019-08-08 DIAGNOSIS — E785 Hyperlipidemia, unspecified: Secondary | ICD-10-CM

## 2019-08-08 DIAGNOSIS — I2510 Atherosclerotic heart disease of native coronary artery without angina pectoris: Secondary | ICD-10-CM

## 2019-08-08 MED ORDER — CARVEDILOL 3.125 MG PO TABS: 3.1250 mg | ORAL_TABLET | Freq: Two times a day (BID) | ORAL | 1 refills | Status: DC

## 2019-08-08 MED ORDER — EZETIMIBE 10 MG PO TABS: 10.0000 mg | ORAL_TABLET | Freq: Every day | ORAL | 0 refills | Status: DC

## 2019-08-08 MED ORDER — REPATHA SURECLICK 140 MG/ML ~~LOC~~ SOAJ: 140.0000 mg | SUBCUTANEOUS | 1 refills | Status: DC

## 2019-08-08 MED FILL — EZETIMIBE 10 MG TABS: 10 | 90 days supply | Qty: 90 | Fill #0

## 2019-08-08 MED FILL — CARVEDILOL 3.125 MG TABLET: 3.125 | 90 days supply | Qty: 180 | Fill #0

## 2019-08-08 NOTE — Patient Instructions (Signed)
Please remember to bring in your medication bottles in at the next visit.   New Medications that were added at today's visit:  Carvedilol 6.25 mg p.o. twice daily Zetia 10 mg p.o. daily Repatha  Medications that were discontinued at today's visit: Atenolol  Office will call you to have the following tests scheduled:  Echocardiogram. Renal duplex. Carotid duplex Monitor  Please have a referral for sleep study per Dr. Charlott Rakes recommendation.  Recommend follow up with your PCP as scheduled.
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
## 2019-08-11 ENCOUNTER — Telehealth: Payer: Self-pay | Admitting: Pharmacist

## 2019-08-11 ENCOUNTER — Telehealth: Payer: Self-pay

## 2019-08-11 ENCOUNTER — Other Ambulatory Visit: Payer: Self-pay | Admitting: Cardiology

## 2019-08-11 DIAGNOSIS — I10 Essential (primary) hypertension: Secondary | ICD-10-CM

## 2019-08-11 MED ORDER — HYDRALAZINE HCL 25 MG PO TABS: 25.0000 mg | ORAL_TABLET | Freq: Three times a day (TID) | ORAL | 3 refills | Status: DC

## 2019-08-11 MED FILL — hydrALAZINE HCL 25 MG TABS: 25 | 90 days supply | Qty: 270 | Fill #0

## 2019-08-11 NOTE — Telephone Encounter (Signed)
This has been handled.

## 2019-08-11 NOTE — Telephone Encounter (Signed)
Pt came to the office with elevated BP readings early this morning. SBP readings ranging from 180-200s. Pt states that previously her home SBP readings ranged around 150s. BP readings today before she took her morning medications. Pt denies any complains of HA, CP, SOB, dysphagia, facial dropping. Recent switch from atenolol 50 mg daily to carvedilol 3.125 mg BID on 08/08/19. Pt reports to be adherent to her medications and denies any recent ADRs. BP in office 2 hrs post her morning medications of carvedilol 6.25 mg, amlodipine 5 mg, HCTZ 25 mg of 158/90 mmHg. Pt uses [employer] brand BP cuff at home. Recommended pt buy an [employer] brand BP cuff and to continue regularly checking her BP at home. 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 Per Dr. Emelda Brothers recommendation, pt will increase amlodipine dose to 10 mg daily and will newly start on hydralazine 25 mg TID starting today. Pt will check her BP regularly over the next few days and I will contact pt to review home BP readings early next week. Pt aware to contact the office or emergency service if pt starts developing any complains of severe CP, HA, dizziness, lightheadedness. Pt verbalized understanding and is planning on making an active effort to monitor her BP closely and making sure that she is able to walk around her backyard with her kittens to get in some exercise.

## 2019-08-15 ENCOUNTER — Other Ambulatory Visit: Payer: Self-pay

## 2019-08-15 ENCOUNTER — Ambulatory Visit: Payer: No Typology Code available for payment source

## 2019-08-15 DIAGNOSIS — R42 Dizziness and giddiness: Secondary | ICD-10-CM

## 2019-08-15 DIAGNOSIS — R001 Bradycardia, unspecified: Secondary | ICD-10-CM

## 2019-08-21 ENCOUNTER — Telehealth: Payer: Self-pay

## 2019-08-21 NOTE — Telephone Encounter (Addendum)
BP readings listed below:  Date:  BP   HR  Time 4/26  134/85   74  2:24 PM  4/25  133/90   71  4:00 PM 4/24  147/87   72  3:10 PM 4/23  134/91   72  4:00 PM 4/22  138/80   85  6:30 PM 4/20  161/103  69  6:47 AM 4/19  175/97   68  8:21 AM 4/18  138/90   73  5:20 PM 4/17  165/97   70  4:09 PM  Pt has been taking her morning medications around 5:30-10 AM, afternoon dose ~1-2 PM, evening dose ~11 PM - 12 AM. Pt reports to have cut down on her smoking from 1-2 pk/day to 6-7 cigarette/day. Pt has also made conscious effort in limiting her salt intake. Will continue to monitor and follow up.

## 2019-08-23 ENCOUNTER — Other Ambulatory Visit: Payer: Self-pay | Admitting: Orthopaedic Surgery

## 2019-08-23 MED FILL — REPATHA SURECLICK 140 MG/ML: 140 | 28 days supply | Qty: 2 | Fill #0

## 2019-08-24 ENCOUNTER — Other Ambulatory Visit: Payer: Self-pay | Admitting: Cardiology

## 2019-08-24 ENCOUNTER — Other Ambulatory Visit: Payer: Self-pay

## 2019-08-24 ENCOUNTER — Ambulatory Visit: Payer: No Typology Code available for payment source | Admitting: Cardiology

## 2019-08-24 DIAGNOSIS — E782 Mixed hyperlipidemia: Secondary | ICD-10-CM

## 2019-08-24 DIAGNOSIS — I10 Essential (primary) hypertension: Secondary | ICD-10-CM

## 2019-08-24 DIAGNOSIS — E7800 Pure hypercholesterolemia, unspecified: Secondary | ICD-10-CM

## 2019-08-24 MED ORDER — REPATHA SURECLICK 140 MG/ML ~~LOC~~ SOAJ: 140.0000 mg | SUBCUTANEOUS | 1 refills | Status: DC

## 2019-08-24 MED ORDER — EVOLOCUMAB 140 MG/ML ~~LOC~~ SOAJ
140.0000 mg | Freq: Once | SUBCUTANEOUS | Status: AC
  Administered 2019-08-24: 15:00:00 140 mg via SUBCUTANEOUS

## 2019-08-26 HISTORY — PX: ANKLE SURGERY: SHX546

## 2019-08-29 ENCOUNTER — Other Ambulatory Visit: Payer: Self-pay

## 2019-08-29 ENCOUNTER — Encounter (HOSPITAL_BASED_OUTPATIENT_CLINIC_OR_DEPARTMENT_OTHER): Payer: Self-pay | Admitting: Orthopaedic Surgery

## 2019-08-29 NOTE — Progress Notes (Signed)
I called pt back to keep her informed of what was still needed from cards  But there was no answer and her voicemail box was full.

## 2019-08-29 NOTE — Progress Notes (Signed)
I spoke withTatiyana about cardiac clearance needs for this pt prior to surgery. She conitue to follow up with these needs.

## 2019-08-29 NOTE — Progress Notes (Signed)
I have reviewed this pts chart with Dr. Hyacinth Meeker, confirming that this pt will need cardiac clearance for this surgery/procedure, after she has completed the following ordered Echo and dopplers. She has an appointment this Thursday 08/31/19 to complete these and will need to have her scheduled follow up with cards afterwards and then may precede with surgery if given the ok from her cards MD. I will call the pt and make her aware, as well as, Dr.Adair's office.

## 2019-08-30 ENCOUNTER — Encounter: Payer: Self-pay | Admitting: Acute Care

## 2019-08-30 ENCOUNTER — Ambulatory Visit (INDEPENDENT_AMBULATORY_CARE_PROVIDER_SITE_OTHER): Payer: No Typology Code available for payment source | Admitting: Acute Care

## 2019-08-30 ENCOUNTER — Ambulatory Visit (INDEPENDENT_AMBULATORY_CARE_PROVIDER_SITE_OTHER)
Admission: RE | Admit: 2019-08-30 | Discharge: 2019-08-30 | Disposition: A | Discharge status: Home / Self Care | Payer: No Typology Code available for payment source | Source: Ambulatory Visit | Attending: Acute Care | Admitting: Acute Care

## 2019-08-30 VITALS — BP 158/78 | HR 69 | Temp 97.8°F | Ht 65.5 in | Wt 225.2 lb

## 2019-08-30 DIAGNOSIS — F17210 Nicotine dependence, cigarettes, uncomplicated: Secondary | ICD-10-CM

## 2019-08-30 DIAGNOSIS — Z87891 Personal history of nicotine dependence: Secondary | ICD-10-CM | POA: Diagnosis not present

## 2019-08-30 DIAGNOSIS — Z122 Encounter for screening for malignant neoplasm of respiratory organs: Secondary | ICD-10-CM

## 2019-08-30 NOTE — Progress Notes (Signed)
Shared Decision Making Visit Lung Cancer Screening Program [phone])   Eligibility:  Age 64 y.o.  Pack Years Smoking History Calculation 42 pack year smoking history (# packs/per year x # years smoked)  Recent History of coughing up blood  no  Unexplained weight loss? no ( >Than 15 pounds within the last 6 months )  Prior History Lung / other cancer no (Diagnosis within the last 5 years already requiring surveillance chest CT Scans).  Smoking Status Current Smoker  Former Smokers: Years since quit: NA  Quit Date: NA  Visit Components:  Discussion included one or more decision making aids. yes  Discussion included risk/benefits of screening. yes  Discussion included potential follow up diagnostic testing for abnormal scans. yes  Discussion included meaning and risk of over diagnosis. yes  Discussion included meaning and risk of False Positives. yes  Discussion included meaning of total radiation exposure. yes  Counseling Included:  Importance of adherence to annual lung cancer LDCT screening. yes  Impact of comorbidities on ability to participate in the program. yes  Ability and willingness to under diagnostic treatment. yes  Smoking Cessation Counseling:  Current Smokers:   Discussed importance of smoking cessation. yes  Information about tobacco cessation classes and interventions provided to patient. yes  Patient provided with "ticket" for LDCT Scan. yes  Symptomatic Patient. no  Counseling  Diagnosis Code: Tobacco Use Z72.0  Asymptomatic Patient yes  Counseling (Intermediate counseling: > three minutes counseling) G1829  Former Smokers:   Discussed the importance of maintaining cigarette abstinence. yes  Diagnosis Code: Personal History of Nicotine Dependence. H37.169  Information about tobacco cessation classes and interventions provided to patient. Yes  Patient provided with "ticket" for LDCT Scan. yes  Written Order for Lung Cancer  Screening with LDCT placed in Epic. Yes (CT Chest Lung Cancer Screening Low Dose W/O CM) [id number] Z12.2-Screening of respiratory organs Z87.891-Personal history of nicotine dependence  I have spent 25 minutes of face to face time with Ms. Arena discussing the risks and benefits of lung cancer screening. We viewed a power point together that explained in detail the above noted topics. We paused at intervals to allow for questions to be asked and answered to ensure understanding.We discussed that the single most powerful action that she can take to decrease her risk of developing lung cancer is to quit smoking. We discussed whether or not she is ready to commit to setting a quit date. We discussed options for tools to aid in quitting smoking including nicotine replacement therapy, non-nicotine medications, support groups, Quit Smart classes, and behavior modification. We discussed that often times setting smaller, more achievable goals, such as eliminating 1 cigarette a day for a week and then 2 cigarettes a day for a week can be helpful in slowly decreasing the number of cigarettes smoked. This allows for a sense of accomplishment as well as providing a clinical benefit. I gave her the " Be Stronger Than Your Excuses" card with contact information for community resources, classes, free nicotine replacement therapy, and access to mobile apps, text messaging, and on-line smoking cessation help. I have also given her my card and contact information in the event she needs to contact me. We discussed the time and location of the scan, and that either Doroteo Glassman RN or I will call with the results within 24-48 hours of receiving them. I have offered her  a copy of the power point we viewed  as a resource in the event they need reinforcement of  the concepts we discussed today in the office. The patient verbalized understanding of all of  the above and had no further questions upon leaving the office. They have my  contact information in the event they have any further questions.  I spent 3 minutes counseling on smoking cessation and the health risks of continued tobacco abuse.  I explained to the patient that there has been a high incidence of coronary artery disease noted on these exams. I explained that this is a non-gated exam therefore degree or severity cannot be determined. This patient is currently on statin therapy. I have asked the patient to follow-up with their PCP regarding any incidental finding of coronary artery disease and management with diet or medication as their PCP  feels is clinically indicated. The patient verbalized understanding of the above and had no further questions upon completion of the visit.      Bevelyn Ngo, NP 08/30/2019 9:35 AM

## 2019-08-30 NOTE — Patient Instructions (Signed)
Thank you for participating in the [facility]. It was our pleasure to meet you today. We will call you with the results of your scan within the next few days. Your scan will be assigned a Lung RADS category score by the physicians reading the scans.  This Lung RADS score determines follow up scanning.  See below for description of categories, and follow up screening recommendations. We will be in touch to schedule your follow up screening annually or based on recommendations of our providers. We will fax a copy of your scan results to your Primary Care Physician, or the physician who referred you to the program, to ensure they have the results. Please call the office if you have any questions or concerns regarding your scanning experience or results.  Our office number is [phone]. Please speak with Denise Phelps, RN. She is our Lung Cancer Screening RN. If she is unavailable when you call, please have the office staff send her a message. She will return your call at her earliest convenience. Remember, if your scan is normal, we will scan you annually as long as you continue to meet the criteria for the program. (Age 55-77, Current smoker or smoker who has quit within the last 15 years). If you are a smoker, remember, quitting is the single most powerful action that you can take to decrease your risk of lung cancer and other pulmonary, breathing related problems. We know quitting is hard, and we are here to help.  Please let us know if there is anything we can do to help you meet your goal of quitting. If you are a former smoker, congratulations. We are proud of you! Remain smoke free! Remember you can refer friends or family members through the number above.  We will screen them to make sure they meet criteria for the program. Thank you for helping us take better care of you by participating in Lung Screening.  Lung RADS Categories:  Lung RADS 1: no nodules  or definitely non-concerning nodules.  Recommendation is for a repeat annual scan in 12 months.  Lung RADS 2:  nodules that are non-concerning in appearance and behavior with a very low likelihood of becoming an active cancer. Recommendation is for a repeat annual scan in 12 months.  Lung RADS 3: nodules that are probably non-concerning , includes nodules with a low likelihood of becoming an active cancer.  Recommendation is for a 6-month repeat screening scan. Often noted after an upper respiratory illness. We will be in touch to make sure you have no questions, and to schedule your 6-month scan.  Lung RADS 4 A: nodules with concerning findings, recommendation is most often for a follow up scan in 3 months or additional testing based on our provider's assessment of the scan. We will be in touch to make sure you have no questions and to schedule the recommended 3 month follow up scan.  Lung RADS 4 B:  indicates findings that are concerning. We will be in touch with you to schedule additional diagnostic testing based on our provider's  assessment of the scan.   

## 2019-08-31 ENCOUNTER — Encounter (HOSPITAL_BASED_OUTPATIENT_CLINIC_OR_DEPARTMENT_OTHER)
Admission: RE | Admit: 2019-08-31 | Discharge: 2019-08-31 | Disposition: A | Discharge status: Home / Self Care | Payer: No Typology Code available for payment source | Source: Ambulatory Visit | Attending: Orthopaedic Surgery | Admitting: Orthopaedic Surgery

## 2019-08-31 ENCOUNTER — Telehealth: Payer: Self-pay

## 2019-08-31 ENCOUNTER — Ambulatory Visit: Payer: No Typology Code available for payment source

## 2019-08-31 ENCOUNTER — Other Ambulatory Visit: Payer: Self-pay

## 2019-08-31 DIAGNOSIS — I2510 Atherosclerotic heart disease of native coronary artery without angina pectoris: Secondary | ICD-10-CM

## 2019-08-31 DIAGNOSIS — R42 Dizziness and giddiness: Secondary | ICD-10-CM

## 2019-08-31 DIAGNOSIS — Z01812 Encounter for preprocedural laboratory examination: Secondary | ICD-10-CM | POA: Diagnosis present

## 2019-08-31 DIAGNOSIS — I10 Essential (primary) hypertension: Secondary | ICD-10-CM

## 2019-08-31 DIAGNOSIS — R001 Bradycardia, unspecified: Secondary | ICD-10-CM

## 2019-08-31 LAB — BASIC METABOLIC PANEL
Anion gap: 7 (ref 5–15)
BUN: 9 mg/dL (ref 8–23)
CO2: 30 mmol/L (ref 22–32)
Calcium: 9.1 mg/dL (ref 8.9–10.3)
Chloride: 103 mmol/L (ref 98–111)
Creatinine, Ser: 0.57 mg/dL (ref 0.44–1.00)
GFR calc Af Amer: >60 mL/min (ref >60)
GFR calc non Af Amer: >60 mL/min (ref >60)
Glucose, Bld: 132 mg/dL — ABNORMAL HIGH (ref 70–99)
Potassium: 4.1 mmol/L (ref 3.5–5.1)
Sodium: 140 mmol/L (ref 135–145)

## 2019-08-31 NOTE — Progress Notes (Signed)
Please call patient and let them  know their  low dose Ct was read as a Lung RADS 1, negative study: no nodules or definitely benign nodules. Radiology recommendation is for a repeat LDCT in 12 months. .Please let them  know we will order and schedule their  annual screening scan for 08/2020. Please let them  know there was notation of CAD on their  scan.  Please remind the patient  that this is a non-gated exam therefore degree or severity of disease  cannot be determined. Please have them  follow up with their PCP regarding potential risk factor modification, dietary therapy or pharmacologic therapy if clinically indicated. Pt.  is  not currently on statin therapy. Please place order for annual  screening scan for  08/2020 and fax results to PCP. Thanks so much. 

## 2019-08-31 NOTE — Progress Notes (Signed)

## 2019-09-01 ENCOUNTER — Other Ambulatory Visit (HOSPITAL_COMMUNITY)
Admission: RE | Admit: 2019-09-01 | Discharge: 2019-09-01 | Disposition: A | Discharge status: Home / Self Care | Payer: No Typology Code available for payment source | Source: Ambulatory Visit | Attending: Orthopaedic Surgery | Admitting: Orthopaedic Surgery

## 2019-09-01 ENCOUNTER — Other Ambulatory Visit: Payer: Self-pay | Admitting: *Deleted

## 2019-09-01 DIAGNOSIS — F17210 Nicotine dependence, cigarettes, uncomplicated: Secondary | ICD-10-CM

## 2019-09-01 DIAGNOSIS — Z20822 Contact with and (suspected) exposure to covid-19: Secondary | ICD-10-CM | POA: Insufficient documentation

## 2019-09-01 DIAGNOSIS — Z01812 Encounter for preprocedural laboratory examination: Secondary | ICD-10-CM | POA: Insufficient documentation

## 2019-09-01 LAB — SARS CORONAVIRUS 2 (TAT 6-24 HRS): SARS Coronavirus 2: NEGATIVE

## 2019-09-06 ENCOUNTER — Telehealth: Payer: Self-pay

## 2019-09-06 ENCOUNTER — Other Ambulatory Visit: Payer: Self-pay

## 2019-09-06 ENCOUNTER — Encounter: Payer: Self-pay | Admitting: Cardiology

## 2019-09-06 ENCOUNTER — Ambulatory Visit: Payer: No Typology Code available for payment source | Admitting: Cardiology

## 2019-09-06 VITALS — BP 161/79 | HR 70 | Temp 97.2°F | Resp 17 | Ht 65.0 in | Wt 224.0 lb

## 2019-09-06 DIAGNOSIS — Z955 Presence of coronary angioplasty implant and graft: Secondary | ICD-10-CM

## 2019-09-06 DIAGNOSIS — E1169 Type 2 diabetes mellitus with other specified complication: Secondary | ICD-10-CM

## 2019-09-06 DIAGNOSIS — E782 Mixed hyperlipidemia: Secondary | ICD-10-CM

## 2019-09-06 DIAGNOSIS — I10 Essential (primary) hypertension: Secondary | ICD-10-CM

## 2019-09-06 DIAGNOSIS — Z712 Person consulting for explanation of examination or test findings: Secondary | ICD-10-CM

## 2019-09-06 DIAGNOSIS — E119 Type 2 diabetes mellitus without complications: Secondary | ICD-10-CM

## 2019-09-06 DIAGNOSIS — Z01810 Encounter for preprocedural cardiovascular examination: Secondary | ICD-10-CM

## 2019-09-06 DIAGNOSIS — Z720 Tobacco use: Secondary | ICD-10-CM

## 2019-09-06 DIAGNOSIS — I2510 Atherosclerotic heart disease of native coronary artery without angina pectoris: Secondary | ICD-10-CM

## 2019-09-06 DIAGNOSIS — E785 Hyperlipidemia, unspecified: Secondary | ICD-10-CM

## 2019-09-06 MED ORDER — ISOSORBIDE DINITRATE 20 MG PO TABS: 20.0000 mg | ORAL_TABLET | Freq: Three times a day (TID) | ORAL | 0 refills | Status: DC

## 2019-09-06 MED ORDER — AMLODIPINE BESYLATE 10 MG PO TABS: 10.0000 mg | ORAL_TABLET | Freq: Every day | ORAL | 1 refills | Status: DC
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
## 2019-09-06 NOTE — Progress Notes (Signed)
Action Time Recorded Time Documented By Site Comment Reason Patient Supplied  Given : 140 mg :  : Subcutaneous 08/24/19 1446 08/24/19 1451 Mares, Lesley Right Anterior Thigh

## 2019-09-06 NOTE — Patient Instructions (Signed)
Fasting lipid profile on or after October 09, 2019.  Follow-up visit on October 16, 2019.  Take the losartan in the evening.  Isordil 20 mg p.o. 3 times daily (new medication added during today's visit and sent to the pharmacy)  Norvasc refilled.

## 2019-09-08 ENCOUNTER — Other Ambulatory Visit (HOSPITAL_COMMUNITY)
Admission: RE | Admit: 2019-09-08 | Discharge: 2019-09-08 | Disposition: A | Discharge status: Home / Self Care | Payer: No Typology Code available for payment source | Source: Ambulatory Visit | Attending: Orthopaedic Surgery | Admitting: Orthopaedic Surgery

## 2019-09-08 DIAGNOSIS — Z01812 Encounter for preprocedural laboratory examination: Secondary | ICD-10-CM | POA: Insufficient documentation

## 2019-09-08 DIAGNOSIS — Z20822 Contact with and (suspected) exposure to covid-19: Secondary | ICD-10-CM | POA: Insufficient documentation

## 2019-09-08 LAB — SARS CORONAVIRUS 2 (TAT 6-24 HRS): SARS Coronavirus 2: NEGATIVE

## 2019-09-08 NOTE — Telephone Encounter (Signed)
Please send them over.

## 2019-09-12 ENCOUNTER — Ambulatory Visit (HOSPITAL_BASED_OUTPATIENT_CLINIC_OR_DEPARTMENT_OTHER): Payer: No Typology Code available for payment source | Admitting: Anesthesiology

## 2019-09-12 ENCOUNTER — Ambulatory Visit (HOSPITAL_BASED_OUTPATIENT_CLINIC_OR_DEPARTMENT_OTHER)
Admission: RE | Admit: 2019-09-12 | Discharge: 2019-09-12 | Disposition: A | Discharge status: Home / Self Care | Payer: No Typology Code available for payment source | Attending: Orthopaedic Surgery | Admitting: Orthopaedic Surgery

## 2019-09-12 ENCOUNTER — Encounter (HOSPITAL_BASED_OUTPATIENT_CLINIC_OR_DEPARTMENT_OTHER)
Admission: RE | Disposition: A | Discharge status: Home / Self Care | Payer: Self-pay | Source: Home / Self Care | Attending: Orthopaedic Surgery

## 2019-09-12 ENCOUNTER — Encounter (HOSPITAL_BASED_OUTPATIENT_CLINIC_OR_DEPARTMENT_OTHER): Payer: Self-pay | Admitting: Orthopaedic Surgery

## 2019-09-12 ENCOUNTER — Other Ambulatory Visit: Payer: Self-pay

## 2019-09-12 DIAGNOSIS — Z7982 Long term (current) use of aspirin: Secondary | ICD-10-CM | POA: Diagnosis not present

## 2019-09-12 DIAGNOSIS — Z888 Allergy status to other drugs, medicaments and biological substances status: Secondary | ICD-10-CM | POA: Diagnosis not present

## 2019-09-12 DIAGNOSIS — I509 Heart failure, unspecified: Secondary | ICD-10-CM | POA: Diagnosis not present

## 2019-09-12 DIAGNOSIS — E785 Hyperlipidemia, unspecified: Secondary | ICD-10-CM | POA: Insufficient documentation

## 2019-09-12 DIAGNOSIS — T8484XA Pain due to internal orthopedic prosthetic devices, implants and grafts, initial encounter: Secondary | ICD-10-CM | POA: Diagnosis not present

## 2019-09-12 DIAGNOSIS — E669 Obesity, unspecified: Secondary | ICD-10-CM | POA: Insufficient documentation

## 2019-09-12 DIAGNOSIS — I2510 Atherosclerotic heart disease of native coronary artery without angina pectoris: Secondary | ICD-10-CM | POA: Insufficient documentation

## 2019-09-12 DIAGNOSIS — Z881 Allergy status to other antibiotic agents status: Secondary | ICD-10-CM | POA: Diagnosis not present

## 2019-09-12 DIAGNOSIS — Y831 Surgical operation with implant of artificial internal device as the cause of abnormal reaction of the patient, or of later complication, without mention of misadventure at the time of the procedure: Secondary | ICD-10-CM | POA: Insufficient documentation

## 2019-09-12 DIAGNOSIS — Z9884 Bariatric surgery status: Secondary | ICD-10-CM | POA: Insufficient documentation

## 2019-09-12 DIAGNOSIS — Z6837 Body mass index (BMI) 37.0-37.9, adult: Secondary | ICD-10-CM | POA: Diagnosis not present

## 2019-09-12 DIAGNOSIS — Z7984 Long term (current) use of oral hypoglycemic drugs: Secondary | ICD-10-CM | POA: Insufficient documentation

## 2019-09-12 DIAGNOSIS — Z955 Presence of coronary angioplasty implant and graft: Secondary | ICD-10-CM | POA: Insufficient documentation

## 2019-09-12 DIAGNOSIS — M899 Disorder of bone, unspecified: Secondary | ICD-10-CM | POA: Insufficient documentation

## 2019-09-12 DIAGNOSIS — I110 Hypertensive heart disease with heart failure: Secondary | ICD-10-CM | POA: Insufficient documentation

## 2019-09-12 DIAGNOSIS — E119 Type 2 diabetes mellitus without complications: Secondary | ICD-10-CM | POA: Diagnosis not present

## 2019-09-12 DIAGNOSIS — J449 Chronic obstructive pulmonary disease, unspecified: Secondary | ICD-10-CM | POA: Diagnosis not present

## 2019-09-12 DIAGNOSIS — Z882 Allergy status to sulfonamides status: Secondary | ICD-10-CM | POA: Diagnosis not present

## 2019-09-12 DIAGNOSIS — T849XXA Unspecified complication of internal orthopedic prosthetic device, implant and graft, initial encounter: Secondary | ICD-10-CM | POA: Diagnosis present

## 2019-09-12 DIAGNOSIS — Z79899 Other long term (current) drug therapy: Secondary | ICD-10-CM | POA: Diagnosis not present

## 2019-09-12 DIAGNOSIS — F17210 Nicotine dependence, cigarettes, uncomplicated: Secondary | ICD-10-CM | POA: Insufficient documentation

## 2019-09-12 HISTORY — PX: HARDWARE REMOVAL: SHX979

## 2019-09-12 LAB — GLUCOSE, CAPILLARY
Glucose-Capillary: 158 mg/dL — ABNORMAL HIGH (ref 70–99)
Glucose-Capillary: 124 mg/dL — ABNORMAL HIGH (ref 70–99)

## 2019-09-12 SURGERY — REMOVAL, HARDWARE
Anesthesia: General | Site: Ankle | Laterality: Right

## 2019-09-12 MED ORDER — LACTATED RINGERS IV SOLN: INTRAVENOUS | Status: DC

## 2019-09-12 MED ORDER — EPHEDRINE 5 MG/ML INJ
INTRAVENOUS | Status: AC
  Filled 2019-09-12: qty 10

## 2019-09-12 MED ORDER — LIDOCAINE HCL (CARDIAC) PF 100 MG/5ML IV SOSY
PREFILLED_SYRINGE | INTRAVENOUS | Status: DC | PRN
  Administered 2019-09-12: 100 mg via INTRAVENOUS

## 2019-09-12 MED ORDER — OXYCODONE HCL 5 MG PO TABS: 5.0000 mg | ORAL_TABLET | ORAL | 0 refills | Status: AC | PRN

## 2019-09-12 MED ORDER — PROPOFOL 10 MG/ML IV BOLUS
INTRAVENOUS | Status: AC
  Filled 2019-09-12: qty 20

## 2019-09-12 MED ORDER — BUPIVACAINE HCL 0.5 % IJ SOLN
INTRAMUSCULAR | Status: DC | PRN
  Administered 2019-09-12: 20 mL

## 2019-09-12 MED ORDER — MIDAZOLAM HCL 2 MG/2ML IJ SOLN
1.0000 mg | INTRAMUSCULAR | Status: DC | PRN
  Administered 2019-09-12: 2 mg via INTRAVENOUS

## 2019-09-12 MED ORDER — PROPOFOL 10 MG/ML IV BOLUS
INTRAVENOUS | Status: DC | PRN
  Administered 2019-09-12: 200 mg via INTRAVENOUS

## 2019-09-12 MED ORDER — ONDANSETRON HCL 4 MG/2ML IJ SOLN
INTRAMUSCULAR | Status: AC
  Filled 2019-09-12: qty 2

## 2019-09-12 MED ORDER — ONDANSETRON HCL 4 MG/2ML IJ SOLN
INTRAMUSCULAR | Status: DC | PRN
  Administered 2019-09-12: 4 mg via INTRAVENOUS

## 2019-09-12 MED ORDER — CEFAZOLIN SODIUM-DEXTROSE 2-4 GM/100ML-% IV SOLN
2.0000 g | INTRAVENOUS | Status: AC
  Administered 2019-09-12: 2 g via INTRAVENOUS

## 2019-09-12 MED ORDER — EPHEDRINE SULFATE 50 MG/ML IJ SOLN
INTRAMUSCULAR | Status: DC | PRN
  Administered 2019-09-12: 10 mg via INTRAVENOUS

## 2019-09-12 MED ORDER — MIDAZOLAM HCL 2 MG/2ML IJ SOLN
INTRAMUSCULAR | Status: AC
  Filled 2019-09-12: qty 2

## 2019-09-12 MED ORDER — CEFAZOLIN SODIUM-DEXTROSE 2-4 GM/100ML-% IV SOLN
INTRAVENOUS | Status: AC
  Filled 2019-09-12: qty 100

## 2019-09-12 MED ORDER — LIDOCAINE 2% (20 MG/ML) 5 ML SYRINGE
INTRAMUSCULAR | Status: AC
  Filled 2019-09-12: qty 5

## 2019-09-12 MED ORDER — FENTANYL CITRATE (PF) 100 MCG/2ML IJ SOLN
50.0000 ug | INTRAMUSCULAR | Status: DC | PRN
  Administered 2019-09-12: 100 ug via INTRAVENOUS

## 2019-09-12 MED ORDER — FENTANYL CITRATE (PF) 100 MCG/2ML IJ SOLN
INTRAMUSCULAR | Status: AC
  Filled 2019-09-12: qty 2

## 2019-09-12 MED FILL — oxyCODONE HCL 5 MG TABS: 5 | 7 days supply | Qty: 20 | Fill #0

## 2019-09-12 SURGICAL SUPPLY — 65 items
BANDAGE ESMARK 6X9 LF (GAUZE/BANDAGES/DRESSINGS) IMPLANT
BENZOIN TINCTURE PRP APPL 2/3 (GAUZE/BANDAGES/DRESSINGS) IMPLANT
BLADE SURG 15 STRL LF DISP TIS (BLADE) ×4 IMPLANT
BLADE SURG 15 STRL SS (BLADE) ×8
BNDG ELASTIC 4X5.8 VLCR STR LF (GAUZE/BANDAGES/DRESSINGS) ×2 IMPLANT
BNDG ELASTIC 6X5.8 VLCR STR LF (GAUZE/BANDAGES/DRESSINGS) IMPLANT
BNDG ESMARK 4X9 LF (GAUZE/BANDAGES/DRESSINGS) IMPLANT
BNDG ESMARK 6X9 LF (GAUZE/BANDAGES/DRESSINGS)
BOOT STEPPER DURA LG (SOFTGOODS) ×2 IMPLANT
CHLORAPREP W/TINT 26 (MISCELLANEOUS) ×2 IMPLANT
COVER BACK TABLE 60X90IN (DRAPES) ×2 IMPLANT
COVER WAND RF STERILE (DRAPES) IMPLANT
CUFF TOURN SGL QUICK 34 (TOURNIQUET CUFF)
CUFF TRNQT CYL 34X4.125X (TOURNIQUET CUFF) IMPLANT
DECANTER SPIKE VIAL GLASS SM (MISCELLANEOUS) IMPLANT
DRAPE C-ARM 42X72 X-RAY (DRAPES) IMPLANT
DRAPE EXTREMITY T 121X128X90 (DISPOSABLE) ×2 IMPLANT
DRAPE IMP U-DRAPE 54X76 (DRAPES) ×2 IMPLANT
DRAPE OEC MINIVIEW 54X84 (DRAPES) IMPLANT
DRAPE U-SHAPE 47X51 STRL (DRAPES) ×2 IMPLANT
DRSG PAD ABDOMINAL 8X10 ST (GAUZE/BANDAGES/DRESSINGS) ×2 IMPLANT
ELECT REM PT RETURN 9FT ADLT (ELECTROSURGICAL)
ELECTRODE REM PT RTRN 9FT ADLT (ELECTROSURGICAL) IMPLANT
GAUZE SPONGE 4X4 12PLY STRL (GAUZE/BANDAGES/DRESSINGS) ×2 IMPLANT
GAUZE XEROFORM 1X8 LF (GAUZE/BANDAGES/DRESSINGS) ×2 IMPLANT
GLOVE BIOGEL M STRL SZ7.5 (GLOVE) ×2 IMPLANT
GLOVE BIOGEL PI IND STRL 6.5 (GLOVE) ×1 IMPLANT
GLOVE BIOGEL PI IND STRL 7.0 (GLOVE) ×2 IMPLANT
GLOVE BIOGEL PI IND STRL 8 (GLOVE) ×1 IMPLANT
GLOVE BIOGEL PI INDICATOR 6.5 (GLOVE) ×1
GLOVE BIOGEL PI INDICATOR 7.0 (GLOVE) ×2
GLOVE BIOGEL PI INDICATOR 8 (GLOVE) ×1
GLOVE ECLIPSE 6.5 STRL STRAW (GLOVE) ×4 IMPLANT
GOWN STRL REUS W/ TWL LRG LVL3 (GOWN DISPOSABLE) ×2 IMPLANT
GOWN STRL REUS W/ TWL XL LVL3 (GOWN DISPOSABLE) ×1 IMPLANT
GOWN STRL REUS W/TWL LRG LVL3 (GOWN DISPOSABLE) ×4
GOWN STRL REUS W/TWL XL LVL3 (GOWN DISPOSABLE) ×2
NEEDLE HYPO 25X1 1.5 SAFETY (NEEDLE) ×2 IMPLANT
NS IRRIG 1000ML POUR BTL (IV SOLUTION) ×2 IMPLANT
PAD CAST 4YDX4 CTTN HI CHSV (CAST SUPPLIES) ×1 IMPLANT
PADDING CAST COTTON 4X4 STRL (CAST SUPPLIES) ×2
PADDING CAST SYNTHETIC 4 (CAST SUPPLIES)
PADDING CAST SYNTHETIC 4X4 STR (CAST SUPPLIES) IMPLANT
PENCIL SMOKE EVACUATOR (MISCELLANEOUS) ×2 IMPLANT
SET BASIN DAY SURGERY F.S. (CUSTOM PROCEDURE TRAY) ×2 IMPLANT
SHEET MEDIUM DRAPE 40X70 STRL (DRAPES) ×2 IMPLANT
SLEEVE SCD COMPRESS KNEE MED (MISCELLANEOUS) IMPLANT
SPLINT FAST PLASTER 5X30 (CAST SUPPLIES)
SPLINT PLASTER CAST FAST 5X30 (CAST SUPPLIES) IMPLANT
SPONGE LAP 18X18 RF (DISPOSABLE) ×2 IMPLANT
STAPLER VISISTAT 35W (STAPLE) ×2 IMPLANT
STOCKINETTE 6  STRL (DRAPES) ×2
STOCKINETTE 6 STRL (DRAPES) ×1 IMPLANT
STRIP CLOSURE SKIN 1/2X4 (GAUZE/BANDAGES/DRESSINGS) IMPLANT
SUCTION FRAZIER HANDLE 10FR (MISCELLANEOUS)
SUCTION TUBE FRAZIER 10FR DISP (MISCELLANEOUS) IMPLANT
SUT ETHILON 3 0 PS 1 (SUTURE) IMPLANT
SUT MNCRL AB 3-0 PS2 18 (SUTURE) ×2 IMPLANT
SUT PDS AB 2-0 CT2 27 (SUTURE) ×2 IMPLANT
SUT VIC AB 3-0 FS2 27 (SUTURE) IMPLANT
SYR BULB EAR ULCER 3OZ GRN STR (SYRINGE) ×2 IMPLANT
SYR CONTROL 10ML LL (SYRINGE) ×2 IMPLANT
TOWEL GREEN STERILE FF (TOWEL DISPOSABLE) ×6 IMPLANT
TUBE CONNECTING 20X1/4 (TUBING) IMPLANT
UNDERPAD 30X36 HEAVY ABSORB (UNDERPADS AND DIAPERS) ×2 IMPLANT
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
## 2019-09-12 NOTE — Discharge Instructions (Signed)
DR. Lucia Gaskins FOOT & ANKLE SURGERY POST-OP INSTRUCTIONS   Pain Management 1. The numbing medicine and your leg will last around 5 hours, take a dose of your pain medicine as soon as you feel it wearing off to avoid rebound pain. 2. Keep your foot elevated above heart level.  Make sure that your heel hangs free ('floats'). 3. Take all prescribed medication as directed. 4. If taking narcotic pain medication you may want to use an over-the-counter stool softener to avoid constipation. 5. You may take over-the-counter NSAIDs (ibuprofen, naproxen, etc.) as well as over-the-counter acetaminophen as directed on the packaging as a supplement for your pain and may also use it to wean away from the prescription medication.  Activity ? Activity as tolerated. ? Keep dressing in place  First Postoperative Visit 1. Your first postop visit will be at least 2 weeks after surgery.  This should be scheduled when you schedule surgery. 2. If you do not have a postoperative visit scheduled please call [phone] to schedule an appointment. 3. At the appointment your incision will be evaluated for suture removal, x-rays will be obtained if necessary.  General Instructions 1. Swelling is very common after foot and ankle surgery.  It often takes 3 months for the foot and ankle to begin to feel comfortable.  Some amount of swelling will persist for 6-12 months. 2. DO NOT change the dressing.  If there is a problem with the dressing (too tight, loose, gets wet, etc.) please contact Dr. Pollie Friar office. 3. DO NOT get the dressing wet.  For showers you can use an over-the-counter cast cover or wrap a washcloth around the top of your dressing and then cover it with a plastic bag and tape it to your leg. 4. DO NOT soak the incision (no tubs, pools, bath, etc.) until you have approval from Dr. Lucia Gaskins.  Contact Dr. Huel Cote office or go to Emergency Room if: 1. Temperature above 101 F. 2. Increasing pain that is unresponsive  to pain medication or elevation 3. Excessive redness or swelling in your foot 4. Dressing problems - excessive bloody drainage, looseness or tightness, or if dressing gets wet 5. Develop pain, swelling, warmth, or discoloration of your calf    Post Anesthesia Home Care Instructions  Activity: Get plenty of rest for the remainder of the day. A responsible individual must stay with you for 24 hours following the procedure.  For the next 24 hours, DO NOT: -Drive a car -Paediatric nurse -Drink alcoholic beverages -Take any medication unless instructed by your physician -Make any legal decisions or sign important papers.  Meals: Start with liquid foods such as gelatin or soup. Progress to regular foods as tolerated. Avoid greasy, spicy, heavy foods. If nausea and/or vomiting occur, drink only clear liquids until the nausea and/or vomiting subsides. Call your physician if vomiting continues.  Special Instructions/Symptoms: Your throat may feel dry or sore from the anesthesia or the breathing tube placed in your throat during surgery. If this causes discomfort, gargle with warm salt water. The discomfort should disappear within 24 hours.  If you had a scopolamine patch placed behind your ear for the management of post- operative nausea and/or vomiting:  1. The medication in the patch is effective for 72 hours, after which it should be removed.  Wrap patch in a tissue and discard in the trash. Wash hands thoroughly with soap and water. 2. You may remove the patch earlier than 72 hours if you experience unpleasant side effects which may  include dry mouth, dizziness or visual disturbances. 3. Avoid touching the patch. Wash your hands with soap and water after contact with the patch.    Call your surgeon if you experience:   1.  Fever over 101.0. 2.  Inability to urinate. 3.  Nausea and/or vomiting. 4.  Extreme swelling or bruising at the surgical site. 5.  Continued bleeding from the  incision. 6.  Increased pain, redness or drainage from the incision. 7.  Problems related to your pain medication. 8.  Any problems and/or concerns

## 2019-09-12 NOTE — Op Note (Signed)
  Karina Oneal female 64 y.o. 09/12/2019  PreOperative Diagnosis: Symptomatic orthopedic hardware, deep right ankle  PostOperative Diagnosis: Symptomatic orthopedic hardware, deep right ankle Distal fibular exostosis and heterotopic ossification  PROCEDURE: Deep hardware removal right ankle Partial resection of right distal fibula  SURGEON: Dub Mikes, MD  ASSISTANT: None  ANESTHESIA: General LMA with local infiltration of anesthetic, half percent Marcaine  FINDINGS: Retained orthopedic implant to right distal fibula  IMPLANTS: None  INDICATIONS:63 y.o. female underwent operative fixation of her right ankle fracture about 13 years ago.  She had symptoms with regard to her hardware as it was prominent and she had some bony overgrowth.  She asked to have it removed.  She understood the risk, benefits alternatives of surgery which include but not limited to wound healing complications, infection,need for further surgery damage to structures.  After weighing these risks he opted to proceed with surgery  PROCEDURE: Patient was identified in the preoperative holding area.  The right leg was marked myself.  Consent was signed myself and the patient.  She was taken the operative suite laid supine the operative table.  Bump was placed on the right hip.  LMA anesthesia was induced without difficulty.  All bony prominences were all padded.  Right lower extremity was prepped and draped in usual sterile fashion.  Surgical timeout was performed.  We began by making a longitudinal incision overlying her previous surgical incision.  This taken sharply down through skin and subcutaneous tissue.  Blunt dissection was used to identify branches of the superficial peroneal nerve which were not identified within the surgical field.  Dissection was carried down sharply to the plate.  Periosteal elevator and Therapist, nutritional was used to dissect the soft tissue off the plate.  The screws were removed.   There is 8 screws total.  The removed without difficulty.  Then the plate was removed.  There was some bony overgrowth distally that extended from the tip of the fibula proximally about 4 cm.  That was removed using a rondure and a periosteal elevator to allow for more normal contour to the distal fibula where the exostosis had been prominent.  Once the plate was removed care was taken to smooth out the distal aspect of the fibula near the site of the bony overgrowth.  There is significant mount of exostosis that was removed with a rondure and smoothed appropriately.  This provided debulking of this area to allow for adequate soft tissue closure.  Then the wound was irrigated copiously with normal saline.  Wound was closed in a layered fashion with 3-0 Monocryl suture and staples.  She was placed in a soft dressing.  She was awakened from anesthesia and taken recovery in stable condition.  There were no complications.  POST OPERATIVE INSTRUCTIONS: Weightbearing as tolerated right lower extremity Keep dressing in place Follow-up in 2 weeks for wound check and suture removal if appropriate No x-rays needed unless significant amount of pain.  TOURNIQUET TIME: No tourniquet was used  BLOOD LOSS:  less than 50 mL         DRAINS: none         SPECIMEN: none       COMPLICATIONS:  * No complications entered in OR log *         Disposition: PACU - hemodynamically stable.         Condition: stable

## 2019-09-12 NOTE — Anesthesia Procedure Notes (Signed)
Procedure Name: LMA Insertion Date/Time: 09/12/2019 9:44 AM Performed by: Cleda Clarks, CRNA Pre-anesthesia Checklist: Patient identified, Emergency Drugs available, Suction available and Patient being monitored Patient Re-evaluated:Patient Re-evaluated prior to induction Oxygen Delivery Method: Circle system utilized Preoxygenation: Pre-oxygenation with 100% oxygen Induction Type: IV induction Ventilation: Mask ventilation without difficulty LMA: LMA inserted LMA Size: 4.0 Number of attempts: 1 Airway Equipment and Method: Bite block Placement Confirmation: positive ETCO2 Tube secured with: Tape Dental Injury: Teeth and Oropharynx as per pre-operative assessment

## 2019-09-12 NOTE — Transfer of Care (Signed)
Immediate Anesthesia Transfer of Care Note  Patient: Karina Oneal  Procedure(s) Performed: RIGHT ANKLE DEEP HARDWARE REMOVAL (Right Ankle)  Patient Location: PACU  Anesthesia Type:General  Level of Consciousness: awake, alert  and oriented  Airway & Oxygen Therapy: Patient Spontanous Breathing and Patient connected to nasal cannula oxygen  Post-op Assessment: Report given to RN and Post -op Vital signs reviewed and stable  Post vital signs: Reviewed and stable  Last Vitals:  Vitals Value Taken Time  BP 159/72 09/12/19 1021  Temp    Pulse 82 09/12/19 1022  Resp 14 09/12/19 1022  SpO2 99 % 09/12/19 1022  Vitals shown include unvalidated device data.  Last Pain:  Vitals:   09/12/19 0716  TempSrc: Tympanic  PainSc: 0-No pain         Complications: No apparent anesthesia complications

## 2019-09-12 NOTE — Anesthesia Preprocedure Evaluation (Signed)
Anesthesia Evaluation  Patient identified by MRN, date of birth, ID band Patient awake    Reviewed: Allergy & Precautions, NPO status   Airway Mallampati: II  TM Distance: >3 FB     Dental   Pulmonary Current Smoker and Patient abstained from smoking.,    breath sounds clear to auscultation       Cardiovascular hypertension, + angina + CAD and +CHF  + Valvular Problems/Murmurs  Rhythm:Regular Rate:Normal     Neuro/Psych    GI/Hepatic Neg liver ROS, GERD  ,  Endo/Other  diabetes  Renal/GU negative Renal ROS     Musculoskeletal   Abdominal   Peds  Hematology   Anesthesia Other Findings   Reproductive/Obstetrics                             Anesthesia Physical Anesthesia Plan  ASA: III  Anesthesia Plan: General   Post-op Pain Management:    Induction: Intravenous  PONV Risk Score and Plan: 3 and Ondansetron, Dexamethasone and Midazolam  Airway Management Planned: LMA and Oral ETT  Additional Equipment:   Intra-op Plan:   Post-operative Plan: Extubation in OR  Informed Consent: I have reviewed the patients History and Physical, chart, labs and discussed the procedure including the risks, benefits and alternatives for the proposed anesthesia with the patient or authorized representative who has indicated his/her understanding and acceptance.     Dental advisory given  Plan Discussed with: CRNA and Anesthesiologist  Anesthesia Plan Comments:         Anesthesia Quick Evaluation

## 2019-09-12 NOTE — Anesthesia Postprocedure Evaluation (Signed)
Anesthesia Post Note  Patient: Karina Oneal  Procedure(s) Performed: RIGHT ANKLE DEEP HARDWARE REMOVAL (Right Ankle)     Patient location during evaluation: PACU Anesthesia Type: General Level of consciousness: awake Pain management: pain level controlled Vital Signs Assessment: post-procedure vital signs reviewed and stable Respiratory status: spontaneous breathing Cardiovascular status: stable Postop Assessment: no apparent nausea or vomiting Anesthetic complications: no    Last Vitals:  Vitals:   09/12/19 1030 09/12/19 1045  BP: (!) 148/67 (!) 147/66  Pulse: 76 73  Resp: 14 13  Temp:    SpO2: 99% 100%    Last Pain:  Vitals:   09/12/19 1025  TempSrc:   PainSc: 0-No pain                 [name]

## 2019-09-13 ENCOUNTER — Encounter: Payer: Self-pay | Admitting: *Deleted

## 2019-09-18 ENCOUNTER — Ambulatory Visit: Payer: No Typology Code available for payment source | Admitting: Cardiology

## 2019-09-18 MED FILL — REPATHA SURECLICK 140 MG/ML: 140 | 28 days supply | Qty: 2 | Fill #1

## 2019-10-02 NOTE — Telephone Encounter (Addendum)
Called pt for RPM f/u. BP readings reviewed. Average BP readings over the past week of ~151/90, HR~85. BP ranging from 138-160/83-100. Reviewed and updated med list together. Pt reports to be doing well overall and has no issues or complains of note. Reports to be compliant with her medications and with no ADRs of note.Pt continues to smoke 1/2 pack of cigarettes/day. Pt currently is not interested in setting a quit date. Encouraged pt to consider smoking cessation or reduce her cigarette intake. Pt not interested at the moment, but will reach out if she needs further smoking cessation assistance when she is ready. Current antihypertensive medications include carvedilol 3.125 mg BID, hydralazine 25 mg BID, HCTZ 25 mg, isordil 20 mg TID, losartan 100 mg daily. May consider titrating carvedilol or HCTZ dose for added BP control. Will continue to monitor and follow up closely. Next OV scheduled for 10/18/19.

## 2019-10-03 NOTE — Telephone Encounter (Signed)
Called and reviewed with pt. Will increase carvedilol dose to 6.25 mg BID. Pt verbalized understanding. Will continue to remotely monitor BP readings and follow up as needed.

## 2019-10-03 NOTE — Telephone Encounter (Signed)
May increase coreg to 6.25mg  po bid.

## 2019-10-03 NOTE — Telephone Encounter (Signed)
Completed.

## 2019-10-09 ENCOUNTER — Other Ambulatory Visit: Payer: Self-pay | Admitting: Cardiology

## 2019-10-09 ENCOUNTER — Other Ambulatory Visit: Payer: Self-pay | Admitting: Pharmacist

## 2019-10-09 DIAGNOSIS — I10 Essential (primary) hypertension: Secondary | ICD-10-CM

## 2019-10-09 MED ORDER — CARVEDILOL 6.25 MG PO TABS: 6.2500 mg | ORAL_TABLET | Freq: Two times a day (BID) | ORAL | 2 refills | Status: DC

## 2019-10-09 MED FILL — ISOSORBIDE DN 20 MG TABLET: 20 | 30 days supply | Qty: 90 | Fill #0

## 2019-10-09 MED FILL — buPROPion HCL ER (XL) 150 M: 150 | 90 days supply | Qty: 90 | Fill #1

## 2019-10-09 MED FILL — CARVEDILOL 6.25 MG TABLET: 6.25 | 45 days supply | Qty: 90 | Fill #0

## 2019-10-09 MED FILL — PIOGLITAZONE HCL 15 MG TAB: 15 | 90 days supply | Qty: 90 | Fill #1

## 2019-10-09 MED FILL — METFORMIN HCL ER 500 MG TB2: 500 | 90 days supply | Qty: 180 | Fill #0

## 2019-10-09 MED FILL — traZODone HCL 100 MG TABS: 100 | 90 days supply | Qty: 180 | Fill #1

## 2019-10-10 LAB — LIPID PANEL WITH LDL/HDL RATIO
Cholesterol, Total: 115 mg/dL (ref 100–199)
HDL: 52 mg/dL (ref >39)
LDL Chol Calc (NIH): 42 mg/dL (ref 0–99)
LDL/HDL Ratio: 0.8 ratio (ref 0.0–3.2)
Triglycerides: 115 mg/dL (ref 0–149)
VLDL Cholesterol Cal: 21 mg/dL (ref 5–40)

## 2019-10-16 MED FILL — REPATHA SURECLICK 140 MG/ML: 140 | 28 days supply | Qty: 2 | Fill #2

## 2019-10-18 ENCOUNTER — Other Ambulatory Visit: Payer: Self-pay

## 2019-10-18 ENCOUNTER — Encounter: Payer: Self-pay | Admitting: Cardiology

## 2019-10-18 ENCOUNTER — Ambulatory Visit: Payer: No Typology Code available for payment source | Admitting: Cardiology

## 2019-10-18 VITALS — BP 132/64 | HR 76 | Resp 17 | Ht 65.0 in | Wt 227.4 lb

## 2019-10-18 DIAGNOSIS — E7800 Pure hypercholesterolemia, unspecified: Secondary | ICD-10-CM

## 2019-10-18 DIAGNOSIS — E785 Hyperlipidemia, unspecified: Secondary | ICD-10-CM

## 2019-10-18 DIAGNOSIS — I10 Essential (primary) hypertension: Secondary | ICD-10-CM

## 2019-10-18 DIAGNOSIS — E1169 Type 2 diabetes mellitus with other specified complication: Secondary | ICD-10-CM

## 2019-10-18 DIAGNOSIS — I2510 Atherosclerotic heart disease of native coronary artery without angina pectoris: Secondary | ICD-10-CM

## 2019-10-18 DIAGNOSIS — Z955 Presence of coronary angioplasty implant and graft: Secondary | ICD-10-CM

## 2019-10-18 DIAGNOSIS — E119 Type 2 diabetes mellitus without complications: Secondary | ICD-10-CM

## 2019-10-18 DIAGNOSIS — Z720 Tobacco use: Secondary | ICD-10-CM

## 2019-10-18 MED ORDER — CARVEDILOL 12.5 MG PO TABS: 12.5000 mg | ORAL_TABLET | Freq: Two times a day (BID) | ORAL | 0 refills | Status: DC
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
## 2019-10-31 ENCOUNTER — Other Ambulatory Visit: Payer: Self-pay | Admitting: Cardiology

## 2019-10-31 DIAGNOSIS — I10 Essential (primary) hypertension: Secondary | ICD-10-CM

## 2019-10-31 DIAGNOSIS — E782 Mixed hyperlipidemia: Secondary | ICD-10-CM

## 2019-10-31 MED FILL — HYDROCHLOROTHIAZIDE 25 MG T: 25 | 90 days supply | Qty: 90 | Fill #1

## 2019-10-31 MED FILL — hydrALAZINE HCL 25 MG TABS: 25 | 90 days supply | Qty: 270 | Fill #1

## 2019-10-31 MED FILL — LOSARTAN POTASSIUM 100 MG T: 100 | 90 days supply | Qty: 90 | Fill #1

## 2019-11-01 MED FILL — EZETIMIBE 10 MG TABS: 10 | 90 days supply | Qty: 90 | Fill #0

## 2019-11-01 MED FILL — CARVEDILOL 12.5 MG TABLET: 12.5 | 90 days supply | Qty: 180 | Fill #0

## 2019-11-02 MED FILL — ISOSORBIDE DN 20 MG TABLET: 20 | 30 days supply | Qty: 90 | Fill #0

## 2019-11-14 MED FILL — REPATHA SURECLICK 140 MG/ML: 140 | 28 days supply | Qty: 2 | Fill #3

## 2019-12-07 ENCOUNTER — Other Ambulatory Visit: Payer: Self-pay | Admitting: Cardiology

## 2019-12-07 DIAGNOSIS — I10 Essential (primary) hypertension: Secondary | ICD-10-CM

## 2019-12-07 MED FILL — REPATHA SURECLICK 140 MG/ML: 140 | 28 days supply | Qty: 2 | Fill #0

## 2019-12-08 MED FILL — ISOSORBIDE DN 20 MG TABLET: 20 | 30 days supply | Qty: 90 | Fill #0

## 2019-12-28 MED FILL — buPROPion HCL ER (XL) 150 M: 150 | 90 days supply | Qty: 90 | Fill #0

## 2019-12-28 MED FILL — SERTRALINE HCL 100 MG TAB: 100 | 90 days supply | Qty: 90 | Fill #1

## 2019-12-28 MED FILL — METFORMIN HCL ER 500 MG TB2: 500 | 90 days supply | Qty: 180 | Fill #1

## 2019-12-28 MED FILL — PIOGLITAZONE HCL 15 MG TAB: 15 | 90 days supply | Qty: 90 | Fill #0

## 2019-12-28 MED FILL — AMLODIPINE BESYLATE 10 MG T: 10 | 90 days supply | Qty: 90 | Fill #1

## 2020-01-02 MED FILL — traZODone HCL 100 MG TABS: 100 | 90 days supply | Qty: 180 | Fill #2

## 2020-01-09 ENCOUNTER — Other Ambulatory Visit: Payer: Self-pay | Admitting: Cardiology

## 2020-01-09 DIAGNOSIS — I10 Essential (primary) hypertension: Secondary | ICD-10-CM

## 2020-01-09 MED FILL — REPATHA SURECLICK 140 MG/ML: 140 | 28 days supply | Qty: 2 | Fill #1

## 2020-01-09 MED FILL — ISOSORBIDE DN 20 MG TABLET: 20 | 30 days supply | Qty: 90 | Fill #0

## 2020-01-18 ENCOUNTER — Ambulatory Visit: Payer: No Typology Code available for payment source | Admitting: Cardiology

## 2020-01-23 ENCOUNTER — Ambulatory Visit: Payer: No Typology Code available for payment source | Admitting: Cardiology

## 2020-01-23 MED FILL — FLUARIX QUADRIVALENT 0.5 ML: 0.5 | 1 days supply | Qty: 1 | Fill #0

## 2020-01-29 ENCOUNTER — Ambulatory Visit: Payer: No Typology Code available for payment source | Admitting: Cardiology

## 2020-01-29 ENCOUNTER — Other Ambulatory Visit: Payer: Self-pay

## 2020-01-29 ENCOUNTER — Encounter: Payer: Self-pay | Admitting: Cardiology

## 2020-01-29 ENCOUNTER — Other Ambulatory Visit: Payer: Self-pay | Admitting: Cardiology

## 2020-01-29 VITALS — BP 124/61 | HR 69 | Resp 15 | Ht 65.0 in | Wt 229.0 lb

## 2020-01-29 DIAGNOSIS — E1169 Type 2 diabetes mellitus with other specified complication: Secondary | ICD-10-CM

## 2020-01-29 DIAGNOSIS — E119 Type 2 diabetes mellitus without complications: Secondary | ICD-10-CM

## 2020-01-29 DIAGNOSIS — Z955 Presence of coronary angioplasty implant and graft: Secondary | ICD-10-CM

## 2020-01-29 DIAGNOSIS — I2510 Atherosclerotic heart disease of native coronary artery without angina pectoris: Secondary | ICD-10-CM

## 2020-01-29 DIAGNOSIS — I10 Essential (primary) hypertension: Secondary | ICD-10-CM

## 2020-01-29 DIAGNOSIS — E782 Mixed hyperlipidemia: Secondary | ICD-10-CM

## 2020-01-29 DIAGNOSIS — Z87891 Personal history of nicotine dependence: Secondary | ICD-10-CM

## 2020-01-29 DIAGNOSIS — E785 Hyperlipidemia, unspecified: Secondary | ICD-10-CM

## 2020-01-29 MED ORDER — ISOSORBIDE DINITRATE 20 MG PO TABS: 20.0000 mg | ORAL_TABLET | Freq: Three times a day (TID) | ORAL | 0 refills | Status: DC
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
## 2020-01-30 ENCOUNTER — Other Ambulatory Visit: Payer: Self-pay | Admitting: Cardiology

## 2020-01-30 DIAGNOSIS — E782 Mixed hyperlipidemia: Secondary | ICD-10-CM

## 2020-01-30 DIAGNOSIS — I10 Essential (primary) hypertension: Secondary | ICD-10-CM

## 2020-01-30 MED FILL — CARVEDILOL 12.5 MG TABLET: 12.5 | 90 days supply | Qty: 180 | Fill #0

## 2020-01-30 MED FILL — HYDROCHLOROTHIAZIDE 25 MG T: 25 | 90 days supply | Qty: 90 | Fill #2

## 2020-01-30 MED FILL — hydrALAZINE HCL 25 MG TABS: 25 | 90 days supply | Qty: 270 | Fill #2

## 2020-01-30 MED FILL — EZETIMIBE 10 MG TABS: 10 | 90 days supply | Qty: 90 | Fill #0

## 2020-01-30 MED FILL — LOSARTAN POTASSIUM 100 MG T: 100 | 90 days supply | Qty: 90 | Fill #2

## 2020-02-06 ENCOUNTER — Other Ambulatory Visit: Payer: Self-pay

## 2020-02-06 DIAGNOSIS — I2510 Atherosclerotic heart disease of native coronary artery without angina pectoris: Secondary | ICD-10-CM

## 2020-02-08 MED FILL — REPATHA SURECLICK 140 MG/ML: 140 | 28 days supply | Qty: 2 | Fill #2

## 2020-02-08 MED FILL — ISOSORBIDE DN 20 MG TABLET: 20 | 90 days supply | Qty: 270 | Fill #0

## 2020-03-11 MED FILL — REPATHA SURECLICK 140 MG/ML: 140 | 28 days supply | Qty: 2 | Fill #3

## 2020-03-28 ENCOUNTER — Other Ambulatory Visit: Payer: Self-pay | Admitting: Cardiology

## 2020-03-28 DIAGNOSIS — I10 Essential (primary) hypertension: Secondary | ICD-10-CM

## 2020-03-28 MED FILL — buPROPion HCL ER (XL) 150 M: 150 | 90 days supply | Qty: 90 | Fill #1

## 2020-03-28 MED FILL — SERTRALINE HCL 100 MG TAB: 100 | 90 days supply | Qty: 90 | Fill #2

## 2020-03-28 MED FILL — AMLODIPINE BESYLATE 10 MG T: 10 | 90 days supply | Qty: 90 | Fill #0

## 2020-03-28 MED FILL — PIOGLITAZONE HCL 15 MG TAB: 15 | 90 days supply | Qty: 90 | Fill #1

## 2020-03-28 MED FILL — METFORMIN HCL ER 500 MG TB2: 500 | 90 days supply | Qty: 180 | Fill #2

## 2020-03-29 MED FILL — traZODone HCL 100 MG TABS: 100 | 90 days supply | Qty: 180 | Fill #3

## 2020-04-09 ENCOUNTER — Other Ambulatory Visit: Payer: Self-pay | Admitting: Cardiology

## 2020-04-09 DIAGNOSIS — E782 Mixed hyperlipidemia: Secondary | ICD-10-CM

## 2020-04-10 ENCOUNTER — Other Ambulatory Visit: Payer: Self-pay | Admitting: Cardiology

## 2020-04-10 MED FILL — REPATHA SURECLICK 140 MG/ML: 140 | 28 days supply | Qty: 2 | Fill #0

## 2020-04-30 ENCOUNTER — Other Ambulatory Visit: Payer: Self-pay | Admitting: Cardiology

## 2020-04-30 DIAGNOSIS — I10 Essential (primary) hypertension: Secondary | ICD-10-CM

## 2020-04-30 DIAGNOSIS — E782 Mixed hyperlipidemia: Secondary | ICD-10-CM

## 2020-04-30 MED FILL — EZETIMIBE 10 MG TABS: 10 | 90 days supply | Qty: 90 | Fill #0

## 2020-04-30 MED FILL — ISOSORBIDE DN 20 MG TABLET: 20 | 90 days supply | Qty: 270 | Fill #0

## 2020-05-02 ENCOUNTER — Other Ambulatory Visit: Payer: Self-pay | Admitting: Cardiology

## 2020-05-02 DIAGNOSIS — I10 Essential (primary) hypertension: Secondary | ICD-10-CM

## 2020-05-02 MED FILL — hydrALAZINE HCL 25 MG TABS: 25 | 90 days supply | Qty: 270 | Fill #3

## 2020-05-02 MED FILL — LOSARTAN POTASSIUM 100 MG T: 100 | 30 days supply | Qty: 30 | Fill #3

## 2020-05-02 MED FILL — CARVEDILOL 12.5 MG TABLET: 12.5 | 90 days supply | Qty: 180 | Fill #0

## 2020-05-02 MED FILL — HYDROCHLOROTHIAZIDE 25 MG T: 25 | 90 days supply | Qty: 90 | Fill #3

## 2020-05-07 MED FILL — REPATHA SURECLICK 140 MG/ML: 140 | 28 days supply | Qty: 2 | Fill #1

## 2020-05-22 ENCOUNTER — Ambulatory Visit: Payer: No Typology Code available for payment source

## 2020-05-22 ENCOUNTER — Other Ambulatory Visit: Payer: Self-pay

## 2020-05-30 ENCOUNTER — Other Ambulatory Visit: Payer: Self-pay | Admitting: Cardiology

## 2020-05-30 DIAGNOSIS — E782 Mixed hyperlipidemia: Secondary | ICD-10-CM

## 2020-05-30 MED FILL — REPATHA SURECLICK 140 MG/ML: 140 | 28 days supply | Qty: 2 | Fill #0

## 2020-05-30 MED FILL — LOSARTAN POTASSIUM 100 MG T: 100 | 30 days supply | Qty: 30 | Fill #4

## 2020-06-07 ENCOUNTER — Ambulatory Visit: Payer: No Typology Code available for payment source | Admitting: Cardiology

## 2020-06-11 ENCOUNTER — Other Ambulatory Visit: Payer: Self-pay

## 2020-06-11 ENCOUNTER — Ambulatory Visit: Payer: No Typology Code available for payment source | Admitting: Cardiology

## 2020-06-11 ENCOUNTER — Encounter: Payer: Self-pay | Admitting: Cardiology

## 2020-06-11 VITALS — BP 124/62 | HR 63 | Temp 98.1°F | Ht 65.0 in | Wt 230.0 lb

## 2020-06-11 DIAGNOSIS — Z712 Person consulting for explanation of examination or test findings: Secondary | ICD-10-CM

## 2020-06-11 DIAGNOSIS — Z955 Presence of coronary angioplasty implant and graft: Secondary | ICD-10-CM

## 2020-06-11 DIAGNOSIS — E785 Hyperlipidemia, unspecified: Secondary | ICD-10-CM

## 2020-06-11 DIAGNOSIS — I2510 Atherosclerotic heart disease of native coronary artery without angina pectoris: Secondary | ICD-10-CM

## 2020-06-11 DIAGNOSIS — E119 Type 2 diabetes mellitus without complications: Secondary | ICD-10-CM

## 2020-06-11 DIAGNOSIS — E1169 Type 2 diabetes mellitus with other specified complication: Secondary | ICD-10-CM

## 2020-06-11 DIAGNOSIS — Z87891 Personal history of nicotine dependence: Secondary | ICD-10-CM

## 2020-06-11 DIAGNOSIS — I10 Essential (primary) hypertension: Secondary | ICD-10-CM

## 2020-06-11 DIAGNOSIS — E782 Mixed hyperlipidemia: Secondary | ICD-10-CM
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
## 2020-06-14 LAB — LIPID PANEL WITH LDL/HDL RATIO
Cholesterol, Total: 142 mg/dL (ref 100–199)
HDL: 61 mg/dL (ref >39)
LDL Chol Calc (NIH): 58 mg/dL (ref 0–99)
LDL/HDL Ratio: 1 ratio (ref 0.0–3.2)
Triglycerides: 131 mg/dL (ref 0–149)
VLDL Cholesterol Cal: 23 mg/dL (ref 5–40)

## 2020-06-14 LAB — COMP. METABOLIC PANEL (12)
AST: 22 IU/L (ref 0–40)
Albumin/Globulin Ratio: 1.6 (ref 1.2–2.2)
Albumin: 4.2 g/dL (ref 3.8–4.8)
Alkaline Phosphatase: 92 IU/L (ref 44–121)
BUN/Creatinine Ratio: 14 (ref 12–28)
BUN: 9 mg/dL (ref 8–27)
Bilirubin Total: 0.3 mg/dL (ref 0.0–1.2)
Calcium: 9.5 mg/dL (ref 8.7–10.3)
Chloride: 100 mmol/L (ref 96–106)
Creatinine, Ser: 0.63 mg/dL (ref 0.57–1.00)
GFR calc Af Amer: 110 mL/min/{1.73_m2} (ref >59)
GFR calc non Af Amer: 96 mL/min/{1.73_m2} (ref >59)
Globulin, Total: 2.7 g/dL (ref 1.5–4.5)
Glucose: 106 mg/dL — ABNORMAL HIGH (ref 65–99)
Potassium: 4 mmol/L (ref 3.5–5.2)
Sodium: 141 mmol/L (ref 134–144)
Total Protein: 6.9 g/dL (ref 6.0–8.5)

## 2020-06-14 LAB — LDL CHOLESTEROL, DIRECT: LDL Direct: 47 mg/dL (ref 0–99)

## 2020-06-27 MED FILL — METFORMIN HCL ER 500 MG TB2: 500 | 90 days supply | Qty: 180 | Fill #3

## 2020-06-27 MED FILL — REPATHA SURECLICK 140 MG/ML: 140 | 28 days supply | Qty: 2 | Fill #1

## 2020-06-27 MED FILL — SERTRALINE HCL 100 MG TAB: 100 | 90 days supply | Qty: 90 | Fill #3

## 2020-06-27 MED FILL — traZODone HCL 100 MG TABS: 100 | 90 days supply | Qty: 180 | Fill #4

## 2020-06-27 MED FILL — LOSARTAN POTASSIUM 100 MG T: 100 | 30 days supply | Qty: 30 | Fill #5

## 2020-06-27 MED FILL — PIOGLITAZONE HCL 15 MG TAB: 15 | 90 days supply | Qty: 90 | Fill #2

## 2020-06-27 MED FILL — AMLODIPINE BESYLATE 10 MG T: 10 | 90 days supply | Qty: 90 | Fill #1

## 2020-06-27 MED FILL — buPROPion HCL ER (XL) 150 M: 150 | 90 days supply | Qty: 90 | Fill #2

## 2020-07-24 ENCOUNTER — Other Ambulatory Visit (HOSPITAL_COMMUNITY): Payer: Self-pay | Admitting: Family Medicine

## 2020-07-24 ENCOUNTER — Other Ambulatory Visit: Payer: Self-pay | Admitting: Cardiology

## 2020-07-24 DIAGNOSIS — E782 Mixed hyperlipidemia: Secondary | ICD-10-CM

## 2020-07-24 MED FILL — REPATHA SURECLICK 140 MG/ML: 140 | 28 days supply | Qty: 2 | Fill #0

## 2020-07-24 MED FILL — LOSARTAN POTASSIUM 100 MG T: 100 | 30 days supply | Qty: 30 | Fill #0

## 2020-07-24 MED FILL — CARVEDILOL 12.5 MG TABLET: 12.5 | 90 days supply | Qty: 180 | Fill #0

## 2020-07-24 MED FILL — HYDROCHLOROTHIAZIDE 25 MG T: 25 | 90 days supply | Qty: 90 | Fill #0

## 2020-07-24 MED FILL — EZETIMIBE 10 MG TABS: 10 | 90 days supply | Qty: 90 | Fill #0

## 2020-07-31 ENCOUNTER — Other Ambulatory Visit: Payer: Self-pay | Admitting: Cardiology

## 2020-07-31 DIAGNOSIS — I10 Essential (primary) hypertension: Secondary | ICD-10-CM

## 2020-08-01 ENCOUNTER — Other Ambulatory Visit (HOSPITAL_COMMUNITY): Payer: Self-pay

## 2020-08-01 MED ORDER — HYDRALAZINE HCL 25 MG PO TABS
25.0000 mg | ORAL_TABLET | Freq: Three times a day (TID) | ORAL | 0 refills | Status: DC
  Filled 2020-08-01: qty 270, 90d supply, fill #0

## 2020-08-01 MED ORDER — ISOSORBIDE DINITRATE 20 MG PO TABS
20.0000 mg | ORAL_TABLET | Freq: Three times a day (TID) | ORAL | 0 refills | Status: DC
  Filled 2020-08-01: qty 270, 90d supply, fill #0

## 2020-08-14 ENCOUNTER — Other Ambulatory Visit (HOSPITAL_COMMUNITY): Payer: Self-pay

## 2020-08-14 MED FILL — Evolocumab Subcutaneous Soln Auto-Injector 140 MG/ML: SUBCUTANEOUS | 28 days supply | Qty: 2 | Fill #0 | Status: CN

## 2020-08-15 ENCOUNTER — Other Ambulatory Visit (HOSPITAL_COMMUNITY): Payer: Self-pay

## 2020-08-23 ENCOUNTER — Other Ambulatory Visit (HOSPITAL_COMMUNITY): Payer: Self-pay

## 2020-08-26 ENCOUNTER — Other Ambulatory Visit (HOSPITAL_COMMUNITY): Payer: Self-pay

## 2020-08-26 MED ORDER — BUPROPION HCL ER (XL) 150 MG PO TB24
150.0000 mg | ORAL_TABLET | Freq: Every morning | ORAL | 4 refills | Status: DC
  Filled 2020-08-26: qty 90, 90d supply, fill #0

## 2020-08-26 MED ORDER — PIOGLITAZONE HCL 15 MG PO TABS
15.0000 mg | ORAL_TABLET | Freq: Every day | ORAL | 4 refills | Status: DC
  Filled 2020-08-26: qty 90, 90d supply, fill #0

## 2020-08-26 MED ORDER — SERTRALINE HCL 100 MG PO TABS
100.0000 mg | ORAL_TABLET | Freq: Every day | ORAL | 4 refills | Status: DC
  Filled 2020-08-26: qty 90, 90d supply, fill #0

## 2020-08-26 MED ORDER — TRAZODONE HCL 100 MG PO TABS
200.0000 mg | ORAL_TABLET | Freq: Every day | ORAL | 4 refills | Status: DC
  Filled 2020-08-26: qty 180, 90d supply, fill #0

## 2020-08-26 MED FILL — Evolocumab Subcutaneous Soln Auto-Injector 140 MG/ML: SUBCUTANEOUS | 28 days supply | Qty: 2 | Fill #0 | Status: AC

## 2020-08-26 MED FILL — Losartan Potassium Tab 100 MG: ORAL | 30 days supply | Qty: 30 | Fill #0 | Status: AC

## 2020-08-27 ENCOUNTER — Other Ambulatory Visit (HOSPITAL_COMMUNITY): Payer: Self-pay

## 2020-08-27 MED ORDER — METFORMIN HCL ER 500 MG PO TB24
1000.0000 mg | ORAL_TABLET | Freq: Every evening | ORAL | 4 refills | Status: DC
  Filled 2020-08-27: qty 180, 90d supply, fill #0

## 2020-09-17 ENCOUNTER — Other Ambulatory Visit (HOSPITAL_BASED_OUTPATIENT_CLINIC_OR_DEPARTMENT_OTHER): Payer: Self-pay

## 2020-09-17 DIAGNOSIS — R5383 Other fatigue: Secondary | ICD-10-CM

## 2020-09-17 DIAGNOSIS — G4719 Other hypersomnia: Secondary | ICD-10-CM

## 2020-09-17 DIAGNOSIS — R0683 Snoring: Secondary | ICD-10-CM

## 2020-09-19 ENCOUNTER — Other Ambulatory Visit: Payer: Self-pay | Admitting: Cardiology

## 2020-09-19 ENCOUNTER — Other Ambulatory Visit (HOSPITAL_COMMUNITY): Payer: Self-pay

## 2020-09-19 DIAGNOSIS — E782 Mixed hyperlipidemia: Secondary | ICD-10-CM

## 2020-09-19 MED ORDER — REPATHA SURECLICK 140 MG/ML ~~LOC~~ SOAJ
140.0000 mg | SUBCUTANEOUS | 1 refills | Status: DC
  Filled 2020-09-19: qty 2, 28d supply, fill #0
  Filled 2020-10-29: qty 2, 28d supply, fill #1

## 2020-09-23 ENCOUNTER — Other Ambulatory Visit: Payer: Self-pay | Admitting: Cardiology

## 2020-09-23 ENCOUNTER — Other Ambulatory Visit (HOSPITAL_COMMUNITY): Payer: Self-pay

## 2020-09-23 DIAGNOSIS — I10 Essential (primary) hypertension: Secondary | ICD-10-CM

## 2020-09-24 ENCOUNTER — Other Ambulatory Visit (HOSPITAL_COMMUNITY): Payer: Self-pay

## 2020-09-24 MED ORDER — METFORMIN HCL ER 500 MG PO TB24
1000.0000 mg | ORAL_TABLET | Freq: Every evening | ORAL | 1 refills | Status: DC
  Filled 2020-09-24: qty 180, 90d supply, fill #0
  Filled 2020-12-25: qty 180, 90d supply, fill #1

## 2020-09-24 MED ORDER — PIOGLITAZONE HCL 15 MG PO TABS
15.0000 mg | ORAL_TABLET | Freq: Every day | ORAL | 1 refills | Status: DC
  Filled 2020-09-24: qty 90, 90d supply, fill #0
  Filled 2020-10-29 – 2020-12-25 (×2): qty 90, 90d supply, fill #1

## 2020-09-24 MED ORDER — LOSARTAN POTASSIUM 100 MG PO TABS
100.0000 mg | ORAL_TABLET | Freq: Every day | ORAL | 1 refills | Status: DC
  Filled 2020-09-24: qty 90, 90d supply, fill #0
  Filled 2020-12-25: qty 90, 90d supply, fill #1

## 2020-09-24 MED ORDER — TRAZODONE HCL 100 MG PO TABS
200.0000 mg | ORAL_TABLET | Freq: Every day | ORAL | 1 refills | Status: DC
  Filled 2020-09-24: qty 180, 90d supply, fill #0
  Filled 2020-12-25: qty 180, 90d supply, fill #1

## 2020-09-24 MED ORDER — AMLODIPINE BESYLATE 10 MG PO TABS
10.0000 mg | ORAL_TABLET | Freq: Every day | ORAL | 1 refills | Status: DC
  Filled 2020-09-24: qty 90, 90d supply, fill #0
  Filled 2020-12-25: qty 90, 90d supply, fill #1

## 2020-09-24 MED ORDER — BUPROPION HCL ER (XL) 150 MG PO TB24
150.0000 mg | ORAL_TABLET | Freq: Every morning | ORAL | 1 refills | Status: DC
  Filled 2020-09-24: qty 90, 90d supply, fill #0
  Filled 2020-10-29 – 2020-12-25 (×2): qty 90, 90d supply, fill #1

## 2020-09-24 MED ORDER — SERTRALINE HCL 100 MG PO TABS
100.0000 mg | ORAL_TABLET | Freq: Every day | ORAL | 1 refills | Status: DC
  Filled 2020-09-24: qty 90, 90d supply, fill #0
  Filled 2020-12-25: qty 90, 90d supply, fill #1

## 2020-09-25 ENCOUNTER — Other Ambulatory Visit (HOSPITAL_COMMUNITY): Payer: Self-pay

## 2020-10-22 ENCOUNTER — Other Ambulatory Visit: Payer: Self-pay

## 2020-10-22 ENCOUNTER — Ambulatory Visit (HOSPITAL_BASED_OUTPATIENT_CLINIC_OR_DEPARTMENT_OTHER): Payer: No Typology Code available for payment source | Admitting: Sleep Medicine

## 2020-10-29 ENCOUNTER — Other Ambulatory Visit (HOSPITAL_COMMUNITY): Payer: Self-pay

## 2020-10-29 ENCOUNTER — Other Ambulatory Visit: Payer: Self-pay | Admitting: Cardiology

## 2020-10-29 DIAGNOSIS — I10 Essential (primary) hypertension: Secondary | ICD-10-CM

## 2020-10-29 DIAGNOSIS — E782 Mixed hyperlipidemia: Secondary | ICD-10-CM

## 2020-10-29 MED ORDER — EZETIMIBE 10 MG PO TABS
10.0000 mg | ORAL_TABLET | Freq: Every day | ORAL | 0 refills | Status: DC
  Filled 2020-10-29: qty 90, 90d supply, fill #0

## 2020-10-29 MED ORDER — CARVEDILOL 12.5 MG PO TABS
12.5000 mg | ORAL_TABLET | Freq: Two times a day (BID) | ORAL | 0 refills | Status: DC
  Filled 2020-10-29: qty 180, 90d supply, fill #0

## 2020-10-29 MED ORDER — HYDRALAZINE HCL 25 MG PO TABS
25.0000 mg | ORAL_TABLET | Freq: Three times a day (TID) | ORAL | 0 refills | Status: DC
  Filled 2020-10-29: qty 270, 90d supply, fill #0

## 2020-10-29 MED ORDER — ISOSORBIDE DINITRATE 20 MG PO TABS
20.0000 mg | ORAL_TABLET | Freq: Three times a day (TID) | ORAL | 0 refills | Status: DC
  Filled 2020-10-29: qty 270, 90d supply, fill #0

## 2020-10-29 MED FILL — Hydrochlorothiazide Tab 25 MG: ORAL | 90 days supply | Qty: 90 | Fill #0 | Status: AC

## 2020-10-31 ENCOUNTER — Ambulatory Visit (HOSPITAL_BASED_OUTPATIENT_CLINIC_OR_DEPARTMENT_OTHER): Payer: No Typology Code available for payment source | Attending: Sleep Medicine | Admitting: Sleep Medicine

## 2020-10-31 ENCOUNTER — Other Ambulatory Visit: Payer: Self-pay

## 2020-10-31 VITALS — Ht 65.0 in | Wt 230.0 lb

## 2020-10-31 DIAGNOSIS — G4733 Obstructive sleep apnea (adult) (pediatric): Secondary | ICD-10-CM | POA: Diagnosis present

## 2020-11-15 NOTE — Procedures (Signed)
    NAME: Karina Oneal DATE OF BIRTH:  10/01/1955 MEDICAL RECORD NUMBER [id number]  LOCATION: [facility]  PHYSICIAN: Janny Crute D Monquie Fulgham  DATE OF STUDY: 10/31/2020  SLEEP STUDY TYPE: Home sleep study                REFERRING PHYSICIAN: Alanda Slim, MD  CLINICAL INFORMATION Jayni Prescher is a 65 year old Female and was referred to the sleep center for evaluation of possible OSA.  MEDICATIONS Patient self administered medications include: N/A.  SLEEP STUDY TECHNIQUE A multi-channel overnight portable sleep study was performed. The channels recorded were: nasal and oral airflow, thoracic and abdominal respiratory movement, and oxygen saturation with a pulse oximetry. Snoring and body position were also monitored.  TECHNICIAN COMMENTS Comments added by Technician: N/A Comments added by Scorer: N/A RECORDING SUMMARY The study was initiated at 7:51:46 PM and terminated at 7:46:40 AM. The total recorded time was 714.9 minutes. Time in bed was 558.2 minutes. RESPIRATORY PARAMETERS There were a total of apneacount apneas ( 252 obstructive, 0 mixed, 0 central) and 39 hypopneas. The apnea/hypopnea index (AHI) was 31.4 events/hour. The central sleep apnea index was 0 events/hour. Respiratory disturbances were associated with oxygen desaturation down to a nadir of 84%. The mean oxygen saturation during the study was 91%. The cumulative time under or = 88% oxygen saturation was 17.0 minutes.  CARDIAC DATA Mean heart rate during sleep was 64.2 bpm.  IMPRESSIONS - Patient snored 63.7% during the sleep. - Moderate Oxygen Desaturation - Severe Obstructive Sleep apnea (OSA) DIAGNOSIS - Severe Obstructive Sleep Apnea (G47.33) RECOMMENDATIONS - Recommend a trial of Auto-CPAP 4 - 16 cm H2O. - Avoid alcohol, sedatives and other CNS depressants that may worsen sleep apnea and disrupt normal sleep architecture. - Sleep hygiene should be reviewed to assess factors that may improve  sleep quality. - Weight management and regular exercise should be initiated or continued.  Ja Pistole D Mariette Cowley Diplomate, American Board of Internal Medicine  ELECTRONICALLY SIGNED ON:  11/15/2020, 1:18 PM [facility] PH: [phone]   FX: [phone] ACCREDITED BY THE AMERICAN ACADEMY OF SLEEP MEDICINE

## 2020-11-25 ENCOUNTER — Other Ambulatory Visit: Payer: Self-pay | Admitting: Cardiology

## 2020-11-25 ENCOUNTER — Other Ambulatory Visit (HOSPITAL_COMMUNITY): Payer: Self-pay

## 2020-11-25 DIAGNOSIS — E782 Mixed hyperlipidemia: Secondary | ICD-10-CM

## 2020-11-25 MED ORDER — REPATHA SURECLICK 140 MG/ML ~~LOC~~ SOAJ
140.0000 mg | SUBCUTANEOUS | 1 refills | Status: DC
Start: 2020-11-25 — End: 2021-01-28
  Filled 2020-11-25: qty 2, 28d supply, fill #0
  Filled 2020-12-25: qty 2, 28d supply, fill #1

## 2020-12-24 ENCOUNTER — Other Ambulatory Visit (HOSPITAL_COMMUNITY): Payer: Self-pay

## 2020-12-25 ENCOUNTER — Other Ambulatory Visit (HOSPITAL_COMMUNITY): Payer: Self-pay

## 2020-12-25 ENCOUNTER — Other Ambulatory Visit: Payer: Self-pay | Admitting: Cardiology

## 2020-12-25 DIAGNOSIS — E782 Mixed hyperlipidemia: Secondary | ICD-10-CM

## 2020-12-25 MED ORDER — EZETIMIBE 10 MG PO TABS
10.0000 mg | ORAL_TABLET | Freq: Every day | ORAL | 1 refills | Status: DC
  Filled 2020-12-25 – 2021-01-25 (×2): qty 90, 90d supply, fill #0
  Filled 2021-04-15: qty 90, 90d supply, fill #1

## 2021-01-25 ENCOUNTER — Other Ambulatory Visit: Payer: Self-pay | Admitting: Cardiology

## 2021-01-25 DIAGNOSIS — I10 Essential (primary) hypertension: Secondary | ICD-10-CM

## 2021-01-27 ENCOUNTER — Other Ambulatory Visit (HOSPITAL_COMMUNITY): Payer: Self-pay

## 2021-01-27 MED ORDER — ISOSORBIDE DINITRATE 20 MG PO TABS
20.0000 mg | ORAL_TABLET | Freq: Three times a day (TID) | ORAL | 0 refills | Status: DC
  Filled 2021-01-27: qty 270, 90d supply, fill #0

## 2021-01-28 ENCOUNTER — Other Ambulatory Visit (HOSPITAL_COMMUNITY): Payer: Self-pay

## 2021-01-28 ENCOUNTER — Other Ambulatory Visit: Payer: Self-pay | Admitting: Cardiology

## 2021-01-28 DIAGNOSIS — E782 Mixed hyperlipidemia: Secondary | ICD-10-CM

## 2021-01-29 ENCOUNTER — Other Ambulatory Visit: Payer: Self-pay | Admitting: Cardiology

## 2021-01-29 ENCOUNTER — Other Ambulatory Visit (HOSPITAL_COMMUNITY): Payer: Self-pay

## 2021-01-29 DIAGNOSIS — I10 Essential (primary) hypertension: Secondary | ICD-10-CM

## 2021-01-29 MED ORDER — REPATHA SURECLICK 140 MG/ML ~~LOC~~ SOAJ
140.0000 mg | SUBCUTANEOUS | 1 refills | Status: DC
  Filled 2021-01-29: qty 2, 28d supply, fill #0
  Filled 2021-03-12: qty 2, 28d supply, fill #1

## 2021-01-29 MED ORDER — HYDROCHLOROTHIAZIDE 25 MG PO TABS
25.0000 mg | ORAL_TABLET | Freq: Every day | ORAL | 3 refills | Status: DC
  Filled 2021-01-29: qty 90, 90d supply, fill #0
  Filled 2021-04-15 – 2021-04-30 (×2): qty 90, 90d supply, fill #1

## 2021-01-29 MED ORDER — CARVEDILOL 12.5 MG PO TABS
12.5000 mg | ORAL_TABLET | Freq: Two times a day (BID) | ORAL | 1 refills | Status: DC
  Filled 2021-01-29: qty 180, 90d supply, fill #0
  Filled 2021-04-30: qty 180, 90d supply, fill #1

## 2021-02-05 ENCOUNTER — Other Ambulatory Visit (HOSPITAL_COMMUNITY): Payer: Self-pay

## 2021-02-17 ENCOUNTER — Other Ambulatory Visit: Payer: Self-pay | Admitting: Cardiology

## 2021-02-17 DIAGNOSIS — I10 Essential (primary) hypertension: Secondary | ICD-10-CM

## 2021-02-18 ENCOUNTER — Other Ambulatory Visit (HOSPITAL_COMMUNITY): Payer: Self-pay

## 2021-02-18 MED ORDER — HYDRALAZINE HCL 25 MG PO TABS
25.0000 mg | ORAL_TABLET | Freq: Three times a day (TID) | ORAL | 0 refills | Status: DC
  Filled 2021-02-18: qty 270, 90d supply, fill #0

## 2021-02-26 ENCOUNTER — Other Ambulatory Visit (HOSPITAL_COMMUNITY): Payer: Self-pay

## 2021-02-26 MED ORDER — TRAZODONE HCL 100 MG PO TABS
200.0000 mg | ORAL_TABLET | Freq: Every day | ORAL | 4 refills | Status: DC
  Filled 2021-02-26: qty 180, 90d supply, fill #0

## 2021-02-26 MED ORDER — LOSARTAN POTASSIUM 100 MG PO TABS
100.0000 mg | ORAL_TABLET | Freq: Every day | ORAL | 4 refills | Status: DC
  Filled 2021-02-26 – 2021-03-25 (×2): qty 90, 90d supply, fill #0

## 2021-02-26 MED ORDER — SERTRALINE HCL 100 MG PO TABS
100.0000 mg | ORAL_TABLET | Freq: Every day | ORAL | 4 refills | Status: DC
  Filled 2021-02-26: qty 90, 90d supply, fill #0

## 2021-02-26 MED ORDER — HYDROCHLOROTHIAZIDE 25 MG PO TABS
25.0000 mg | ORAL_TABLET | Freq: Every day | ORAL | 4 refills | Status: DC
  Filled 2021-02-26: qty 90, 90d supply, fill #0
  Filled 2021-08-04: qty 90, 90d supply, fill #1
  Filled 2021-08-04: qty 90, 90d supply, fill #0

## 2021-02-26 MED ORDER — METFORMIN HCL ER 500 MG PO TB24
1000.0000 mg | ORAL_TABLET | Freq: Every evening | ORAL | 4 refills | Status: DC
  Filled 2021-02-26: qty 90, 45d supply, fill #0
  Filled 2021-04-15: qty 180, 90d supply, fill #0

## 2021-02-26 MED ORDER — BUPROPION HCL ER (XL) 150 MG PO TB24
150.0000 mg | ORAL_TABLET | Freq: Every morning | ORAL | 4 refills | Status: DC
  Filled 2021-02-26: qty 90, 90d supply, fill #0

## 2021-02-26 MED ORDER — AMLODIPINE BESYLATE 10 MG PO TABS
10.0000 mg | ORAL_TABLET | Freq: Every day | ORAL | 4 refills | Status: DC
  Filled 2021-02-26 – 2021-03-25 (×2): qty 90, 90d supply, fill #0
  Filled 2021-06-23: qty 90, 90d supply, fill #1
  Filled 2021-09-24: qty 90, 90d supply, fill #2
  Filled 2021-12-30: qty 90, 90d supply, fill #3

## 2021-02-26 MED ORDER — PIOGLITAZONE HCL 15 MG PO TABS
15.0000 mg | ORAL_TABLET | Freq: Every day | ORAL | 4 refills | Status: DC
  Filled 2021-02-26: qty 90, 90d supply, fill #0

## 2021-03-12 ENCOUNTER — Other Ambulatory Visit (HOSPITAL_COMMUNITY): Payer: Self-pay

## 2021-03-25 ENCOUNTER — Other Ambulatory Visit (HOSPITAL_COMMUNITY): Payer: Self-pay

## 2021-03-25 MED ORDER — BUPROPION HCL ER (XL) 150 MG PO TB24
150.0000 mg | ORAL_TABLET | Freq: Every morning | ORAL | 1 refills | Status: DC
  Filled 2021-03-25: qty 90, 90d supply, fill #0

## 2021-03-25 MED ORDER — SERTRALINE HCL 100 MG PO TABS
100.0000 mg | ORAL_TABLET | Freq: Every day | ORAL | 1 refills | Status: DC
  Filled 2021-03-25: qty 90, 90d supply, fill #0

## 2021-03-25 MED ORDER — PIOGLITAZONE HCL 15 MG PO TABS
15.0000 mg | ORAL_TABLET | Freq: Every day | ORAL | 1 refills | Status: DC
  Filled 2021-03-25: qty 90, 90d supply, fill #0
  Filled 2021-06-23: qty 90, 90d supply, fill #1

## 2021-03-25 MED ORDER — TRAZODONE HCL 100 MG PO TABS
200.0000 mg | ORAL_TABLET | Freq: Every day | ORAL | 1 refills | Status: DC
  Filled 2021-03-25: qty 180, 90d supply, fill #0

## 2021-04-15 ENCOUNTER — Other Ambulatory Visit: Payer: Self-pay | Admitting: Cardiology

## 2021-04-15 ENCOUNTER — Other Ambulatory Visit (HOSPITAL_COMMUNITY): Payer: Self-pay

## 2021-04-15 DIAGNOSIS — E782 Mixed hyperlipidemia: Secondary | ICD-10-CM

## 2021-04-15 MED ORDER — REPATHA SURECLICK 140 MG/ML ~~LOC~~ SOAJ
140.0000 mg | SUBCUTANEOUS | 3 refills | Status: DC
  Filled 2021-04-15: qty 2, 28d supply, fill #0
  Filled 2021-05-20: qty 2, 28d supply, fill #1
  Filled 2021-06-23: qty 2, 28d supply, fill #2
  Filled 2021-07-29: qty 2, 28d supply, fill #3

## 2021-04-30 ENCOUNTER — Other Ambulatory Visit (HOSPITAL_COMMUNITY): Payer: Self-pay

## 2021-05-20 ENCOUNTER — Other Ambulatory Visit: Payer: Self-pay | Admitting: Cardiology

## 2021-05-20 ENCOUNTER — Other Ambulatory Visit (HOSPITAL_COMMUNITY): Payer: Self-pay

## 2021-05-20 DIAGNOSIS — I10 Essential (primary) hypertension: Secondary | ICD-10-CM

## 2021-05-20 MED ORDER — ISOSORBIDE DINITRATE 20 MG PO TABS
20.0000 mg | ORAL_TABLET | Freq: Three times a day (TID) | ORAL | 0 refills | Status: DC
  Filled 2021-05-20: qty 270, 90d supply, fill #0

## 2021-05-20 MED ORDER — HYDRALAZINE HCL 25 MG PO TABS
25.0000 mg | ORAL_TABLET | Freq: Three times a day (TID) | ORAL | 0 refills | Status: DC
  Filled 2021-05-20: qty 270, 90d supply, fill #0

## 2021-05-28 ENCOUNTER — Other Ambulatory Visit (HOSPITAL_COMMUNITY): Payer: Self-pay

## 2021-05-28 ENCOUNTER — Other Ambulatory Visit: Payer: Self-pay | Admitting: Surgery

## 2021-05-28 MED ORDER — AMOXICILLIN-POT CLAVULANATE 875-125 MG PO TABS
1.0000 | ORAL_TABLET | Freq: Two times a day (BID) | ORAL | 0 refills | Status: DC
  Filled 2021-05-28: qty 20, 10d supply, fill #0

## 2021-05-28 MED ORDER — FLUCONAZOLE 150 MG PO TABS
150.0000 mg | ORAL_TABLET | ORAL | 0 refills | Status: DC
  Filled 2021-05-28: qty 4, 28d supply, fill #0

## 2021-06-03 ENCOUNTER — Other Ambulatory Visit (HOSPITAL_COMMUNITY): Payer: Self-pay | Admitting: Family Medicine

## 2021-06-03 ENCOUNTER — Other Ambulatory Visit: Payer: Self-pay | Admitting: Family Medicine

## 2021-06-03 DIAGNOSIS — R1032 Left lower quadrant pain: Secondary | ICD-10-CM

## 2021-06-06 ENCOUNTER — Other Ambulatory Visit: Payer: Self-pay

## 2021-06-06 ENCOUNTER — Ambulatory Visit (HOSPITAL_COMMUNITY)
Admission: RE | Admit: 2021-06-06 | Discharge: 2021-06-06 | Disposition: A | Discharge status: Home / Self Care | Payer: No Typology Code available for payment source | Source: Ambulatory Visit | Attending: Family Medicine | Admitting: Family Medicine

## 2021-06-06 DIAGNOSIS — R1032 Left lower quadrant pain: Secondary | ICD-10-CM | POA: Insufficient documentation

## 2021-06-06 LAB — POCT I-STAT CREATININE: Creatinine, Ser: 0.6 mg/dL (ref 0.44–1.00)

## 2021-06-06 MED ORDER — IOHEXOL 300 MG/ML  SOLN
100.0000 mL | Freq: Once | INTRAMUSCULAR | Status: AC | PRN
  Administered 2021-06-06: 100 mL via INTRAVENOUS

## 2021-06-09 ENCOUNTER — Other Ambulatory Visit (HOSPITAL_COMMUNITY): Payer: No Typology Code available for payment source

## 2021-06-09 ENCOUNTER — Encounter (HOSPITAL_COMMUNITY): Payer: Self-pay

## 2021-06-11 ENCOUNTER — Ambulatory Visit: Payer: No Typology Code available for payment source | Admitting: Cardiology

## 2021-06-11 ENCOUNTER — Encounter: Payer: Self-pay | Admitting: Cardiology

## 2021-06-11 ENCOUNTER — Other Ambulatory Visit: Payer: Self-pay

## 2021-06-11 VITALS — BP 133/58 | HR 57 | Temp 97.3°F | Resp 16 | Ht 65.0 in | Wt 229.0 lb

## 2021-06-11 DIAGNOSIS — I10 Essential (primary) hypertension: Secondary | ICD-10-CM

## 2021-06-11 DIAGNOSIS — Z87891 Personal history of nicotine dependence: Secondary | ICD-10-CM

## 2021-06-11 DIAGNOSIS — Z955 Presence of coronary angioplasty implant and graft: Secondary | ICD-10-CM

## 2021-06-11 DIAGNOSIS — E119 Type 2 diabetes mellitus without complications: Secondary | ICD-10-CM

## 2021-06-11 DIAGNOSIS — I2510 Atherosclerotic heart disease of native coronary artery without angina pectoris: Secondary | ICD-10-CM

## 2021-06-11 DIAGNOSIS — E782 Mixed hyperlipidemia: Secondary | ICD-10-CM

## 2021-06-11 DIAGNOSIS — E785 Hyperlipidemia, unspecified: Secondary | ICD-10-CM

## 2021-06-11 DIAGNOSIS — E1169 Type 2 diabetes mellitus with other specified complication: Secondary | ICD-10-CM
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
## 2021-06-23 ENCOUNTER — Other Ambulatory Visit: Payer: Self-pay

## 2021-06-23 ENCOUNTER — Other Ambulatory Visit (HOSPITAL_COMMUNITY): Payer: Self-pay

## 2021-06-23 DIAGNOSIS — E782 Mixed hyperlipidemia: Secondary | ICD-10-CM

## 2021-06-23 DIAGNOSIS — I2510 Atherosclerotic heart disease of native coronary artery without angina pectoris: Secondary | ICD-10-CM

## 2021-06-23 DIAGNOSIS — Z955 Presence of coronary angioplasty implant and graft: Secondary | ICD-10-CM

## 2021-06-23 MED ORDER — PIOGLITAZONE HCL 15 MG PO TABS
15.0000 mg | ORAL_TABLET | Freq: Every day | ORAL | 1 refills | Status: DC
  Filled 2021-06-23: qty 90, 90d supply, fill #0

## 2021-06-23 MED ORDER — SERTRALINE HCL 100 MG PO TABS
100.0000 mg | ORAL_TABLET | Freq: Every day | ORAL | 1 refills | Status: DC
  Filled 2021-06-23: qty 90, 90d supply, fill #0
  Filled 2021-09-24: qty 90, 90d supply, fill #1

## 2021-06-23 MED ORDER — LOSARTAN POTASSIUM 100 MG PO TABS
100.0000 mg | ORAL_TABLET | Freq: Every day | ORAL | 1 refills | Status: DC
  Filled 2021-06-23: qty 90, 90d supply, fill #0
  Filled 2021-10-07: qty 90, 90d supply, fill #1

## 2021-06-23 MED ORDER — BUPROPION HCL ER (XL) 150 MG PO TB24
150.0000 mg | ORAL_TABLET | Freq: Every day | ORAL | 1 refills | Status: DC
  Filled 2021-06-23: qty 90, 90d supply, fill #0
  Filled 2021-09-24: qty 90, 90d supply, fill #1

## 2021-07-02 ENCOUNTER — Other Ambulatory Visit (HOSPITAL_COMMUNITY): Payer: Self-pay

## 2021-07-02 MED ORDER — NEOMYCIN SULFATE 500 MG PO TABS
1000.0000 mg | ORAL_TABLET | Freq: Three times a day (TID) | ORAL | 0 refills | Status: DC
Start: 2021-07-02 — End: 2022-06-11
  Filled 2021-07-02 – 2021-07-14 (×2): qty 6, 1d supply, fill #0

## 2021-07-02 MED ORDER — POLYETHYLENE GLYCOL 3350 17 GM/SCOOP PO POWD
238.0000 g | Freq: Every day | ORAL | 0 refills | Status: DC
  Filled 2021-07-02: qty 238, 1d supply, fill #0

## 2021-07-02 MED ORDER — FLUCONAZOLE 150 MG PO TABS
150.0000 mg | ORAL_TABLET | Freq: Once | ORAL | 0 refills | Status: AC
  Filled 2021-07-02: qty 1, 1d supply, fill #0

## 2021-07-02 MED ORDER — BISACODYL EC 5 MG PO TBEC: 20.0000 mg | DELAYED_RELEASE_TABLET | Freq: Every day | ORAL | 0 refills | Status: DC | PRN

## 2021-07-02 MED ORDER — METRONIDAZOLE 500 MG PO TABS
1000.0000 mg | ORAL_TABLET | Freq: Three times a day (TID) | ORAL | 0 refills | Status: DC
  Filled 2021-07-02: qty 6, 1d supply, fill #0

## 2021-07-03 ENCOUNTER — Other Ambulatory Visit (HOSPITAL_COMMUNITY): Payer: Self-pay

## 2021-07-11 ENCOUNTER — Other Ambulatory Visit (HOSPITAL_COMMUNITY): Payer: Self-pay

## 2021-07-14 ENCOUNTER — Other Ambulatory Visit (HOSPITAL_COMMUNITY): Payer: Self-pay

## 2021-07-29 ENCOUNTER — Other Ambulatory Visit: Payer: Self-pay

## 2021-07-29 ENCOUNTER — Other Ambulatory Visit (HOSPITAL_COMMUNITY): Payer: Self-pay

## 2021-07-29 ENCOUNTER — Other Ambulatory Visit: Payer: Self-pay | Admitting: Cardiology

## 2021-07-29 DIAGNOSIS — I10 Essential (primary) hypertension: Secondary | ICD-10-CM

## 2021-07-29 DIAGNOSIS — E782 Mixed hyperlipidemia: Secondary | ICD-10-CM

## 2021-07-29 MED ORDER — CARVEDILOL 12.5 MG PO TABS
12.5000 mg | ORAL_TABLET | Freq: Two times a day (BID) | ORAL | 1 refills | Status: DC
  Filled 2021-07-29: qty 180, 90d supply, fill #0
  Filled 2021-11-04: qty 180, 90d supply, fill #1

## 2021-07-29 MED ORDER — EZETIMIBE 10 MG PO TABS
10.0000 mg | ORAL_TABLET | Freq: Every day | ORAL | 1 refills | Status: DC
  Filled 2021-07-29: qty 90, 90d supply, fill #0
  Filled 2021-11-04: qty 90, 90d supply, fill #1

## 2021-07-29 MED ORDER — TRAZODONE HCL 100 MG PO TABS
200.0000 mg | ORAL_TABLET | Freq: Every day | ORAL | 2 refills | Status: DC
  Filled 2021-07-29: qty 180, 90d supply, fill #0
  Filled 2021-11-04: qty 180, 90d supply, fill #1
  Filled 2022-03-12: qty 180, 90d supply, fill #2

## 2021-07-29 MED ORDER — METFORMIN HCL ER 500 MG PO TB24
500.0000 mg | ORAL_TABLET | Freq: Every day | ORAL | 4 refills | Status: DC
  Filled 2021-07-29: qty 90, 90d supply, fill #0
  Filled 2021-11-04: qty 90, 90d supply, fill #1

## 2021-07-29 MED ORDER — HYDRALAZINE HCL 25 MG PO TABS
25.0000 mg | ORAL_TABLET | Freq: Three times a day (TID) | ORAL | 0 refills | Status: DC
  Filled 2021-07-29: qty 270, 90d supply, fill #0

## 2021-07-30 ENCOUNTER — Other Ambulatory Visit (HOSPITAL_COMMUNITY): Payer: Self-pay

## 2021-07-31 ENCOUNTER — Ambulatory Visit: Payer: Self-pay | Admitting: General Surgery

## 2021-07-31 ENCOUNTER — Other Ambulatory Visit (HOSPITAL_COMMUNITY): Payer: Self-pay

## 2021-07-31 DIAGNOSIS — K5732 Diverticulitis of large intestine without perforation or abscess without bleeding: Secondary | ICD-10-CM

## 2021-08-04 ENCOUNTER — Other Ambulatory Visit (HOSPITAL_COMMUNITY): Payer: Self-pay

## 2021-08-05 ENCOUNTER — Other Ambulatory Visit (HOSPITAL_COMMUNITY): Payer: Self-pay

## 2021-08-05 MED ORDER — HYDROCHLOROTHIAZIDE 25 MG PO TABS
25.0000 mg | ORAL_TABLET | Freq: Every day | ORAL | 2 refills | Status: DC
  Filled 2021-08-05 – 2021-11-04 (×2): qty 90, 90d supply, fill #0
  Filled 2022-01-27: qty 90, 90d supply, fill #1
  Filled 2022-07-28: qty 90, 90d supply, fill #2

## 2021-08-08 ENCOUNTER — Other Ambulatory Visit (HOSPITAL_COMMUNITY): Payer: No Typology Code available for payment source

## 2021-08-11 NOTE — Patient Instructions (Addendum)
DUE TO COVID-19 ONLY ONE VISITOR  (aged 65 and older)  IS ALLOWED TO COME WITH YOU AND STAY IN THE WAITING ROOM ONLY DURING PRE OP AND PROCEDURE.   ?**NO VISITORS ARE ALLOWED IN THE SHORT STAY AREA OR RECOVERY ROOM!!** ? ?IF YOU WILL BE ADMITTED INTO THE HOSPITAL YOU ARE ALLOWED ONLY TWO SUPPORT PEOPLE DURING VISITATION HOURS ONLY (7 AM -8PM)   ?The support person(s) must pass our screening, gel in and out, and wear a mask at all times, including in the patient?s room. ?Patients must also wear a mask when staff or their support person are in the room. ?Visitors GUEST BADGE MUST BE WORN VISIBLY  ?One adult visitor may remain with you overnight and MUST be in the room by 8 P.M. ?  ? ? Your procedure is scheduled on: 08/21/21 ? ? Report to [facility] Main Entrance ? ?  Report to admitting at 6:15 AM ? ? Call this number if you have problems the morning of surgery [phone] ? ? Do not eat food :After Midnight. ? ? After Midnight you may have the following liquids until 5:30 AM DAY OF SURGERY ? ?Water ?Black Coffee (sugar ok, NO MILK/CREAM OR CREAMERS)  ?Tea (sugar ok, NO MILK/CREAM OR CREAMERS) regular and decaf                             ?Plain Jell-O (NO RED)                                           ?Fruit ices (not with fruit pulp, NO RED)                                     ?Popsicles (NO RED)                                                                  ?Juice: apple, WHITE grape, WHITE cranberry ?Sports drinks like Gatorade (NO RED) ?Clear broth(vegetable,chicken,beef) ? ?             ?Drink 2 G2 drinks AT 10:00 PM the night before surgery.   ? ?  ?  ?The day of surgery:  ?Drink ONE (1) Pre-Surgery G2 at 5:30 AM the morning of surgery. Drink in one sitting. Do not sip.  ?This drink was given to you during your hospital  ?pre-op appointment visit. ?Nothing else to drink after completing the  ?Pre-Surgery G2. ?  ?       If you have questions, please contact your surgeon?s office. ? ? ?FOLLOW BOWEL  PREP AND ANY ADDITIONAL PRE OP INSTRUCTIONS YOU RECEIVED FROM YOUR SURGEON'S OFFICE!!! ?  ?  ?Oral Hygiene is also important to reduce your risk of infection.                                    ?Remember - BRUSH YOUR TEETH THE MORNING OF SURGERY WITH YOUR REGULAR TOOTHPASTE ? ?  Take these medicines the morning of surgery with A SIP OF WATER: Inhalers, Amlodipine, Wellbutrin, Carvedilol, Zetia, Hydralazine, Isosorbide, Sertraline.  ? ?DO NOT TAKE ANY ORAL DIABETIC MEDICATIONS DAY OF YOUR SURGERY ? ?How to Manage Your Diabetes ?Before and After Surgery ? ?Why is it important to control my blood sugar before and after surgery? ?Improving blood sugar levels before and after surgery helps healing and can limit problems. ?A way of improving blood sugar control is eating a healthy diet by: ? Eating less sugar and carbohydrates ? Increasing activity/exercise ? Talking with your doctor about reaching your blood sugar goals ?High blood sugars (greater than 180 mg/dL) can raise your risk of infections and slow your recovery, so you will need to focus on controlling your diabetes during the weeks before surgery. ?Make sure that the doctor who takes care of your diabetes knows about your planned surgery including the date and location. ? ?How do I manage my blood sugar before surgery? ?Check your blood sugar at least 4 times a day, starting 2 days before surgery, to make sure that the level is not too high or low. ?Check your blood sugar the morning of your surgery when you wake up and every 2 hours until you get to the Short Stay unit. ?If your blood sugar is less than 70 mg/dL, you will need to treat for low blood sugar: ?Do not take insulin. ?Treat a low blood sugar (less than 70 mg/dL) with ? cup of clear juice (cranberry or apple), 4 glucose tablets, OR glucose gel. ?Recheck blood sugar in 15 minutes after treatment (to make sure it is greater than 70 mg/dL). If your blood sugar is not greater than 70 mg/dL on recheck, call  [phone] for further instructions. ?Report your blood sugar to the short stay nurse when you get to Short Stay. ? ?If you are admitted to the hospital after surgery: ?Your blood sugar will be checked by the staff and you will probably be given insulin after surgery (instead of oral diabetes medicines) to make sure you have good blood sugar levels. ?The goal for blood sugar control after surgery is 80-180 mg/dL. ? ? ?WHAT DO I DO ABOUT MY DIABETES MEDICATION? ? ?Do not take oral diabetes medicines (pills) the morning of surgery. ? ?THE DAY BEFORE SURGERY, take Metformin and ACTOS as prescribed.      ? ? ?THE MORNING OF SURGERY, do not take Metformin or ACTOS. ? ?Reviewed and Endorsed by [facility] Patient Education Committee, August 2015  ? ?Bring CPAP mask and tubing day of surgery. ?                  ?           You may not have any metal on your body including hair pins, jewelry, and body piercing ? ?           Do not wear make-up, lotions, powders, perfumes, or deodorant ? ?Do not wear nail polish including gel and S&S, artificial/acrylic nails, or any other type of covering on natural nails including finger and toenails. If you have artificial nails, gel coating, etc. that needs to be removed by a nail salon please have this removed prior to surgery or surgery may need to be canceled/ delayed if the surgeon/ anesthesia feels like they are unable to be safely monitored.  ? ?Do not shave  48 hours prior to surgery.  ? ? Do not bring valuables to the hospital. [facility]  NOT ?            RESPONSIBLE   FOR VALUABLES. ? ? Contacts, dentures or bridgework may not be worn into surgery. ? ? Bring small overnight bag day of surgery. ?  ? Special Instructions: Bring a copy of your healthcare power of attorney and living will documents         the day of surgery if you haven't scanned them before. ? ?            Please read over the following fact sheets you were given: IF [place] [phone]- Apolonio Schneiders ? ? - Preparing for Surgery ?Before surgery, you can play an important role.  Because skin is not sterile, your skin needs to be as free of germs as possible.  You can reduce the number of germs on your skin by washing with CHG (chlorahexidine gluconate) soap before surgery.  CHG is an antiseptic cleaner which kills germs and bonds with the skin to continue killing germs even after washing. ?Please DO NOT use if you have an allergy to CHG or antibacterial soaps.  If your skin becomes reddened/irritated stop using the CHG and inform your nurse when you arrive at Short Stay. ?Do not shave (including legs and underarms) for at least 48 hours prior to the first CHG shower.  You may shave your face/neck. ? ?Please follow these instructions carefully: ? 1.  Shower with CHG Soap the night before surgery and the  morning of surgery. ? 2.  If you choose to wash your hair, wash your hair first as usual with your normal  shampoo. ? 3.  After you shampoo, rinse your hair and body thoroughly to remove the shampoo.                            ? 4.  Use CHG as you would any other liquid soap.  You can apply chg directly to the skin and wash.  Gently with a scrungie or clean washcloth. ? 5.  Apply the CHG Soap to your body ONLY FROM THE NECK DOWN.   Do   not use on face/ open      ?                     Wound or open sores. Avoid contact with eyes, ears mouth and   genitals (private parts).  ?                     Production manager,  Genitals (private parts) with your normal soap. ?            6.  Wash thoroughly, paying special attention to the area where your    surgery  will be performed. ? 7.  Thoroughly rinse your body with warm water from the neck down. ? 8.  DO NOT shower/wash with your normal soap after using and rinsing off the CHG Soap. ?               9.  Pat yourself dry with a clean towel. ?           10.  Wear clean pajamas. ?           11.  Place clean sheets on your bed  the night of your first shower and do not  sleep with pets. ?Day of Surgery : ?  Do not apply any lotions/deodorants the morning of surgery.  Please wear clean clothes to the hospital/surgery center. ? ?FAI

## 2021-08-11 NOTE — Progress Notes (Addendum)
COVID Vaccine Completed: yes x2 ?Date COVID Vaccine completed: ?Has received booster: ?COVID vaccine manufacturer: [employer]  ? ?Date of COVID positive in last 90 days: no ? ?PCP - Gildardo Cranker, MD ?Cardiologist - Tessa Lerner, DO ? ?Chest x-ray -  ?EKG - 06/11/21 Epic ?Stress Test - 05/22/20 Epic ?ECHO - 08/30/20 Epic ?Cardiac Cath - 2015 ?Pacemaker/ICD device last checked:n/a ?Spinal Cord Stimulator: n/a ? ?Bowel Prep - yes, patient has instructions and medications ? ?Sleep Study - yes, positive ?CPAP - yes every night ? ?Fasting Blood Sugar -  ?Checks Blood Sugar no checks at home currently, pt is borderline ? ?Blood Thinner Instructions: ?Aspirin Instructions: ASA 81, no instructions pt will call ?Last Dose: ? ?Activity level: Can go up a flight of stairs and perform activities of daily living without stopping and without symptoms of chest pain or shortness of breath. ?     ? ?Anesthesia review: CAD, HTN, COPD, DM2, CHF, OSA ? ?Patient denies shortness of breath, fever, cough and chest pain at PAT appointment ? ? ?Patient verbalized understanding of instructions that were given to them at the PAT appointment. Patient was also instructed that they will need to review over the PAT instructions again at home before surgery.  ?

## 2021-08-12 ENCOUNTER — Encounter (HOSPITAL_COMMUNITY): Payer: Self-pay

## 2021-08-12 ENCOUNTER — Encounter (HOSPITAL_COMMUNITY)
Admission: RE | Admit: 2021-08-12 | Discharge: 2021-08-12 | Disposition: A | Discharge status: Home / Self Care | Payer: No Typology Code available for payment source | Source: Ambulatory Visit | Attending: General Surgery | Admitting: General Surgery

## 2021-08-12 ENCOUNTER — Telehealth: Payer: Self-pay | Admitting: *Deleted

## 2021-08-12 VITALS — BP 136/70 | HR 56 | Temp 97.9°F | Resp 16 | Ht 65.0 in | Wt 231.4 lb

## 2021-08-12 DIAGNOSIS — Z955 Presence of coronary angioplasty implant and graft: Secondary | ICD-10-CM | POA: Insufficient documentation

## 2021-08-12 DIAGNOSIS — J449 Chronic obstructive pulmonary disease, unspecified: Secondary | ICD-10-CM | POA: Insufficient documentation

## 2021-08-12 DIAGNOSIS — Z87891 Personal history of nicotine dependence: Secondary | ICD-10-CM | POA: Diagnosis not present

## 2021-08-12 DIAGNOSIS — Z8679 Personal history of other diseases of the circulatory system: Secondary | ICD-10-CM | POA: Diagnosis not present

## 2021-08-12 DIAGNOSIS — Z6838 Body mass index (BMI) 38.0-38.9, adult: Secondary | ICD-10-CM | POA: Insufficient documentation

## 2021-08-12 DIAGNOSIS — I10 Essential (primary) hypertension: Secondary | ICD-10-CM | POA: Diagnosis not present

## 2021-08-12 DIAGNOSIS — Z01818 Encounter for other preprocedural examination: Secondary | ICD-10-CM

## 2021-08-12 DIAGNOSIS — I2510 Atherosclerotic heart disease of native coronary artery without angina pectoris: Secondary | ICD-10-CM | POA: Diagnosis present

## 2021-08-12 DIAGNOSIS — E669 Obesity, unspecified: Secondary | ICD-10-CM | POA: Diagnosis not present

## 2021-08-12 DIAGNOSIS — Z9049 Acquired absence of other specified parts of digestive tract: Secondary | ICD-10-CM | POA: Insufficient documentation

## 2021-08-12 DIAGNOSIS — K5732 Diverticulitis of large intestine without perforation or abscess without bleeding: Secondary | ICD-10-CM

## 2021-08-12 LAB — BASIC METABOLIC PANEL
Anion gap: 6 (ref 5–15)
BUN: 16 mg/dL (ref 8–23)
CO2: 30 mmol/L (ref 22–32)
Calcium: 9.2 mg/dL (ref 8.9–10.3)
Chloride: 103 mmol/L (ref 98–111)
Creatinine, Ser: 0.82 mg/dL (ref 0.44–1.00)
GFR, Estimated: >60 mL/min (ref >60)
Glucose, Bld: 133 mg/dL — ABNORMAL HIGH (ref 70–99)
Potassium: 4 mmol/L (ref 3.5–5.1)
Sodium: 139 mmol/L (ref 135–145)

## 2021-08-12 LAB — CBC
HCT: 40.1 % (ref 36.0–46.0)
Hemoglobin: 13.1 g/dL (ref 12.0–15.0)
MCH: 30.1 pg (ref 26.0–34.0)
MCHC: 32.7 g/dL (ref 30.0–36.0)
MCV: 92.2 fL (ref 80.0–100.0)
Platelets: 231 10*3/uL (ref 150–400)
RBC: 4.35 MIL/uL (ref 3.87–5.11)
RDW: 14.7 % (ref 11.5–15.5)
WBC: 7.8 10*3/uL (ref 4.0–10.5)
nRBC: 0 % (ref 0.0–0.2)

## 2021-08-12 LAB — HEMOGLOBIN A1C
Hgb A1c MFr Bld: 6.7 % — ABNORMAL HIGH (ref 4.8–5.6)
Mean Plasma Glucose: 145.59 mg/dL

## 2021-08-12 LAB — GLUCOSE, CAPILLARY: Glucose-Capillary: 128 mg/dL — ABNORMAL HIGH (ref 70–99)

## 2021-08-12 NOTE — Telephone Encounter (Signed)
Left message for pt to call back to schedule f/u lung screening CT scan.  ?
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
## 2021-08-21 ENCOUNTER — Encounter (HOSPITAL_COMMUNITY)
Admission: RE | Disposition: A | Discharge status: Home / Self Care | Payer: Self-pay | Source: Ambulatory Visit | Attending: General Surgery

## 2021-08-21 ENCOUNTER — Inpatient Hospital Stay (HOSPITAL_COMMUNITY): Payer: No Typology Code available for payment source | Admitting: Physician Assistant

## 2021-08-21 ENCOUNTER — Encounter (HOSPITAL_COMMUNITY): Payer: Self-pay | Admitting: General Surgery

## 2021-08-21 ENCOUNTER — Other Ambulatory Visit: Payer: Self-pay

## 2021-08-21 ENCOUNTER — Inpatient Hospital Stay (HOSPITAL_COMMUNITY): Payer: No Typology Code available for payment source | Admitting: Anesthesiology

## 2021-08-21 ENCOUNTER — Inpatient Hospital Stay (HOSPITAL_COMMUNITY)
Admission: RE | Admit: 2021-08-21 | Discharge: 2021-08-23 | DRG: 331 | Disposition: A | Discharge status: Home / Self Care | Payer: No Typology Code available for payment source | Source: Ambulatory Visit | Attending: General Surgery | Admitting: General Surgery

## 2021-08-21 DIAGNOSIS — Z87891 Personal history of nicotine dependence: Secondary | ICD-10-CM

## 2021-08-21 DIAGNOSIS — E119 Type 2 diabetes mellitus without complications: Secondary | ICD-10-CM | POA: Diagnosis present

## 2021-08-21 DIAGNOSIS — I10 Essential (primary) hypertension: Secondary | ICD-10-CM | POA: Diagnosis present

## 2021-08-21 DIAGNOSIS — Z8249 Family history of ischemic heart disease and other diseases of the circulatory system: Secondary | ICD-10-CM

## 2021-08-21 DIAGNOSIS — Z888 Allergy status to other drugs, medicaments and biological substances status: Secondary | ICD-10-CM

## 2021-08-21 DIAGNOSIS — Z7984 Long term (current) use of oral hypoglycemic drugs: Secondary | ICD-10-CM

## 2021-08-21 DIAGNOSIS — Z881 Allergy status to other antibiotic agents status: Secondary | ICD-10-CM

## 2021-08-21 DIAGNOSIS — K5792 Diverticulitis of intestine, part unspecified, without perforation or abscess without bleeding: Secondary | ICD-10-CM

## 2021-08-21 DIAGNOSIS — Z882 Allergy status to sulfonamides status: Secondary | ICD-10-CM | POA: Diagnosis not present

## 2021-08-21 DIAGNOSIS — I2510 Atherosclerotic heart disease of native coronary artery without angina pectoris: Secondary | ICD-10-CM | POA: Diagnosis not present

## 2021-08-21 DIAGNOSIS — K5732 Diverticulitis of large intestine without perforation or abscess without bleeding: Secondary | ICD-10-CM | POA: Diagnosis present

## 2021-08-21 DIAGNOSIS — Z83438 Family history of other disorder of lipoprotein metabolism and other lipidemia: Secondary | ICD-10-CM | POA: Diagnosis not present

## 2021-08-21 DIAGNOSIS — E785 Hyperlipidemia, unspecified: Secondary | ICD-10-CM | POA: Diagnosis present

## 2021-08-21 DIAGNOSIS — J449 Chronic obstructive pulmonary disease, unspecified: Secondary | ICD-10-CM | POA: Diagnosis present

## 2021-08-21 DIAGNOSIS — I509 Heart failure, unspecified: Secondary | ICD-10-CM

## 2021-08-21 DIAGNOSIS — Z79899 Other long term (current) drug therapy: Secondary | ICD-10-CM

## 2021-08-21 DIAGNOSIS — I110 Hypertensive heart disease with heart failure: Secondary | ICD-10-CM

## 2021-08-21 DIAGNOSIS — Z7982 Long term (current) use of aspirin: Secondary | ICD-10-CM

## 2021-08-21 HISTORY — PX: LAPAROSCOPIC PARTIAL COLECTOMY: SHX5907

## 2021-08-21 LAB — CBC
HCT: 40.9 % (ref 36.0–46.0)
Hemoglobin: 13.3 g/dL (ref 12.0–15.0)
MCH: 30.6 pg (ref 26.0–34.0)
MCHC: 32.5 g/dL (ref 30.0–36.0)
MCV: 94 fL (ref 80.0–100.0)
Platelets: 210 10*3/uL (ref 150–400)
RBC: 4.35 MIL/uL (ref 3.87–5.11)
RDW: 14.6 % (ref 11.5–15.5)
WBC: 16 10*3/uL — ABNORMAL HIGH (ref 4.0–10.5)
nRBC: 0 % (ref 0.0–0.2)

## 2021-08-21 LAB — GLUCOSE, CAPILLARY
Glucose-Capillary: 156 mg/dL — ABNORMAL HIGH (ref 70–99)
Glucose-Capillary: 190 mg/dL — ABNORMAL HIGH (ref 70–99)
Glucose-Capillary: 219 mg/dL — ABNORMAL HIGH (ref 70–99)
Glucose-Capillary: 185 mg/dL — ABNORMAL HIGH (ref 70–99)

## 2021-08-21 LAB — ABO/RH: ABO/RH(D): A POS

## 2021-08-21 LAB — CREATININE, SERUM
Creatinine, Ser: 0.71 mg/dL (ref 0.44–1.00)
GFR, Estimated: >60 mL/min (ref >60)

## 2021-08-21 LAB — TYPE AND SCREEN
ABO/RH(D): A POS
Antibody Screen: NEGATIVE

## 2021-08-21 SURGERY — LAPAROSCOPIC PARTIAL COLECTOMY
Anesthesia: General

## 2021-08-21 MED ORDER — SODIUM CHLORIDE 0.9 % IV SOLN
2.0000 g | INTRAVENOUS | Status: AC
  Administered 2021-08-21: 2 g via INTRAVENOUS
  Filled 2021-08-21: qty 2

## 2021-08-21 MED ORDER — CARVEDILOL 12.5 MG PO TABS
12.5000 mg | ORAL_TABLET | Freq: Two times a day (BID) | ORAL | Status: DC
Start: 2021-08-21 — End: 2021-08-23
  Administered 2021-08-21 – 2021-08-23 (×4): 12.5 mg via ORAL
  Filled 2021-08-21 (×4): qty 1

## 2021-08-21 MED ORDER — KETAMINE HCL 50 MG/5ML IJ SOSY
PREFILLED_SYRINGE | INTRAMUSCULAR | Status: AC
Start: 2021-08-21 — End: ?
  Filled 2021-08-21: qty 5

## 2021-08-21 MED ORDER — ONDANSETRON HCL 4 MG/2ML IJ SOLN
INTRAMUSCULAR | Status: AC
  Filled 2021-08-21: qty 2

## 2021-08-21 MED ORDER — ONDANSETRON HCL 4 MG/2ML IJ SOLN: 4.0000 mg | Freq: Four times a day (QID) | INTRAMUSCULAR | Status: DC | PRN

## 2021-08-21 MED ORDER — MORPHINE SULFATE (PF) 2 MG/ML IV SOLN: 2.0000 mg | INTRAVENOUS | Status: DC | PRN

## 2021-08-21 MED ORDER — METRONIDAZOLE 500 MG PO TABS: 1000.0000 mg | ORAL_TABLET | ORAL | Status: DC

## 2021-08-21 MED ORDER — NEOMYCIN SULFATE 500 MG PO TABS: 1000.0000 mg | ORAL_TABLET | ORAL | Status: DC

## 2021-08-21 MED ORDER — ONDANSETRON HCL 4 MG/2ML IJ SOLN
INTRAMUSCULAR | Status: DC | PRN
  Administered 2021-08-21: 4 mg via INTRAVENOUS

## 2021-08-21 MED ORDER — FENTANYL CITRATE PF 50 MCG/ML IJ SOSY: 25.0000 ug | PREFILLED_SYRINGE | INTRAMUSCULAR | Status: DC | PRN

## 2021-08-21 MED ORDER — BUPIVACAINE LIPOSOME 1.3 % IJ SUSP
INTRAMUSCULAR | Status: AC
  Filled 2021-08-21: qty 20

## 2021-08-21 MED ORDER — ORAL CARE MOUTH RINSE: 15.0000 mL | Freq: Once | OROMUCOSAL | Status: AC

## 2021-08-21 MED ORDER — MIDAZOLAM HCL 5 MG/5ML IJ SOLN
INTRAMUSCULAR | Status: DC | PRN
Start: 2021-08-21 — End: 2021-08-21
  Administered 2021-08-21: 2 mg via INTRAVENOUS

## 2021-08-21 MED ORDER — KETOROLAC TROMETHAMINE 30 MG/ML IJ SOLN
30.0000 mg | Freq: Once | INTRAMUSCULAR | Status: AC | PRN
  Administered 2021-08-21: 30 mg via INTRAVENOUS

## 2021-08-21 MED ORDER — ENOXAPARIN SODIUM 40 MG/0.4ML IJ SOSY
40.0000 mg | PREFILLED_SYRINGE | INTRAMUSCULAR | Status: DC
  Administered 2021-08-22 – 2021-08-23 (×2): 40 mg via SUBCUTANEOUS
  Filled 2021-08-21 (×2): qty 0.4

## 2021-08-21 MED ORDER — DEXAMETHASONE SODIUM PHOSPHATE 10 MG/ML IJ SOLN
INTRAMUSCULAR | Status: AC
Start: 2021-08-21 — End: ?
  Filled 2021-08-21: qty 1

## 2021-08-21 MED ORDER — HEPARIN SODIUM (PORCINE) 5000 UNIT/ML IJ SOLN
5000.0000 [IU] | Freq: Once | INTRAMUSCULAR | Status: AC
  Administered 2021-08-21: 5000 [IU] via SUBCUTANEOUS
  Filled 2021-08-21: qty 1

## 2021-08-21 MED ORDER — PROPOFOL 10 MG/ML IV BOLUS
INTRAVENOUS | Status: DC | PRN
  Administered 2021-08-21: 150 mg via INTRAVENOUS
  Administered 2021-08-21: 30 mg via INTRAVENOUS

## 2021-08-21 MED ORDER — AMLODIPINE BESYLATE 10 MG PO TABS
10.0000 mg | ORAL_TABLET | Freq: Every day | ORAL | Status: DC
  Administered 2021-08-22 – 2021-08-23 (×2): 10 mg via ORAL
  Filled 2021-08-21 (×2): qty 1

## 2021-08-21 MED ORDER — PROPOFOL 10 MG/ML IV BOLUS
INTRAVENOUS | Status: AC
  Filled 2021-08-21: qty 20

## 2021-08-21 MED ORDER — ALVIMOPAN 12 MG PO CAPS
12.0000 mg | ORAL_CAPSULE | Freq: Two times a day (BID) | ORAL | Status: DC
  Administered 2021-08-22 (×2): 12 mg via ORAL
  Filled 2021-08-21 (×2): qty 1

## 2021-08-21 MED ORDER — BUPIVACAINE LIPOSOME 1.3 % IJ SUSP: 20.0000 mL | Freq: Once | INTRAMUSCULAR | Status: DC

## 2021-08-21 MED ORDER — ALVIMOPAN 12 MG PO CAPS
12.0000 mg | ORAL_CAPSULE | ORAL | Status: AC
  Administered 2021-08-21: 12 mg via ORAL
  Filled 2021-08-21: qty 1

## 2021-08-21 MED ORDER — ALBUTEROL SULFATE (2.5 MG/3ML) 0.083% IN NEBU: 3.0000 mL | INHALATION_SOLUTION | Freq: Four times a day (QID) | RESPIRATORY_TRACT | Status: DC | PRN

## 2021-08-21 MED ORDER — METOPROLOL TARTRATE 5 MG/5ML IV SOLN: 5.0000 mg | Freq: Four times a day (QID) | INTRAVENOUS | Status: DC | PRN

## 2021-08-21 MED ORDER — BUPIVACAINE LIPOSOME 1.3 % IJ SUSP
INTRAMUSCULAR | Status: DC | PRN
  Administered 2021-08-21: 20 mL

## 2021-08-21 MED ORDER — ENSURE PRE-SURGERY PO LIQD: 592.0000 mL | Freq: Once | ORAL | Status: DC

## 2021-08-21 MED ORDER — ACETAMINOPHEN 500 MG PO TABS
1000.0000 mg | ORAL_TABLET | ORAL | Status: AC
  Administered 2021-08-21: 1000 mg via ORAL
  Filled 2021-08-21: qty 2

## 2021-08-21 MED ORDER — LACTATED RINGERS IV SOLN
INTRAVENOUS | Status: DC
Start: 2021-08-21 — End: 2021-08-21

## 2021-08-21 MED ORDER — LACTATED RINGERS IV SOLN: INTRAVENOUS | Status: DC | PRN

## 2021-08-21 MED ORDER — AMISULPRIDE (ANTIEMETIC) 5 MG/2ML IV SOLN: 10.0000 mg | Freq: Once | INTRAVENOUS | Status: DC | PRN

## 2021-08-21 MED ORDER — HYDRALAZINE HCL 25 MG PO TABS
25.0000 mg | ORAL_TABLET | Freq: Three times a day (TID) | ORAL | Status: DC
  Administered 2021-08-21 – 2021-08-23 (×6): 25 mg via ORAL
  Filled 2021-08-21 (×6): qty 1

## 2021-08-21 MED ORDER — ROCURONIUM BROMIDE 10 MG/ML (PF) SYRINGE
PREFILLED_SYRINGE | INTRAVENOUS | Status: AC
  Filled 2021-08-21: qty 10

## 2021-08-21 MED ORDER — OXYCODONE HCL 5 MG PO TABS: 5.0000 mg | ORAL_TABLET | Freq: Once | ORAL | Status: DC | PRN

## 2021-08-21 MED ORDER — GABAPENTIN 300 MG PO CAPS
300.0000 mg | ORAL_CAPSULE | ORAL | Status: AC
  Administered 2021-08-21: 300 mg via ORAL
  Filled 2021-08-21: qty 1

## 2021-08-21 MED ORDER — CHLORHEXIDINE GLUCONATE 4 % EX LIQD: 1.0000 "application " | Freq: Once | CUTANEOUS | Status: DC

## 2021-08-21 MED ORDER — METFORMIN HCL ER 500 MG PO TB24
500.0000 mg | ORAL_TABLET | Freq: Every day | ORAL | Status: DC
  Filled 2021-08-21 (×2): qty 1

## 2021-08-21 MED ORDER — ONDANSETRON HCL 4 MG PO TABS: 4.0000 mg | ORAL_TABLET | Freq: Four times a day (QID) | ORAL | Status: DC | PRN

## 2021-08-21 MED ORDER — LIDOCAINE 2% (20 MG/ML) 5 ML SYRINGE
INTRAMUSCULAR | Status: DC | PRN
  Administered 2021-08-21: 60 mg via INTRAVENOUS
  Administered 2021-08-21: 40 mg via INTRAVENOUS

## 2021-08-21 MED ORDER — ROCURONIUM BROMIDE 10 MG/ML (PF) SYRINGE
PREFILLED_SYRINGE | INTRAVENOUS | Status: DC | PRN
  Administered 2021-08-21 (×2): 50 mg via INTRAVENOUS

## 2021-08-21 MED ORDER — GLYCOPYRROLATE 0.2 MG/ML IJ SOLN
INTRAMUSCULAR | Status: DC | PRN
  Administered 2021-08-21: .2 mg via INTRAVENOUS

## 2021-08-21 MED ORDER — MIDAZOLAM HCL 2 MG/2ML IJ SOLN
INTRAMUSCULAR | Status: AC
  Filled 2021-08-21: qty 2

## 2021-08-21 MED ORDER — LIDOCAINE HCL (PF) 2 % IJ SOLN
INTRAMUSCULAR | Status: AC
  Filled 2021-08-21: qty 5

## 2021-08-21 MED ORDER — BUPROPION HCL ER (XL) 150 MG PO TB24
150.0000 mg | ORAL_TABLET | Freq: Every day | ORAL | Status: DC
  Administered 2021-08-22 – 2021-08-23 (×2): 150 mg via ORAL
  Filled 2021-08-21 (×2): qty 1

## 2021-08-21 MED ORDER — TRAZODONE HCL 100 MG PO TABS
200.0000 mg | ORAL_TABLET | Freq: Every day | ORAL | Status: DC
  Administered 2021-08-21 – 2021-08-22 (×2): 200 mg via ORAL
  Filled 2021-08-21 (×2): qty 2

## 2021-08-21 MED ORDER — CHLORHEXIDINE GLUCONATE 0.12 % MT SOLN
15.0000 mL | Freq: Once | OROMUCOSAL | Status: AC
  Administered 2021-08-21: 15 mL via OROMUCOSAL

## 2021-08-21 MED ORDER — FENTANYL CITRATE (PF) 100 MCG/2ML IJ SOLN
INTRAMUSCULAR | Status: DC | PRN
  Administered 2021-08-21 (×2): 50 ug via INTRAVENOUS

## 2021-08-21 MED ORDER — FENTANYL CITRATE (PF) 100 MCG/2ML IJ SOLN
INTRAMUSCULAR | Status: AC
  Filled 2021-08-21: qty 2

## 2021-08-21 MED ORDER — EPHEDRINE SULFATE-NACL 50-0.9 MG/10ML-% IV SOSY
PREFILLED_SYRINGE | INTRAVENOUS | Status: DC | PRN
  Administered 2021-08-21: 10 mg via INTRAVENOUS
  Administered 2021-08-21: 5 mg via INTRAVENOUS

## 2021-08-21 MED ORDER — DEXAMETHASONE SODIUM PHOSPHATE 10 MG/ML IJ SOLN
INTRAMUSCULAR | Status: DC | PRN
  Administered 2021-08-21: 10 mg via INTRAVENOUS

## 2021-08-21 MED ORDER — KETOROLAC TROMETHAMINE 30 MG/ML IJ SOLN
INTRAMUSCULAR | Status: AC
  Filled 2021-08-21: qty 1

## 2021-08-21 MED ORDER — SODIUM CHLORIDE 0.45 % IV SOLN: INTRAVENOUS | Status: DC

## 2021-08-21 MED ORDER — SERTRALINE HCL 100 MG PO TABS
100.0000 mg | ORAL_TABLET | Freq: Every day | ORAL | Status: DC
Start: 2021-08-22 — End: 2021-08-23
  Administered 2021-08-22 – 2021-08-23 (×2): 100 mg via ORAL
  Filled 2021-08-21 (×2): qty 1

## 2021-08-21 MED ORDER — KETAMINE HCL 10 MG/ML IJ SOLN
INTRAMUSCULAR | Status: DC | PRN
  Administered 2021-08-21: 50 mg via INTRAVENOUS

## 2021-08-21 MED ORDER — EPHEDRINE 5 MG/ML INJ
INTRAVENOUS | Status: AC
  Filled 2021-08-21: qty 5

## 2021-08-21 MED ORDER — SUGAMMADEX SODIUM 200 MG/2ML IV SOLN
INTRAVENOUS | Status: DC | PRN
Start: 2021-08-21 — End: 2021-08-21
  Administered 2021-08-21: 200 mg via INTRAVENOUS

## 2021-08-21 MED ORDER — BUPIVACAINE-EPINEPHRINE 0.25% -1:200000 IJ SOLN
INTRAMUSCULAR | Status: DC | PRN
  Administered 2021-08-21: 30 mL

## 2021-08-21 MED ORDER — ISOSORBIDE DINITRATE 20 MG PO TABS
20.0000 mg | ORAL_TABLET | Freq: Three times a day (TID) | ORAL | Status: DC
  Administered 2021-08-21 – 2021-08-23 (×6): 20 mg via ORAL
  Filled 2021-08-21 (×7): qty 1

## 2021-08-21 MED ORDER — SODIUM CHLORIDE 0.9 % IR SOLN
Status: DC | PRN
Start: 2021-08-21 — End: 2021-08-21
  Administered 2021-08-21: 1000 mL

## 2021-08-21 MED ORDER — HYDROCHLOROTHIAZIDE 25 MG PO TABS
25.0000 mg | ORAL_TABLET | Freq: Every day | ORAL | Status: DC
Start: 2021-08-21 — End: 2021-08-23
  Administered 2021-08-22 – 2021-08-23 (×2): 25 mg via ORAL
  Filled 2021-08-21 (×2): qty 1

## 2021-08-21 MED ORDER — ENSURE SURGERY PO LIQD: 237.0000 mL | Freq: Two times a day (BID) | ORAL | Status: DC

## 2021-08-21 MED ORDER — LACTATED RINGERS IR SOLN
Status: DC | PRN
  Administered 2021-08-21: 1000 mL

## 2021-08-21 MED ORDER — OXYCODONE HCL 5 MG/5ML PO SOLN: 5.0000 mg | Freq: Once | ORAL | Status: DC | PRN

## 2021-08-21 MED ORDER — ENSURE PRE-SURGERY PO LIQD: 296.0000 mL | Freq: Once | ORAL | Status: DC

## 2021-08-21 MED ORDER — OXYCODONE HCL 5 MG PO TABS
5.0000 mg | ORAL_TABLET | Freq: Four times a day (QID) | ORAL | Status: DC | PRN
  Administered 2021-08-21 – 2021-08-22 (×3): 5 mg via ORAL
  Filled 2021-08-21 (×3): qty 1

## 2021-08-21 MED ORDER — BUPIVACAINE-EPINEPHRINE (PF) 0.25% -1:200000 IJ SOLN
INTRAMUSCULAR | Status: AC
  Filled 2021-08-21: qty 30

## 2021-08-21 MED ORDER — PHENYLEPHRINE HCL-NACL 20-0.9 MG/250ML-% IV SOLN
INTRAVENOUS | Status: AC
Start: 2021-08-21 — End: 2021-08-21
  Filled 2021-08-21: qty 250

## 2021-08-21 MED ORDER — LOSARTAN POTASSIUM 50 MG PO TABS
100.0000 mg | ORAL_TABLET | Freq: Every day | ORAL | Status: DC
  Filled 2021-08-21: qty 2

## 2021-08-21 MED ORDER — INSULIN ASPART 100 UNIT/ML IJ SOLN
0.0000 [IU] | INTRAMUSCULAR | Status: DC
  Administered 2021-08-21: 3 [IU] via SUBCUTANEOUS
  Administered 2021-08-21: 5 [IU] via SUBCUTANEOUS
  Administered 2021-08-22: 3 [IU] via SUBCUTANEOUS
  Administered 2021-08-22 (×5): 2 [IU] via SUBCUTANEOUS

## 2021-08-21 SURGICAL SUPPLY — 75 items
APPLIER CLIP 5 13 M/L LIGAMAX5 (MISCELLANEOUS)
APPLIER CLIP ROT 10 11.4 M/L (STAPLE)
BAG COUNTER SPONGE SURGICOUNT (BAG) IMPLANT
BENZOIN TINCTURE PRP APPL 2/3 (GAUZE/BANDAGES/DRESSINGS) IMPLANT
BLADE EXTENDED COATED 6.5IN (ELECTRODE) IMPLANT
BNDG ADH 1X3 SHEER STRL LF (GAUZE/BANDAGES/DRESSINGS) IMPLANT
CABLE HIGH FREQUENCY MONO STRZ (ELECTRODE) IMPLANT
CELLS DAT CNTRL 66122 CELL SVR (MISCELLANEOUS) IMPLANT
CHLORAPREP W/TINT 26 (MISCELLANEOUS) ×2 IMPLANT
CLIP APPLIE 5 13 M/L LIGAMAX5 (MISCELLANEOUS) IMPLANT
CLIP APPLIE ROT 10 11.4 M/L (STAPLE) IMPLANT
COVER SURGICAL LIGHT HANDLE (MISCELLANEOUS) ×2 IMPLANT
DRAIN CHANNEL 19F RND (DRAIN) IMPLANT
DRAPE UTILITY XL STRL (DRAPES) IMPLANT
DRSG OPSITE POSTOP 4X10 (GAUZE/BANDAGES/DRESSINGS) IMPLANT
DRSG OPSITE POSTOP 4X6 (GAUZE/BANDAGES/DRESSINGS) ×1 IMPLANT
DRSG OPSITE POSTOP 4X8 (GAUZE/BANDAGES/DRESSINGS) IMPLANT
ELECT REM PT RETURN 15FT ADLT (MISCELLANEOUS) ×2 IMPLANT
ENDOLOOP SUT PDS II  0 18 (SUTURE)
ENDOLOOP SUT PDS II 0 18 (SUTURE) IMPLANT
GAUZE SPONGE 4X4 12PLY STRL (GAUZE/BANDAGES/DRESSINGS) IMPLANT
GLOVE BIOGEL PI IND STRL 7.0 (GLOVE) ×2 IMPLANT
GLOVE BIOGEL PI INDICATOR 7.0 (GLOVE) ×2
GLOVE SURG SS PI 7.0 STRL IVOR (GLOVE) ×4 IMPLANT
GOWN STRL REUS W/ TWL XL LVL3 (GOWN DISPOSABLE) IMPLANT
GOWN STRL REUS W/TWL XL LVL3 (GOWN DISPOSABLE)
IRRIG SUCT STRYKERFLOW 2 WTIP (MISCELLANEOUS) ×2
IRRIGATION SUCT STRKRFLW 2 WTP (MISCELLANEOUS) ×1 IMPLANT
KIT TURNOVER KIT A (KITS) IMPLANT
PACK COLON (CUSTOM PROCEDURE TRAY) ×2 IMPLANT
PAD POSITIONING PINK XL (MISCELLANEOUS) IMPLANT
PENCIL SMOKE EVACUATOR (MISCELLANEOUS) ×2 IMPLANT
PROTECTOR NERVE ULNAR (MISCELLANEOUS) IMPLANT
RELOAD STAPLE 60 2.6 WHT THN (STAPLE) IMPLANT
RELOAD STAPLE 60 3.6 BLU REG (STAPLE) IMPLANT
RELOAD STAPLER BLUE 60MM (STAPLE) ×2 IMPLANT
RELOAD STAPLER WHITE 60MM (STAPLE) IMPLANT
RETRACTOR WND ALEXIS 18 MED (MISCELLANEOUS) IMPLANT
RTRCTR WOUND ALEXIS 18CM MED (MISCELLANEOUS)
SCISSORS LAP 5X35 DISP (ENDOMECHANICALS) ×2 IMPLANT
SEALER TISSUE G2 STRG ARTC 35C (ENDOMECHANICALS) ×2 IMPLANT
SET TUBE SMOKE EVAC HIGH FLOW (TUBING) ×2 IMPLANT
SLEEVE XCEL OPT CAN 5 100 (ENDOMECHANICALS) ×4 IMPLANT
SLEEVE Z-THREAD 5X100MM (TROCAR) ×2 IMPLANT
SPIKE FLUID TRANSFER (MISCELLANEOUS) ×2 IMPLANT
SPONGE T-LAP 18X18 ~~LOC~~+RFID (SPONGE) ×2 IMPLANT
STAPLER ECHELON LONG 3000 60 (ENDOMECHANICALS) ×1 IMPLANT
STAPLER ECHELON LONG 60 440 (INSTRUMENTS) IMPLANT
STAPLER RELOAD BLUE 60MM (STAPLE) ×4
STAPLER RELOAD WHITE 60MM (STAPLE)
STAPLER VISISTAT 35W (STAPLE) IMPLANT
STRIP CLOSURE SKIN 1/2X4 (GAUZE/BANDAGES/DRESSINGS) IMPLANT
SUT MON AB 4-0 SH 27 (SUTURE) ×1 IMPLANT
SUT PDS AB 0 CT1 36 (SUTURE) ×3 IMPLANT
SUT PROLENE 2 0 KS (SUTURE) ×1 IMPLANT
SUT SILK 2 0 (SUTURE) ×2
SUT SILK 2 0 SH CR/8 (SUTURE) ×2 IMPLANT
SUT SILK 2-0 18XBRD TIE 12 (SUTURE) ×1 IMPLANT
SUT SILK 3 0 (SUTURE) ×2
SUT SILK 3 0 SH CR/8 (SUTURE) ×2 IMPLANT
SUT SILK 3-0 18XBRD TIE 12 (SUTURE) ×1 IMPLANT
SUT VIC AB 2-0 SH 27 (SUTURE) ×2
SUT VIC AB 2-0 SH 27X BRD (SUTURE) ×1 IMPLANT
SUT VICRYL 0 ENDOLOOP (SUTURE) IMPLANT
SYS LAPSCP GELPORT 120MM (MISCELLANEOUS)
SYS WOUND ALEXIS 18CM MED (MISCELLANEOUS) ×2
SYSTEM LAPSCP GELPORT 120MM (MISCELLANEOUS) IMPLANT
SYSTEM WOUND ALEXIS 18CM MED (MISCELLANEOUS) IMPLANT
TAPE CLOTH 4X10 WHT NS (GAUZE/BANDAGES/DRESSINGS) IMPLANT
TOWEL OR NON WOVEN STRL DISP B (DISPOSABLE) ×2 IMPLANT
TRAY FOLEY MTR SLVR 16FR STAT (SET/KITS/TRAYS/PACK) IMPLANT
TROCAR ADV FIXATION 5X100MM (TROCAR) ×1 IMPLANT
TROCAR BLADELESS OPT 5 100 (ENDOMECHANICALS) ×2 IMPLANT
TROCAR UNIVERSAL OPT 12M 100M (ENDOMECHANICALS) ×1 IMPLANT
TROCAR XCEL 12X100 BLDLESS (ENDOMECHANICALS) ×2 IMPLANT

## 2021-08-21 NOTE — Transfer of Care (Signed)
Immediate Anesthesia Transfer of Care Note ? ?Patient: Karina Oneal ? ?Procedure(s) Performed: LAPAROSCOPIC SIGMOID COLECTOMY WITH ANASTOMOSIS, BILATERAL TAP BLOCK ? ?Patient Location: PACU ? ?Anesthesia Type:General ? ?Level of Consciousness: awake, alert  and oriented ? ?Airway & Oxygen Therapy: Patient Spontanous Breathing ? ?Post-op Assessment: Report given to RN and Post -op Vital signs reviewed and stable ? ?Post vital signs: Reviewed and stable ? ?Last Vitals:  ?Vitals Value Taken Time  ?BP 144/62 08/21/21 1141  ?Temp    ?Pulse 65 08/21/21 1145  ?Resp 14 08/21/21 1145  ?SpO2 94 % 08/21/21 1145  ?Vitals shown include unvalidated device data. ? ?Last Pain:  ?Vitals:  ? 08/21/21 0650  ?TempSrc:   ?PainSc: 1   ?   ? ?Patients Stated Pain Goal: 0 (08/21/21 0650) ? ?Complications: No notable events documented. ?

## 2021-08-21 NOTE — Op Note (Signed)
Preoperative diagnosis: diverticulitis ? ?Postoperative diagnosis: same  ? ?Procedure: laparoscopic sigmoid colon resection with anastomosis ? ?Surgeon: Feliciana Rossetti, M.D. ? ?Asst: Axel Filler, M.D. ? ?Anesthesia: General ? ?Indications for procedure: Karina Oneal is a 66 y.o. year old female with symptoms of abdominal pain and recurrent diverticulitis. We discussed options for treatment as well as risks and benefits of surgery. She presents for colon resection. ? ?Description of procedure: The patient was brought into the operative suite. Anesthesia was administered with General endotracheal anesthesia. WHO checklist was applied. The patient was then placed in lithotomy position. The area was prepped and draped in the usual sterile fashion. ? ?A left subcostal incision was made. A 49mm trocar was used to gain access to the peritoneal cavity by optical entry technique. Pneumoperitoneum was applied with a high flow and low pressure. The laparoscope was reinserted to confirm position. A right lateral 5 mm trocar and right lower 12 mm trocar was placed. A 5 mm trocar was placed in the right upper quadrant area. ? ?The colon was identified. It appeared chronically inflamed and adhered to the lateral abdominal wall. Adhesions of the colon to the abdominal wall were lysed with harmonic scalpel. The medial peritoneal reflection was entered, the vessels were identified. Further dissection of the retroperitoneum allowed identification of the ureter and gonadal vessels. The mesentery of the colon was then taken with harmonic scalpel. This was continued distally until the rectosigmoid junction. A portion beyond inflammation at the rectosigmoid junction was chosen for division. A blue 60 mm echelon linear stapler was used to divide the large intestine. Total lysis of adhesion time was 20 minutes. ? ?Next, The white line of [place] was incised proximally and the left colon mobilized from the abdominal wall. A location of the  proximal sigmoid colon was chosen for division. A blue 60 mm echelon linear stapler was used to divide the colon proximally. ? ?Next, a left lower quadrant incision was made. Cautery was used to dissect through the subcutaneous tissue and the fascia was incised. A wound protector was put in place. The sigmoid colon specimen was removed. The proximal colon was extracted. A pursestring device was used to pursestring the colon with 2-0 prolene. The staple line was removed. The colon was dilated and a 29 mm anvil was chosen and pursestring was tied down around the anvil. The colon was placed back into the abdomen. Insufflation was resumed. ? ? ?The rectum was inspected and dilator passed up to the staple line. The stapler was introduced and spike brought out at middle of the staple line. The anvil was connected and the stapler was clamped. Anastomosis was performed and stapler removed. Rigid proctoscopy was used to leak test.  No bubbles were seen. The abdomen and pelvis were irrigated and fluid removed. Pneumoperitoneum was evacuated. The fascia was closed with 0 PDS in running fashion for the extraction site. Skin incisions were closed with 4-0 monocryl subcuticular stitch. Dermabond was put in place for dressings. [place] was placed over the extraction site ? ?Findings: chronic diverticulitis, intact anastomosis ? ?Specimen: sigmoid colon ? ?Implant: none  ? ?Blood loss: 100 ml ? ?Local anesthesia:  50 ml Exparel:Marcaine Mix ? ?Complications: none ? ?Feliciana Rossetti, M.D. ?General, Bariatric, & Minimally Invasive Surgery ?[facility], [place] ? ?

## 2021-08-21 NOTE — Anesthesia Preprocedure Evaluation (Addendum)
Anesthesia Evaluation  ?Patient identified by MRN, date of birth, ID band ?Patient awake ? ? ? ?Reviewed: ?Allergy & Precautions, NPO status , Patient's Chart, lab work & pertinent test results ? ?Airway ?Mallampati: II ? ?TM Distance: >3 FB ?Neck ROM: Full ? ? ? Dental ?no notable dental hx. ?(+) Missing ?  ?Pulmonary ?COPD,  COPD inhaler, Patient abstained from smoking., former smoker,  ?  ?Pulmonary exam normal ? ? ? ? ? ? ? Cardiovascular ?hypertension, Pt. on home beta blockers and Pt. on medications ?+ CAD, + Cardiac Stents and +CHF  ?Normal cardiovascular exam ? ?ECG: rate 52 ?  ?Neuro/Psych ?negative neurological ROS ?   ? GI/Hepatic ?negative GI ROS, Neg liver ROS,   ?Endo/Other  ?diabetes, Oral Hypoglycemic Agents ? Renal/GU ?negative Renal ROS  ? ?  ?Musculoskeletal ?negative musculoskeletal ROS ?(+)  ? Abdominal ?(+) + obese,   ?Peds ? Hematology ?negative hematology ROS ?(+)   ?Anesthesia Other Findings ?DIVERTICULITIS ? Reproductive/Obstetrics ? ?  ? ? ? ? ? ? ? ? ? ? ? ? ? ?  ?  ? ? ? ? ? ? ? ?Anesthesia Physical ?Anesthesia Plan ? ?ASA: 3 ? ?Anesthesia Plan: General  ? ?Post-op Pain Management:   ? ?Induction: Intravenous ? ?PONV Risk Score and Plan: 4 or greater and Ondansetron, Dexamethasone, Midazolam, Propofol infusion and Treatment may vary due to age or medical condition ? ?Airway Management Planned: Oral ETT ? ?Additional Equipment:  ? ?Intra-op Plan:  ? ?Post-operative Plan: Extubation in OR ? ?Informed Consent: I have reviewed the patients History and Physical, chart, labs and discussed the procedure including the risks, benefits and alternatives for the proposed anesthesia with the patient or authorized representative who has indicated his/her understanding and acceptance.  ? ? ? ?Dental advisory given ? ?Plan Discussed with: CRNA ? ?Anesthesia Plan Comments:   ? ? ? ? ? ?Anesthesia Quick Evaluation ? ?

## 2021-08-21 NOTE — Anesthesia Procedure Notes (Signed)
Procedure Name: Intubation ?Date/Time: 08/21/2021 8:32 AM ?Performed by: Rosaland Lao, CRNA ?Pre-anesthesia Checklist: Patient identified, Emergency Drugs available, Suction available and Patient being monitored ?Patient Re-evaluated:Patient Re-evaluated prior to induction ?Oxygen Delivery Method: Circle system utilized ?Preoxygenation: Pre-oxygenation with 100% oxygen ?Induction Type: IV induction ?Ventilation: Mask ventilation without difficulty ?Laryngoscope Size: Sabra Heck ?Grade View: Grade II ?Tube type: Oral ?Tube size: 7.0 mm ?Number of attempts: 1 ?Airway Equipment and Method: Stylet ?Placement Confirmation: ETT inserted through vocal cords under direct vision, positive ETCO2 and breath sounds checked- equal and bilateral ?Secured at: 22 cm ?Tube secured with: Tape ?Dental Injury: Teeth and Oropharynx as per pre-operative assessment  ? ? ? ? ?

## 2021-08-21 NOTE — H&P (Signed)
Chief Complaint: Diverticulitis ? ?History of Present Illness: ?Karina Oneal is a 66 y.o. female who is seen today as an office consultation for evaluation of Diverticulitis ? ?She has a long history of diverticulitis. She has never been hospitalized for it. It was diagnosed on colonoscopy. She has had multiple episodes requiring antibiotics. She has left sided pain that wraps to the suprapubic area. ? ?Review of Systems: ?A complete review of systems was obtained from the patient. I have reviewed this information and discussed as appropriate with the patient. See HPI as well for other ROS. ? ?Review of Systems  ?Constitutional: Negative.  ?HENT: Negative.  ?Eyes: Negative.  ?Respiratory: Negative.  ?Cardiovascular: Negative.  ?Gastrointestinal: Negative.  ?Genitourinary: Negative.  ?Musculoskeletal: Negative.  ?Skin: Negative.  ?Neurological: Negative.  ?Endo/Heme/Allergies: Negative.  ?Psychiatric/Behavioral: Negative.  ? ?Medical History: ?Past Medical History:  ?Diagnosis Date  ? COPD (chronic obstructive pulmonary disease) (CMS-HCC)  ? Diabetes mellitus without complication (CMS-HCC)  ? Hyperlipidemia  ? Hypertension  ? ?There is no problem list on file for this patient. ? ?History reviewed. No pertinent surgical history.  ? ?Allergies  ?Allergen Reactions  ? Ace Inhibitors Cough  ? Canagliflozin Other (See Comments)  ? Ciprocinonide Vomiting  ? Ciprofloxacin Nausea And Vomiting  ? Simvastatin Other (See Comments)  ? Sulfa (Sulfonamide Antibiotics) Itching  ? ?Current Outpatient Medications on File Prior to Visit  ?Medication Sig Dispense Refill  ? amLODIPine (NORVASC) 10 MG tablet 1 tablet  ? buPROPion (WELLBUTRIN XL) 150 MG XL tablet Take 150 mg by mouth every morning  ? carvediloL (COREG) 12.5 MG tablet 1 tablet with food  ? evolocumab (REPATHA SURECLICK) 140 mg/mL PnIj Inject subcutaneously  ? hydrALAZINE (APRESOLINE) 25 MG tablet 1 tablet with food  ? hydroCHLOROthiazide (HYDRODIURIL) 25 MG tablet Take 1  tablet by mouth once daily  ? isosorbide dinitrate (ISORDIL) 20 MG tablet 1 tablet  ? losartan (COZAAR) 100 MG tablet 1 tablet  ? metFORMIN (GLUCOPHAGE-XR) 500 MG XR tablet TAKE 2 TABLETS BY MOUTH WITH EVENING MEAL DAILY  ? pioglitazone (ACTOS) 15 MG tablet 1 tablet  ? sertraline (ZOLOFT) 100 MG tablet Take 100 mg by mouth once daily  ? albuterol 90 mcg/actuation inhaler Inhale into the lungs  ? ascorbic acid, vitamin C, (VITAMIN C) 100 MG tablet Take 100 mg by mouth once daily  ? aspirin 81 MG EC tablet Take 81 mg by mouth once daily  ? cholecalciferol (VITAMIN D3) 1000 unit tablet Take by mouth  ? ezetimibe (ZETIA) 10 mg tablet  ? multivitamin tablet Take 1 tablet by mouth once daily  ? nitroGLYcerin (NITROSTAT) 0.4 MG SL tablet Place 0.4 mg under the tongue every 5 (five) minutes as needed  ? traZODone (DESYREL) 100 MG tablet Take 200 mg by mouth at bedtime  ? ?No current facility-administered medications on file prior to visit.  ? ?Family History  ?Problem Relation Age of Onset  ? Obesity Mother  ? High blood pressure (Hypertension) Mother  ? Hyperlipidemia (Elevated cholesterol) Mother  ? Coronary Artery Disease (Blocked arteries around heart) Mother  ? Obesity Father  ? Hyperlipidemia (Elevated cholesterol) Father  ? ? ?Social History  ? ?Tobacco Use  ?Smoking Status Former  ? Types: Cigarettes  ? Quit date: 10/2020  ? Years since quitting: 0.6  ?Smokeless Tobacco Never  ? ? ?Social History  ? ?Socioeconomic History  ? Marital status: Divorced  ?Tobacco Use  ? Smoking status: Former  ?Types: Cigarettes  ?Quit date: 10/2020  ?Years since  quitting: 0.6  ? Smokeless tobacco: Never  ?Substance and Sexual Activity  ? Drug use: Never  ? ?Objective:  ? ?Vitals:  ?07/02/21 1111  ?Pulse: 73  ?Temp: 36.6 ?C (97.9 ?F)  ?SpO2: 97%  ?Weight: (!) 104.6 kg (230 lb 9.6 oz)  ?Height: 165.1 cm (5\' 5" )  ? ?Body mass index is 38.37 kg/m?. ? ?Physical Exam ?Constitutional:  ?Appearance: Normal appearance.  ?HENT:  ?Head:  Normocephalic and atraumatic.  ?Pulmonary:  ?Effort: Pulmonary effort is normal.  ?Musculoskeletal:  ?General: Normal range of motion.  ?Cervical back: Normal range of motion.  ?Neurological:  ?General: No focal deficit present.  ?Mental Status: She is alert and oriented to person, place, and time. Mental status is at baseline.  ?Psychiatric:  ?Mood and Affect: Mood normal.  ?Behavior: Behavior normal.  ?Thought Content: Thought content normal.  ? ?Labs, Imaging and Diagnostic Testing: ?I reviewed images of her CT scan showing focal inflammation of the sigmoid colon without abscess. ? ?Assessment and Plan:  ? ?Diagnoses and all orders for this visit: ? ?Diverticulitis of large intestine without perforation or abscess without bleeding ? ?H/O laparoscopic adjustable gastric banding ? ?The anatomy & physiology of the digestive tract was discussed. The pathophysiology of the colon was discussed. Natural history risks without surgery was discussed. I recommended a partial colectomy to remove the pathology. Minimally invasive (Robotic/Laparoscopic) & open techniques were discussed.  ? ?Risks such as bleeding, infection, abscess, leak, reoperation, injury to other organs, need for repair of tissues / organs, possible ostomy, hernia, heart attack, stroke, death, and other risks were discussed. I noted a good likelihood this will help address the problem. Goals of post-operative recovery were discussed as well. Need for adequate nutrition, daily bowel regimen and healthy physical activity, to optimize recovery was noted as well. We will work to minimize complications. Educational materials were available as well. Questions were answered. The patient expresses understanding & wishes to proceed with surgery. ?  ?

## 2021-08-21 NOTE — Plan of Care (Signed)
?  Problem: Education: ?Goal: Knowledge of General Education information will improve ?Description: Including pain rating scale, medication(s)/side effects and non-pharmacologic comfort measures ?Outcome: Progressing ?  ?Problem: Activity: ?Goal: Risk for activity intolerance will decrease ?Outcome: Progressing ?  ?Problem: Nutrition: ?Goal: Adequate nutrition will be maintained ?Outcome: Progressing ?  ?Problem: Pain Managment: ?Goal: General experience of comfort will improve ?Outcome: Not Progressing ?  ?

## 2021-08-22 ENCOUNTER — Encounter (HOSPITAL_COMMUNITY): Payer: Self-pay | Admitting: General Surgery

## 2021-08-22 LAB — BASIC METABOLIC PANEL
Anion gap: 5 (ref 5–15)
BUN: 8 mg/dL (ref 8–23)
CO2: 24 mmol/L (ref 22–32)
Calcium: 7.8 mg/dL — ABNORMAL LOW (ref 8.9–10.3)
Chloride: 103 mmol/L (ref 98–111)
Creatinine, Ser: 0.51 mg/dL (ref 0.44–1.00)
GFR, Estimated: >60 mL/min (ref >60)
Glucose, Bld: 129 mg/dL — ABNORMAL HIGH (ref 70–99)
Potassium: 3.3 mmol/L — ABNORMAL LOW (ref 3.5–5.1)
Sodium: 132 mmol/L — ABNORMAL LOW (ref 135–145)

## 2021-08-22 LAB — CBC
HCT: 32.1 % — ABNORMAL LOW (ref 36.0–46.0)
Hemoglobin: 10.5 g/dL — ABNORMAL LOW (ref 12.0–15.0)
MCH: 30.3 pg (ref 26.0–34.0)
MCHC: 32.7 g/dL (ref 30.0–36.0)
MCV: 92.8 fL (ref 80.0–100.0)
Platelets: 173 10*3/uL (ref 150–400)
RBC: 3.46 MIL/uL — ABNORMAL LOW (ref 3.87–5.11)
RDW: 14.1 % (ref 11.5–15.5)
WBC: 13.5 10*3/uL — ABNORMAL HIGH (ref 4.0–10.5)
nRBC: 0 % (ref 0.0–0.2)

## 2021-08-22 LAB — GLUCOSE, CAPILLARY
Glucose-Capillary: 117 mg/dL — ABNORMAL HIGH (ref 70–99)
Glucose-Capillary: 134 mg/dL — ABNORMAL HIGH (ref 70–99)
Glucose-Capillary: 134 mg/dL — ABNORMAL HIGH (ref 70–99)
Glucose-Capillary: 135 mg/dL — ABNORMAL HIGH (ref 70–99)
Glucose-Capillary: 138 mg/dL — ABNORMAL HIGH (ref 70–99)
Glucose-Capillary: 148 mg/dL — ABNORMAL HIGH (ref 70–99)
Glucose-Capillary: 158 mg/dL — ABNORMAL HIGH (ref 70–99)

## 2021-08-22 MED ORDER — LOSARTAN POTASSIUM 50 MG PO TABS
100.0000 mg | ORAL_TABLET | Freq: Every day | ORAL | Status: DC
  Administered 2021-08-22: 100 mg via ORAL
  Filled 2021-08-22: qty 2

## 2021-08-22 NOTE — Plan of Care (Signed)
?  Problem: Education: ?Goal: Knowledge of General Education information will improve ?Description: Including pain rating scale, medication(s)/side effects and non-pharmacologic comfort measures ?Outcome: Progressing ?  ?Problem: Clinical Measurements: ?Goal: Ability to maintain clinical measurements within normal limits will improve ?Outcome: Progressing ?  ?Problem: Activity: ?Goal: Risk for activity intolerance will decrease ?Outcome: Progressing ?  ?Problem: Nutrition: ?Goal: Adequate nutrition will be maintained ?Outcome: Progressing ?  ?Problem: Elimination: ?Goal: Will not experience complications related to bowel motility ?Outcome: Progressing ?  ?Problem: Elimination: ?Goal: Will not experience complications related to urinary retention ?Outcome: Progressing ?  ?

## 2021-08-22 NOTE — Consult Note (Signed)
? ?  [facility] CM Inpatient Consult ? ? ?08/22/2021 ? ?Delena Serve ?[birth year] ?[id number] ? ? [employer] Customer service manager [[employer]]  Occupational hygienist [ACO] Patient: [employer] plan ? ?Primary Care Provider:  Daisy Floro, MD, [facility] this provider is listed for the Transition of Care call and follow up ? ?Plan: Patient will be followed by [facility] RN Care Coordinator as an outreach and for community follow up support in the [facility] plan. ?  ?For additional questions or referrals please contact: ?  ?Charlesetta Shanks, RN BSN CCM ?[employer] Liaison ? [phone] business mobile phone ?Toll free office [phone]  ?Fax number: [phone] ?[email or web address] ?[email or web address] ? ? ?

## 2021-08-22 NOTE — Anesthesia Postprocedure Evaluation (Signed)
Anesthesia Post Note ? ?Patient: Karina Oneal ? ?Procedure(s) Performed: LAPAROSCOPIC SIGMOID COLECTOMY WITH ANASTOMOSIS, BILATERAL TAP BLOCK ? ?  ? ?Patient location during evaluation: PACU ?Anesthesia Type: General ?Level of consciousness: awake ?Pain management: pain level controlled ?Vital Signs Assessment: post-procedure vital signs reviewed and stable ?Respiratory status: spontaneous breathing, nonlabored ventilation, respiratory function stable and patient connected to nasal cannula oxygen ?Cardiovascular status: blood pressure returned to baseline and stable ?Postop Assessment: no apparent nausea or vomiting ?Anesthetic complications: no ? ? ?No notable events documented. ? ?Last Vitals:  ?Vitals:  ? 08/22/21 0111 08/22/21 0508  ?BP: (!) 127/55 (!) 132/50  ?Pulse: 65 64  ?Resp: 17 17  ?Temp: 36.9 ?C 36.7 ?C  ?SpO2: 94% 94%  ?  ?Last Pain:  ?Vitals:  ? 08/22/21 0508  ?TempSrc: Oral  ?PainSc:   ? ? ?  ?  ?  ?  ?  ?  ? ?Frimy Uffelman P Olive Motyka ? ? ? ? ?

## 2021-08-22 NOTE — Progress Notes (Signed)
?1 Day Post-Op  ? ?Chief Complaint/Subjective: ?Minimal pain, tolerating liquids, +flatus ? ?Objective: ?Vital signs in last 24 hours: ?Temp:  [97.3 ?F (36.3 ?C)-98.4 ?F (36.9 ?C)] 98.1 ?F (36.7 ?C) (04/28 9417) ?Pulse Rate:  [59-70] 69 (04/28 0923) ?Resp:  [13-19] 19 (04/28 4081) ?BP: (117-160)/(50-84) 151/61 (04/28 4481) ?SpO2:  [93 %-98 %] 96 % (04/28 0923) ?Last BM Date : 08/20/21 ?Intake/Output from previous day: ?04/27 0701 - 04/28 0700 ?In: 3888 [P.O.:1340; I.V.:2448; IV Piggyback:100] ?Out: 1450 [Urine:1425; Blood:25] ?Intake/Output this shift: ?Total I/O ?In: 360 [P.O.:360] ?Out: 800 [Urine:800] ? ?PE: ?Gen: NAd ?Resp: nonlabored ?Card: RRR ?Abd: soft, incisions well healed ? ?Lab Results:  ?Recent Labs  ?  08/21/21 ?1310 08/22/21 ?0410  ?WBC 16.0* 13.5*  ?HGB 13.3 10.5*  ?HCT 40.9 32.1*  ?PLT 210 173  ? ?BMET ?Recent Labs  ?  08/21/21 ?1310 08/22/21 ?0410  ?NA  --  132*  ?K  --  3.3*  ?CL  --  103  ?CO2  --  24  ?GLUCOSE  --  129*  ?BUN  --  8  ?CREATININE 0.71 0.51  ?CALCIUM  --  7.8*  ? ?PT/INR ?No results for input(s): LABPROT, INR in the last 72 hours. ?CMP  ?   ?Component Value Date/Time  ? NA 132 (L) 08/22/2021 0410  ? NA 141 06/13/2020 1600  ? K 3.3 (L) 08/22/2021 0410  ? CL 103 08/22/2021 0410  ? CO2 24 08/22/2021 0410  ? GLUCOSE 129 (H) 08/22/2021 0410  ? BUN 8 08/22/2021 0410  ? BUN 9 06/13/2020 1600  ? CREATININE 0.51 08/22/2021 0410  ? CALCIUM 7.8 (L) 08/22/2021 0410  ? PROT 6.9 06/13/2020 1600  ? ALBUMIN 4.2 06/13/2020 1600  ? AST 22 06/13/2020 1600  ? ALT 21 06/06/2014 0300  ? ALKPHOS 92 06/13/2020 1600  ? BILITOT 0.3 06/13/2020 1600  ? GFRNONAA >60 08/22/2021 0410  ? GFRAA 110 06/13/2020 1600  ? ?Lipase  ?No results found for: LIPASE ? ?Studies/Results: ?No results found. ? ?Anti-infectives: ?Anti-infectives (From admission, onward)  ? ? Start     Dose/Rate Route Frequency Ordered Stop  ? 08/21/21 1400  neomycin (MYCIFRADIN) tablet 1,000 mg  Status:  Discontinued       ?See Hyperspace for  full Linked Orders Report.  ? 1,000 mg Oral 3 times per day 08/21/21 0624 08/21/21 0634  ? 08/21/21 1400  metroNIDAZOLE (FLAGYL) tablet 1,000 mg  Status:  Discontinued       ?See Hyperspace for full Linked Orders Report.  ? 1,000 mg Oral 3 times per day 08/21/21 0624 08/21/21 0634  ? 08/21/21 0630  cefoTEtan (CEFOTAN) 2 g in sodium chloride 0.9 % 100 mL IVPB       ? 2 g ?200 mL/hr over 30 Minutes Intravenous On call to O.R. 08/21/21 8563 08/21/21 0905  ? ?  ? ? ?Assessment/Plan ? s/p Procedure(s): ?LAPAROSCOPIC SIGMOID COLECTOMY WITH ANASTOMOSIS, BILATERAL TAP BLOCK 08/21/2021  ? ?FEN - carb mod diet ?VTE - lovenox ?ID - periop abx complete ?Disposition - ambulate, dc foley, await return of bowel function ? ? LOS: 1 day  ? ?I reviewed last 24 h vitals and pain scores, last 48 h intake and output, last 24 h labs and trends, and last 24 h imaging results. ? ?This care required moderate level of medical decision making.  ? ?De Blanch Shirla Hodgkiss ?[facility] ?08/22/2021, 9:51 AM ?Please see Amion for pager number during day hours 7:00am-4:30pm or 7:00am -11:30am on weekends ? ? ?

## 2021-08-22 NOTE — Progress Notes (Signed)
?  Transition of Care (TOC) Screening Note ? ? ?Patient Details  ?Name: Karina Oneal ?Date of Birth: 1956-03-14 ? ? ?Transition of Care (TOC) CM/SW Contact:    ?Omarie Parcell, LCSW ?Phone Number: ?08/22/2021, 12:41 PM ? ? ? ?Transition of Care Department [facility]) has reviewed patient and no TOC needs have been identified at this time. We will continue to monitor patient advancement through interdisciplinary progression rounds. If new patient transition needs arise, please place a TOC consult. ? ? ?

## 2021-08-23 LAB — CBC
HCT: 32.9 % — ABNORMAL LOW (ref 36.0–46.0)
Hemoglobin: 10.8 g/dL — ABNORMAL LOW (ref 12.0–15.0)
MCH: 30.4 pg (ref 26.0–34.0)
MCHC: 32.8 g/dL (ref 30.0–36.0)
MCV: 92.7 fL (ref 80.0–100.0)
Platelets: 164 10*3/uL (ref 150–400)
RBC: 3.55 MIL/uL — ABNORMAL LOW (ref 3.87–5.11)
RDW: 14.3 % (ref 11.5–15.5)
WBC: 8.5 10*3/uL (ref 4.0–10.5)
nRBC: 0 % (ref 0.0–0.2)

## 2021-08-23 LAB — GLUCOSE, CAPILLARY
Glucose-Capillary: 113 mg/dL — ABNORMAL HIGH (ref 70–99)
Glucose-Capillary: 107 mg/dL — ABNORMAL HIGH (ref 70–99)

## 2021-08-23 MED ORDER — OXYCODONE HCL 5 MG PO TABS
5.0000 mg | ORAL_TABLET | Freq: Four times a day (QID) | ORAL | 0 refills | Status: DC | PRN
  Filled 2021-08-23: qty 15, 4d supply, fill #0

## 2021-08-23 MED ORDER — OXYCODONE HCL 5 MG PO TABS: 5.0000 mg | ORAL_TABLET | Freq: Four times a day (QID) | ORAL | 0 refills | Status: DC | PRN

## 2021-08-23 NOTE — Discharge Instructions (Signed)
[facility]      [facility] [phone]  OPEN ABDOMINAL SURGERY: POST OP INSTRUCTIONS  Always review your discharge instruction sheet given to you by the facility where your surgery was performed.  IF YOU HAVE DISABILITY OR FAMILY LEAVE FORMS, YOU MUST BRING THEM TO THE OFFICE FOR PROCESSING.  PLEASE DO NOT GIVE THEM TO YOUR DOCTOR.  A prescription for pain medication may be given to you upon discharge.  Take your pain medication as prescribed, if needed.  If narcotic pain medicine is not needed, then you may take acetaminophen (Tylenol) or ibuprofen (Advil) as needed. Take your usually prescribed medications unless otherwise directed. If you need a refill on your pain medication, please contact your pharmacy. They will contact our office to request authorization.  Prescriptions will not be filled after 5pm or on week-ends. You should follow a light diet the first few days after arrival home, such as soup and crackers, pudding, etc.unless your doctor has advised otherwise. A high-fiber, low fat diet can be resumed as tolerated.   Be sure to include lots of fluids daily. Most patients will experience some swelling and bruising on the chest and neck area.  Ice packs will help.  Swelling and bruising can take several days to resolve Most patients will experience some swelling and bruising in the area of the incision. Ice pack will help. Swelling and bruising can take several days to resolve..  It is common to experience some constipation if taking pain medication after surgery.  Increasing fluid intake and taking a stool softener will usually help or prevent this problem from occurring.  A mild laxative (Milk of Magnesia or Miralax) should be taken according to package directions if there are no bowel movements after 48 hours.  You may have steri-strips (small skin tapes) in place directly over the incision.  These strips should be left on the skin for 7-10 days.  If your surgeon used skin  glue on the incision, you may shower in 24 hours.  The glue will flake off over the next 2-3 weeks.  Any sutures or staples will be removed at the office during your follow-up visit. You may find that a light gauze bandage over your incision may keep your staples from being rubbed or pulled. You may shower and replace the bandage daily. ACTIVITIES:  You may resume regular (light) daily activities beginning the next day--such as daily self-care, walking, climbing stairs--gradually increasing activities as tolerated.  You may have sexual intercourse when it is comfortable.  Refrain from any heavy lifting or straining until approved by your doctor. You may drive when you no longer are taking prescription pain medication, you can comfortably wear a seatbelt, and you can safely maneuver your car and apply brakes Return to Work: ___________________________________ You should see your doctor in the office for a follow-up appointment approximately two weeks after your surgery.  Make sure that you call for this appointment within a day or two after you arrive home to insure a convenient appointment time. OTHER INSTRUCTIONS:  _____________________________________________________________ _____________________________________________________________  WHEN TO CALL YOUR DOCTOR: Fever over 101.0 Inability to urinate Nausea and/or vomiting Extreme swelling or bruising Continued bleeding from incision. Increased pain, redness, or drainage from the incision. Difficulty swallowing or breathing Muscle cramping or spasms. Numbness or tingling in hands or feet or around lips.  The clinic staff is available to answer your questions during regular business hours.  Please don't hesitate to call and ask to speak to one of   the nurses if you have concerns.  For further questions, please visit [email or web address]  

## 2021-08-23 NOTE — Discharge Summary (Signed)
Physician Discharge Summary  ?Patient ID: ?Karina Oneal ?MRN: [id number] ?DOB/AGE: August 04, 1955 66 y.o. ? ?Admit date: 08/21/2021 ?Discharge date: 08/23/2021 ? ?Admission Diagnoses:Principal Problem: ?  Diverticulitis ? ?Discharge Diagnoses:  ?Principal Problem: ?  Diverticulitis ? ? ?Discharged Condition: good ? ?Hospital Course: Pt did dwell post op. Her bowel function returned, she tolerated her diet and had good pain control.  ? ? ? ? ? ?Treatments: surgery: sigmoid colectomy  ? ?Discharge Exam: ?Blood pressure (!) 144/71, pulse 63, temperature 98.7 ?F (37.1 ?C), temperature source Oral, resp. rate 18, height 5\' 5"  (1.651 m), weight 111.5 kg, SpO2 92 %. ?General appearance: alert ?Resp: clear to auscultation bilaterally ?Cardio: NSR ?Incision/Wound:incisions CDI  soft ND sore around the incisions  ? ?Disposition: Discharge disposition: 01-Home or Self Care ? ? ? ? ? ? ?Discharge Instructions   ? ? Call MD for:  difficulty breathing, headache or visual disturbances   Complete by: As directed ?  ? Call MD for:  hives   Complete by: As directed ?  ? Call MD for:  persistant nausea and vomiting   Complete by: As directed ?  ? Call MD for:  redness, tenderness, or signs of infection (pain, swelling, redness, odor or green/yellow discharge around incision site)   Complete by: As directed ?  ? Call MD for:  severe uncontrolled pain   Complete by: As directed ?  ? Call MD for:  temperature >100.4   Complete by: As directed ?  ? Diet - low sodium heart healthy   Complete by: As directed ?  ? Diet - low sodium heart healthy   Complete by: As directed ?  ? Discharge wound care:   Complete by: As directed ?  ? Ok to . Glue will likely peel off in 1-3 weeks. No bandage required  ? Driving Restrictions   Complete by: As directed ?  ? No driving while on narcotics  ? Increase activity slowly   Complete by: As directed ?  ? Increase activity slowly   Complete by: As directed ?  ? Lifting restrictions   Complete by: As  directed ?  ? No lifting greater than 20 pounds for 3 weeks  ? ?  ? ?Allergies as of 08/23/2021   ? ?   Reactions  ? Ciprofloxacin Nausea And Vomiting  ? Lisinopril Cough  ? Sulfa Antibiotics Itching  ? ?  ? ?  ?Medication List  ?  ? ?TAKE these medications   ? ?albuterol 108 (90 Base) MCG/ACT inhaler ?Commonly known as: VENTOLIN HFA ?Inhale 2 puffs into the lungs every 6 (six) hours as needed for wheezing. ?  ?amLODipine 10 MG tablet ?Commonly known as: NORVASC ?Take 1 tablet (10 mg total) by mouth daily. ?  ?aspirin 81 MG EC tablet ?Take 81 mg by mouth daily. ?  ?bisacodyl 5 MG EC tablet ?Generic drug: bisacodyl ?Take 4 tablets (20 mg total) by mouth daily as needed for constipation for up to 1 dose ?  ?buPROPion 150 MG 24 hr tablet ?Commonly known as: WELLBUTRIN XL ?Take 1 tablet (150 mg total) by mouth daily. ?  ?carvedilol 12.5 MG tablet ?Commonly known as: COREG ?TAKE 1 TABLET BY MOUTH 2 TIMES DAILY. HOLD IF SYSTOLIC BLOOD PRESSURE (TOP BP NUMBER) LESS THAN 100 MMHG OR HEART RATE LESS THAN 60 BPM (PULSE). ?  ?CO Q 10 PO ?Take 1 tablet by mouth daily. ?  ?ezetimibe 10 MG tablet ?Commonly known as: ZETIA ?Take 1 tablet (10 mg  total) by mouth daily. ?  ?hydrALAZINE 25 MG tablet ?Commonly known as: APRESOLINE ?Take 1 tablet (25 mg total) by mouth 3 (three) times daily. ?  ?hydrochlorothiazide 25 MG tablet ?Commonly known as: HYDRODIURIL ?TAKE 1 TABLET BY MOUTH ONCE DAILY. ?  ?hydrochlorothiazide 25 MG tablet ?Commonly known as: HYDRODIURIL ?Take 1 tablet (25 mg total) by mouth daily. ?  ?ibuprofen 200 MG tablet ?Commonly known as: ADVIL ?Take 200-400 mg by mouth every 6 (six) hours as needed for moderate pain. ?  ?isosorbide dinitrate 20 MG tablet ?Commonly known as: ISORDIL ?Take 1 tablet (20 mg total) by mouth 3 (three) times daily. ?  ?losartan 100 MG tablet ?Commonly known as: COZAAR ?TAKE 1 TABLET BY MOUTH ONCE A DAY ?  ?losartan 100 MG tablet ?Commonly known as: COZAAR ?Take 1 tablet (100 mg total) by mouth  daily. ?  ?metFORMIN 500 MG 24 hr tablet ?Commonly known as: GLUCOPHAGE-XR ?TAKE 2 TABLETS BY MOUTH WITH EVENING MEAL DAILY ?  ?metFORMIN 500 MG 24 hr tablet ?Commonly known as: GLUCOPHAGE-XR ?Take 1 tablet (500 mg total) by mouth daily with a meal ?  ?metroNIDAZOLE 500 MG tablet ?Commonly known as: FLAGYL ?Take 2 tablets (1,000 mg total) by mouth 3 (three) times daily for 3 doses. Take according to your procedure colon prep instructions ?  ?multivitamin tablet ?Take 1 tablet by mouth daily. ?  ?neomycin 500 MG tablet ?Commonly known as: MYCIFRADIN ?Take 2 tablets (1,000 mg total) by mouth 3 (three) times daily for 3 doses. Take according to your procedure colon prep instructions ?  ?nitroGLYCERIN 0.4 MG SL tablet ?Commonly known as: NITROSTAT ?Place 0.4 mg under the tongue every 5 (five) minutes as needed. For chest pain ?  ?oxyCODONE 5 MG immediate release tablet ?Commonly known as: Oxy IR/ROXICODONE ?Take 1 tablet (5 mg total) by mouth every 6 (six) hours as needed for severe pain. ?  ?pioglitazone 15 MG tablet ?Commonly known as: ACTOS ?Take 1 tablet (15 mg total) by mouth daily. ?  ?pioglitazone 15 MG tablet ?Commonly known as: ACTOS ?Take 1 tablet (15 mg total) by mouth daily. ?  ?polyethylene glycol powder 17 GM/SCOOP powder ?Commonly known as: GLYCOLAX/MIRALAX ?Take 238 g by mouth once for 1 dose. Take according to your procedure prep instructions ?  ?PROBIOTIC GUMMIES PO ?Take 2 tablets by mouth daily. ?  ?Repatha SureClick 140 MG/ML Soaj ?Generic drug: Evolocumab ?Inject 140 mg into the skin every 14 (fourteen) days. ?  ?sertraline 100 MG tablet ?Commonly known as: ZOLOFT ?Take 1 tablet (100 mg total) by mouth daily. ?  ?traZODone 100 MG tablet ?Commonly known as: DESYREL ?Take 2 tablets (200 mg total) by mouth at bedtime. ?  ?vitamin C 100 MG tablet ?Take 100 mg by mouth daily. ?  ?Vitamin D 50 MCG (2000 UT) Caps ?Take 2,000 Units by mouth daily. ?  ? ?  ? ?  ?  ? ? ?  ?Discharge Care Instructions  ?(From  admission, onward)  ?  ? ? ?  ? ?  Start     Ordered  ? 08/22/21 0000  Discharge wound care:       ?Comments: Ok to English as a second language teachershower tomorrow. Glue will likely peel off in 1-3 weeks. No bandage required  ? 08/22/21 0959  ? ?  ?  ? ?  ? ? Follow-up Information   ? ? Kinsinger, De BlanchLuke Aaron, MD. Schedule an appointment as soon as possible for a visit in 2 week(s).   ?Specialty: General Surgery ?Contact  information: ?[address] ?[address] ?[place] [address] ?[phone] ? ? ?  ?  ? ?  ?  ? ?  ? ? ?Signed: ?Dortha Schwalbe MD  ?08/23/2021, 11:48 AM ? ? ?

## 2021-08-23 NOTE — Progress Notes (Signed)
Reviewed written d/c instructions w pt and all questions answered. She verbalized understanding. D/C via w/c w all belongings in stable condition. 

## 2021-08-25 ENCOUNTER — Other Ambulatory Visit: Payer: Self-pay | Admitting: *Deleted

## 2021-08-25 ENCOUNTER — Other Ambulatory Visit (HOSPITAL_COMMUNITY): Payer: Self-pay

## 2021-08-25 LAB — SURGICAL PATHOLOGY

## 2021-08-25 NOTE — Patient Outreach (Signed)
[facility]) Care Management ? ?08/25/2021 ? ?Karina Oneal ?05-23-1955 ?[id number] ? ?Telephone outreach for [facility] pt who is post op partial colectomy for diverticulitis. ? ?Completed transition of care call. Pt home with support, will not have home health, has meds, able to eat, has food, has f/u appt scheduled and has transportation. Pt does not feel she needs ongoing care management. Advised will send a successful letter for future reference. ? ?Patient Active Problem List  ? Diagnosis Date Noted  ? Diverticulitis 08/21/2021  ? Intermediate coronary syndrome (HCC) 09/19/2013  ? GERD (gastroesophageal reflux disease)   ? COPD (chronic obstructive pulmonary disease) (HCC)   ? Cough 11/11/2012  ? HCAP (healthcare-associated pneumonia) 11/03/2012  ? Leukocytosis, unspecified 11/03/2012  ? Hypokalemia 12/16/2011  ? CAD (coronary artery disease) 06/05/2011  ? Chest pain 05/08/2011  ? Tobacco abuse 05/08/2011  ? Obesity 05/08/2011  ? Hypertension 05/08/2011  ? Dyslipidemia 05/08/2011  ? ?Patient was recently discharged from hospital and all medications have been reviewed. ?Outpatient Encounter Medications as of 08/25/2021  ?Medication Sig Note  ? albuterol (PROVENTIL HFA;VENTOLIN HFA) 108 (90 BASE) MCG/ACT inhaler Inhale 2 puffs into the lungs every 6 (six) hours as needed for wheezing.    ? amLODipine (NORVASC) 10 MG tablet Take 1 tablet (10 mg total) by mouth daily.   ? Ascorbic Acid (VITAMIN C) 100 MG tablet Take 100 mg by mouth daily.   ? aspirin 81 MG EC tablet Take 81 mg by mouth daily.   ? bisacodyl 5 MG EC tablet Take 4 tablets (20 mg total) by mouth daily as needed for constipation for up to 1 dose 08/07/2021: For use prior to procedure  ? buPROPion (WELLBUTRIN XL) 150 MG 24 hr tablet Take 1 tablet (150 mg total) by mouth daily.   ? carvedilol (COREG) 12.5 MG tablet TAKE 1 TABLET BY MOUTH 2 TIMES DAILY. HOLD IF SYSTOLIC BLOOD PRESSURE (TOP BP NUMBER) LESS THAN 100 MMHG OR HEART RATE LESS THAN 60 BPM  (PULSE).   ? Cholecalciferol (VITAMIN D) 50 MCG (2000 UT) CAPS Take 2,000 Units by mouth daily.    ? Coenzyme Q10 (CO Q 10 PO) Take 1 tablet by mouth daily.   ? Evolocumab (REPATHA SURECLICK) 140 MG/ML SOAJ Inject 140 mg into the skin every 14 (fourteen) days.   ? ezetimibe (ZETIA) 10 MG tablet Take 1 tablet (10 mg total) by mouth daily.   ? hydrALAZINE (APRESOLINE) 25 MG tablet Take 1 tablet (25 mg total) by mouth 3 (three) times daily.   ? hydrochlorothiazide (HYDRODIURIL) 25 MG tablet Take 1 tablet (25 mg total) by mouth daily.   ? ibuprofen (ADVIL) 200 MG tablet Take 200-400 mg by mouth every 6 (six) hours as needed for moderate pain.   ? isosorbide dinitrate (ISORDIL) 20 MG tablet Take 1 tablet (20 mg total) by mouth 3 (three) times daily.   ? losartan (COZAAR) 100 MG tablet TAKE 1 TABLET BY MOUTH ONCE A DAY (Patient not taking: Reported on 08/07/2021)   ? losartan (COZAAR) 100 MG tablet Take 1 tablet (100 mg total) by mouth daily.   ? metFORMIN (GLUCOPHAGE-XR) 500 MG 24 hr tablet TAKE 2 TABLETS BY MOUTH WITH EVENING MEAL DAILY (Patient not taking: Reported on 08/07/2021)   ? metFORMIN (GLUCOPHAGE-XR) 500 MG 24 hr tablet Take 1 tablet (500 mg total) by mouth daily with a meal   ? metroNIDAZOLE (FLAGYL) 500 MG tablet Take 2 tablets (1,000 mg total) by mouth 3 (three) times daily  for 3 doses. Take according to your procedure colon prep instructions 08/07/2021: For use prior to procedure  ? Multiple Vitamin (MULTIVITAMIN) tablet Take 1 tablet by mouth daily.   ? neomycin (MYCIFRADIN) 500 MG tablet Take 2 tablets (1,000 mg total) by mouth 3 (three) times daily for 3 doses. Take according to your procedure colon prep instructions 08/07/2021: For use prior to procedure  ? nitroGLYCERIN (NITROSTAT) 0.4 MG SL tablet Place 0.4 mg under the tongue every 5 (five) minutes as needed. For chest pain 09/12/2019: Never needed  ? oxyCODONE (OXY IR/ROXICODONE) 5 MG immediate release tablet Take 1 tablet (5 mg total) by mouth every 6  (six) hours as needed for severe pain.   ? pioglitazone (ACTOS) 15 MG tablet Take 1 tablet (15 mg total) by mouth daily. (Patient not taking: Reported on 08/07/2021)   ? pioglitazone (ACTOS) 15 MG tablet Take 1 tablet (15 mg total) by mouth daily.   ? polyethylene glycol powder (GLYCOLAX/MIRALAX) 17 GM/SCOOP powder Take 238 g by mouth once for 1 dose. Take according to your procedure prep instructions 08/07/2021: For use prior to procedure  ? Probiotic Product (PROBIOTIC GUMMIES PO) Take 2 tablets by mouth daily.   ? sertraline (ZOLOFT) 100 MG tablet Take 1 tablet (100 mg total) by mouth daily.   ? traZODone (DESYREL) 100 MG tablet Take 2 tablets (200 mg total) by mouth at bedtime.   ? [DISCONTINUED] hydrochlorothiazide (HYDRODIURIL) 25 MG tablet TAKE 1 TABLET BY MOUTH ONCE DAILY. (Patient not taking: Reported on 08/07/2021)   ? ?No facility-administered encounter medications on file as of 08/25/2021.  ? ?No further contact needed. ? ?Zara Council. Anitha Kreiser, MSN, GNP-BC ?Gerontological Nurse Practitioner ?[facility] Care Management ?[phone] ? ? ? ? ?

## 2021-08-25 NOTE — Patient Outreach (Signed)
Received a hospital discharge notification for Ms. See . ?I have assigned Almetta Lovely, NP to call for follow up and determine if there are any Case Management needs.  ?  ?Iverson Alamin, CBCS, CMAA ?[facility] Care Management Assistant ?[facility] Care Management ?[phone]   ?

## 2021-08-27 ENCOUNTER — Other Ambulatory Visit (HOSPITAL_COMMUNITY): Payer: Self-pay

## 2021-08-27 MED ORDER — BUPROPION HCL ER (XL) 150 MG PO TB24
150.0000 mg | ORAL_TABLET | Freq: Every day | ORAL | 4 refills | Status: DC
  Filled 2021-08-27 – 2022-06-11 (×2): qty 90, 90d supply, fill #0

## 2021-08-27 MED ORDER — METFORMIN HCL ER 500 MG PO TB24
500.0000 mg | ORAL_TABLET | Freq: Every day | ORAL | 4 refills | Status: DC
  Filled 2021-08-27: qty 90, 90d supply, fill #0

## 2021-08-27 MED ORDER — SERTRALINE HCL 100 MG PO TABS
100.0000 mg | ORAL_TABLET | Freq: Every day | ORAL | 4 refills | Status: DC
  Filled 2021-08-27 – 2022-06-11 (×2): qty 90, 90d supply, fill #0

## 2021-08-27 MED ORDER — TRAZODONE HCL 100 MG PO TABS
200.0000 mg | ORAL_TABLET | Freq: Every day | ORAL | 4 refills | Status: DC
  Filled 2021-08-27: qty 180, 90d supply, fill #0

## 2021-08-27 MED ORDER — HYDROCHLOROTHIAZIDE 25 MG PO TABS
25.0000 mg | ORAL_TABLET | Freq: Every day | ORAL | 4 refills | Status: DC
  Filled 2021-08-27 – 2022-04-30 (×2): qty 90, 90d supply, fill #0

## 2021-08-27 MED ORDER — LOSARTAN POTASSIUM 100 MG PO TABS
100.0000 mg | ORAL_TABLET | Freq: Every day | ORAL | 4 refills | Status: DC
  Filled 2021-08-27: qty 90, 90d supply, fill #0

## 2021-09-08 ENCOUNTER — Other Ambulatory Visit (HOSPITAL_COMMUNITY): Payer: Self-pay

## 2021-09-08 ENCOUNTER — Other Ambulatory Visit: Payer: Self-pay | Admitting: Cardiology

## 2021-09-08 DIAGNOSIS — E782 Mixed hyperlipidemia: Secondary | ICD-10-CM

## 2021-09-08 MED ORDER — REPATHA SURECLICK 140 MG/ML ~~LOC~~ SOAJ
140.0000 mg | SUBCUTANEOUS | 3 refills | Status: DC
  Filled 2021-09-08: qty 2, 28d supply, fill #0
  Filled 2021-10-07: qty 2, 28d supply, fill #1
  Filled 2021-11-04: qty 2, 28d supply, fill #2
  Filled 2021-12-30: qty 2, 28d supply, fill #3

## 2021-09-24 ENCOUNTER — Other Ambulatory Visit (HOSPITAL_COMMUNITY): Payer: Self-pay

## 2021-09-25 ENCOUNTER — Other Ambulatory Visit (HOSPITAL_COMMUNITY): Payer: Self-pay

## 2021-10-07 ENCOUNTER — Other Ambulatory Visit: Payer: Self-pay | Admitting: Cardiology

## 2021-10-07 ENCOUNTER — Other Ambulatory Visit (HOSPITAL_COMMUNITY): Payer: Self-pay

## 2021-10-07 DIAGNOSIS — I10 Essential (primary) hypertension: Secondary | ICD-10-CM

## 2021-10-07 MED ORDER — ISOSORBIDE DINITRATE 20 MG PO TABS
20.0000 mg | ORAL_TABLET | Freq: Three times a day (TID) | ORAL | 0 refills | Status: DC
  Filled 2021-10-07: qty 270, 90d supply, fill #0

## 2021-11-04 ENCOUNTER — Other Ambulatory Visit (HOSPITAL_COMMUNITY): Payer: Self-pay

## 2021-11-04 ENCOUNTER — Other Ambulatory Visit: Payer: Self-pay | Admitting: Cardiology

## 2021-11-04 DIAGNOSIS — I10 Essential (primary) hypertension: Secondary | ICD-10-CM

## 2021-11-04 MED ORDER — HYDRALAZINE HCL 25 MG PO TABS
25.0000 mg | ORAL_TABLET | Freq: Three times a day (TID) | ORAL | 0 refills | Status: DC
  Filled 2021-11-04: qty 270, 90d supply, fill #0

## 2021-12-30 ENCOUNTER — Other Ambulatory Visit (HOSPITAL_COMMUNITY): Payer: Self-pay

## 2021-12-30 ENCOUNTER — Other Ambulatory Visit: Payer: Self-pay | Admitting: Cardiology

## 2021-12-30 DIAGNOSIS — I10 Essential (primary) hypertension: Secondary | ICD-10-CM

## 2021-12-30 MED ORDER — ISOSORBIDE DINITRATE 20 MG PO TABS
20.0000 mg | ORAL_TABLET | Freq: Three times a day (TID) | ORAL | 0 refills | Status: DC
  Filled 2021-12-30: qty 270, 90d supply, fill #0

## 2021-12-30 MED ORDER — SERTRALINE HCL 100 MG PO TABS
100.0000 mg | ORAL_TABLET | Freq: Every day | ORAL | 0 refills | Status: DC
  Filled 2021-12-30: qty 90, 90d supply, fill #0

## 2021-12-30 MED ORDER — LOSARTAN POTASSIUM 100 MG PO TABS
100.0000 mg | ORAL_TABLET | Freq: Every day | ORAL | 1 refills | Status: DC
  Filled 2021-12-30: qty 90, 90d supply, fill #0
  Filled 2022-04-30: qty 90, 90d supply, fill #1

## 2021-12-30 MED ORDER — BUPROPION HCL ER (XL) 150 MG PO TB24
150.0000 mg | ORAL_TABLET | Freq: Every day | ORAL | 1 refills | Status: DC
  Filled 2021-12-30: qty 90, 90d supply, fill #0
  Filled 2022-03-12: qty 90, 90d supply, fill #1

## 2021-12-31 ENCOUNTER — Other Ambulatory Visit (HOSPITAL_COMMUNITY): Payer: Self-pay

## 2021-12-31 MED ORDER — AMLODIPINE BESYLATE 10 MG PO TABS
10.0000 mg | ORAL_TABLET | Freq: Every day | ORAL | 4 refills | Status: DC
  Filled 2021-12-31 – 2022-06-11 (×2): qty 90, 90d supply, fill #0
  Filled 2022-09-08: qty 90, 90d supply, fill #1
  Filled 2022-12-07: qty 90, 90d supply, fill #2

## 2021-12-31 MED ORDER — TRAZODONE HCL 100 MG PO TABS
100.0000 mg | ORAL_TABLET | Freq: Every day | ORAL | 4 refills | Status: DC
  Filled 2021-12-31 – 2022-06-11 (×2): qty 90, 90d supply, fill #0
  Filled 2022-09-08: qty 90, 90d supply, fill #1

## 2022-01-27 ENCOUNTER — Other Ambulatory Visit (HOSPITAL_COMMUNITY): Payer: Self-pay

## 2022-01-27 ENCOUNTER — Other Ambulatory Visit: Payer: Self-pay | Admitting: Cardiology

## 2022-01-27 DIAGNOSIS — E782 Mixed hyperlipidemia: Secondary | ICD-10-CM

## 2022-01-27 DIAGNOSIS — I10 Essential (primary) hypertension: Secondary | ICD-10-CM

## 2022-01-27 MED ORDER — REPATHA SURECLICK 140 MG/ML ~~LOC~~ SOAJ
140.0000 mg | SUBCUTANEOUS | 3 refills | Status: DC
  Filled 2022-01-27: qty 2, 28d supply, fill #0
  Filled 2022-03-12: qty 2, 28d supply, fill #1
  Filled 2022-04-30: qty 2, 28d supply, fill #2
  Filled 2022-06-09: qty 2, 28d supply, fill #3

## 2022-01-27 MED ORDER — CARVEDILOL 12.5 MG PO TABS
12.5000 mg | ORAL_TABLET | Freq: Two times a day (BID) | ORAL | 1 refills | Status: DC
  Filled 2022-01-27: qty 180, 90d supply, fill #0
  Filled 2022-04-30: qty 180, 90d supply, fill #1

## 2022-01-27 MED ORDER — EZETIMIBE 10 MG PO TABS
10.0000 mg | ORAL_TABLET | Freq: Every day | ORAL | 1 refills | Status: DC
  Filled 2022-01-27: qty 90, 90d supply, fill #0
  Filled 2022-04-30: qty 90, 90d supply, fill #1

## 2022-03-11 ENCOUNTER — Other Ambulatory Visit (HOSPITAL_COMMUNITY): Payer: Self-pay

## 2022-03-11 MED ORDER — MOUNJARO 2.5 MG/0.5ML ~~LOC~~ SOAJ
2.5000 mg | SUBCUTANEOUS | 6 refills | Status: DC
  Filled 2022-03-11 – 2022-04-30 (×7): qty 2, 28d supply, fill #0
  Filled 2022-05-27: qty 2, 28d supply, fill #1

## 2022-03-12 ENCOUNTER — Other Ambulatory Visit (HOSPITAL_COMMUNITY): Payer: Self-pay

## 2022-03-12 MED ORDER — SERTRALINE HCL 100 MG PO TABS
100.0000 mg | ORAL_TABLET | Freq: Every day | ORAL | 0 refills | Status: DC
  Filled 2022-03-12: qty 90, 90d supply, fill #0

## 2022-03-12 MED ORDER — AMLODIPINE BESYLATE 10 MG PO TABS
10.0000 mg | ORAL_TABLET | Freq: Every day | ORAL | 1 refills | Status: DC
  Filled 2022-03-12: qty 90, 90d supply, fill #0
  Filled 2022-06-09: qty 90, 90d supply, fill #1

## 2022-03-13 ENCOUNTER — Other Ambulatory Visit (HOSPITAL_COMMUNITY): Payer: Self-pay

## 2022-03-16 ENCOUNTER — Other Ambulatory Visit (HOSPITAL_COMMUNITY): Payer: Self-pay

## 2022-03-23 ENCOUNTER — Other Ambulatory Visit (HOSPITAL_COMMUNITY): Payer: Self-pay

## 2022-04-11 ENCOUNTER — Emergency Department (HOSPITAL_COMMUNITY): Payer: No Typology Code available for payment source

## 2022-04-11 ENCOUNTER — Other Ambulatory Visit: Payer: Self-pay

## 2022-04-11 ENCOUNTER — Encounter (HOSPITAL_COMMUNITY): Payer: Self-pay

## 2022-04-11 ENCOUNTER — Emergency Department (HOSPITAL_COMMUNITY)
Admission: EM | Admit: 2022-04-11 | Discharge: 2022-04-11 | Disposition: A | Discharge status: Home / Self Care | Payer: No Typology Code available for payment source | Attending: Emergency Medicine | Admitting: Emergency Medicine

## 2022-04-11 DIAGNOSIS — S3992XA Unspecified injury of lower back, initial encounter: Secondary | ICD-10-CM | POA: Diagnosis not present

## 2022-04-11 DIAGNOSIS — Y92410 Unspecified street and highway as the place of occurrence of the external cause: Secondary | ICD-10-CM | POA: Insufficient documentation

## 2022-04-11 DIAGNOSIS — Z79899 Other long term (current) drug therapy: Secondary | ICD-10-CM | POA: Insufficient documentation

## 2022-04-11 DIAGNOSIS — Y998 Other external cause status: Secondary | ICD-10-CM | POA: Diagnosis not present

## 2022-04-11 DIAGNOSIS — Z7982 Long term (current) use of aspirin: Secondary | ICD-10-CM | POA: Insufficient documentation

## 2022-04-11 DIAGNOSIS — S335XXA Sprain of ligaments of lumbar spine, initial encounter: Secondary | ICD-10-CM | POA: Diagnosis not present

## 2022-04-11 DIAGNOSIS — S139XXA Sprain of joints and ligaments of unspecified parts of neck, initial encounter: Secondary | ICD-10-CM

## 2022-04-11 DIAGNOSIS — S134XXA Sprain of ligaments of cervical spine, initial encounter: Secondary | ICD-10-CM | POA: Diagnosis present

## 2022-04-11 DIAGNOSIS — Y9389 Activity, other specified: Secondary | ICD-10-CM | POA: Insufficient documentation
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
## 2022-04-30 ENCOUNTER — Other Ambulatory Visit: Payer: Self-pay | Admitting: Cardiology

## 2022-04-30 ENCOUNTER — Other Ambulatory Visit: Payer: Self-pay

## 2022-04-30 ENCOUNTER — Other Ambulatory Visit (HOSPITAL_COMMUNITY): Payer: Self-pay

## 2022-04-30 DIAGNOSIS — I10 Essential (primary) hypertension: Secondary | ICD-10-CM

## 2022-04-30 MED ORDER — HYDRALAZINE HCL 25 MG PO TABS
25.0000 mg | ORAL_TABLET | Freq: Three times a day (TID) | ORAL | 0 refills | Status: DC
  Filled 2022-04-30: qty 270, 90d supply, fill #0

## 2022-04-30 MED ORDER — ISOSORBIDE DINITRATE 20 MG PO TABS
20.0000 mg | ORAL_TABLET | Freq: Three times a day (TID) | ORAL | 0 refills | Status: DC
  Filled 2022-04-30: qty 270, 90d supply, fill #0

## 2022-05-01 ENCOUNTER — Other Ambulatory Visit (HOSPITAL_COMMUNITY): Payer: Self-pay

## 2022-05-27 ENCOUNTER — Other Ambulatory Visit: Payer: Self-pay

## 2022-05-28 ENCOUNTER — Other Ambulatory Visit (HOSPITAL_COMMUNITY): Payer: Self-pay

## 2022-05-28 DIAGNOSIS — Z955 Presence of coronary angioplasty implant and graft: Secondary | ICD-10-CM | POA: Diagnosis not present

## 2022-05-28 DIAGNOSIS — E782 Mixed hyperlipidemia: Secondary | ICD-10-CM | POA: Diagnosis not present

## 2022-05-28 DIAGNOSIS — I2510 Atherosclerotic heart disease of native coronary artery without angina pectoris: Secondary | ICD-10-CM | POA: Diagnosis not present

## 2022-05-29 LAB — CMP14+EGFR
ALT: 18 IU/L (ref 0–32)
AST: 16 IU/L (ref 0–40)
Albumin/Globulin Ratio: 1.6 (ref 1.2–2.2)
Albumin: 4 g/dL (ref 3.9–4.9)
Alkaline Phosphatase: 99 IU/L (ref 44–121)
BUN/Creatinine Ratio: 12 (ref 12–28)
BUN: 9 mg/dL (ref 8–27)
Bilirubin Total: 0.3 mg/dL (ref 0.0–1.2)
CO2: 25 mmol/L (ref 20–29)
Calcium: 9.3 mg/dL (ref 8.7–10.3)
Chloride: 100 mmol/L (ref 96–106)
Creatinine, Ser: 0.78 mg/dL (ref 0.57–1.00)
Globulin, Total: 2.5 g/dL (ref 1.5–4.5)
Glucose: 148 mg/dL — ABNORMAL HIGH (ref 70–99)
Potassium: 4.1 mmol/L (ref 3.5–5.2)
Sodium: 140 mmol/L (ref 134–144)
Total Protein: 6.5 g/dL (ref 6.0–8.5)
eGFR: 84 mL/min/{1.73_m2} (ref >59)

## 2022-05-29 LAB — LIPID PANEL WITH LDL/HDL RATIO
Cholesterol, Total: 150 mg/dL (ref 100–199)
HDL: 55 mg/dL (ref >39)
LDL Chol Calc (NIH): 72 mg/dL (ref 0–99)
LDL/HDL Ratio: 1.3 ratio (ref 0.0–3.2)
Triglycerides: 133 mg/dL (ref 0–149)
VLDL Cholesterol Cal: 23 mg/dL (ref 5–40)

## 2022-05-29 LAB — LDL CHOLESTEROL, DIRECT: LDL Direct: 74 mg/dL (ref 0–99)

## 2022-06-09 ENCOUNTER — Other Ambulatory Visit (HOSPITAL_COMMUNITY): Payer: Self-pay

## 2022-06-09 ENCOUNTER — Other Ambulatory Visit: Payer: Self-pay

## 2022-06-09 MED ORDER — SERTRALINE HCL 100 MG PO TABS
100.0000 mg | ORAL_TABLET | Freq: Every day | ORAL | 0 refills | Status: DC
  Filled 2022-06-09: qty 90, 90d supply, fill #0

## 2022-06-09 MED ORDER — BUPROPION HCL ER (XL) 150 MG PO TB24
150.0000 mg | ORAL_TABLET | Freq: Every day | ORAL | 2 refills | Status: DC
  Filled 2022-06-09: qty 90, 90d supply, fill #0

## 2022-06-10 ENCOUNTER — Other Ambulatory Visit (HOSPITAL_COMMUNITY): Payer: Self-pay

## 2022-06-10 MED ORDER — TRAZODONE HCL 100 MG PO TABS
200.0000 mg | ORAL_TABLET | Freq: Every day | ORAL | 1 refills | Status: DC
  Filled 2022-06-10: qty 180, 90d supply, fill #0

## 2022-06-11 ENCOUNTER — Other Ambulatory Visit (HOSPITAL_COMMUNITY): Payer: Self-pay

## 2022-06-11 ENCOUNTER — Ambulatory Visit: Payer: 59 | Admitting: Cardiology

## 2022-06-11 ENCOUNTER — Encounter: Payer: Self-pay | Admitting: Cardiology

## 2022-06-11 VITALS — BP 151/74 | HR 72 | Resp 16 | Ht 65.0 in | Wt 223.0 lb

## 2022-06-11 DIAGNOSIS — E785 Hyperlipidemia, unspecified: Secondary | ICD-10-CM | POA: Diagnosis not present

## 2022-06-11 DIAGNOSIS — G4730 Sleep apnea, unspecified: Secondary | ICD-10-CM

## 2022-06-11 DIAGNOSIS — I2510 Atherosclerotic heart disease of native coronary artery without angina pectoris: Secondary | ICD-10-CM | POA: Diagnosis not present

## 2022-06-11 DIAGNOSIS — E782 Mixed hyperlipidemia: Secondary | ICD-10-CM

## 2022-06-11 DIAGNOSIS — I10 Essential (primary) hypertension: Secondary | ICD-10-CM

## 2022-06-11 DIAGNOSIS — E1169 Type 2 diabetes mellitus with other specified complication: Secondary | ICD-10-CM

## 2022-06-11 DIAGNOSIS — E119 Type 2 diabetes mellitus without complications: Secondary | ICD-10-CM

## 2022-06-11 DIAGNOSIS — Z87891 Personal history of nicotine dependence: Secondary | ICD-10-CM

## 2022-06-11 DIAGNOSIS — Z955 Presence of coronary angioplasty implant and graft: Secondary | ICD-10-CM | POA: Diagnosis not present

## 2022-06-11 MED ORDER — CARVEDILOL 25 MG PO TABS
25.0000 mg | ORAL_TABLET | Freq: Two times a day (BID) | ORAL | 0 refills | Status: DC
  Filled 2022-06-11: qty 180, 90d supply, fill #0
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
## 2022-06-18 ENCOUNTER — Other Ambulatory Visit (HOSPITAL_COMMUNITY): Payer: Self-pay

## 2022-06-18 MED ORDER — ACCU-CHEK SOFTCLIX LANCETS MISC
5 refills | Status: AC
  Filled 2022-06-18: qty 100, 90d supply, fill #0

## 2022-06-18 MED ORDER — ACCU-CHEK GUIDE VI STRP
ORAL_STRIP | 5 refills | Status: AC
  Filled 2022-06-18: qty 100, 90d supply, fill #0

## 2022-06-18 MED ORDER — ACCU-CHEK GUIDE W/DEVICE KIT
PACK | 0 refills | Status: AC
  Filled 2022-06-18: qty 1, 1d supply, fill #0

## 2022-06-18 MED ORDER — MOUNJARO 5 MG/0.5ML ~~LOC~~ SOAJ
5.0000 mg | SUBCUTANEOUS | 3 refills | Status: DC
  Filled 2022-06-18 – 2022-06-25 (×2): qty 2, 28d supply, fill #0
  Filled 2022-07-23 – 2022-07-28 (×2): qty 2, 28d supply, fill #1
  Filled 2022-08-21: qty 2, 28d supply, fill #2
  Filled 2022-09-17: qty 2, 28d supply, fill #3

## 2022-06-25 ENCOUNTER — Other Ambulatory Visit (HOSPITAL_COMMUNITY): Payer: Self-pay

## 2022-06-29 ENCOUNTER — Other Ambulatory Visit (HOSPITAL_COMMUNITY): Payer: Self-pay

## 2022-07-10 ENCOUNTER — Other Ambulatory Visit (HOSPITAL_COMMUNITY): Payer: Self-pay

## 2022-07-10 ENCOUNTER — Other Ambulatory Visit: Payer: Self-pay | Admitting: Cardiology

## 2022-07-10 DIAGNOSIS — E782 Mixed hyperlipidemia: Secondary | ICD-10-CM

## 2022-07-10 MED ORDER — REPATHA SURECLICK 140 MG/ML ~~LOC~~ SOAJ
140.0000 mg | SUBCUTANEOUS | 3 refills | Status: DC
  Filled 2022-07-10: qty 2, 28d supply, fill #0
  Filled 2022-07-30 – 2022-08-03 (×3): qty 2, 28d supply, fill #1
  Filled 2022-09-08: qty 2, 28d supply, fill #2
  Filled 2022-10-07: qty 2, 28d supply, fill #3

## 2022-07-14 ENCOUNTER — Other Ambulatory Visit (HOSPITAL_COMMUNITY): Payer: Self-pay

## 2022-07-23 ENCOUNTER — Other Ambulatory Visit: Payer: Self-pay | Admitting: Cardiology

## 2022-07-23 DIAGNOSIS — E782 Mixed hyperlipidemia: Secondary | ICD-10-CM

## 2022-07-24 ENCOUNTER — Other Ambulatory Visit: Payer: Self-pay

## 2022-07-27 ENCOUNTER — Other Ambulatory Visit (HOSPITAL_COMMUNITY): Payer: Self-pay

## 2022-07-27 MED ORDER — EZETIMIBE 10 MG PO TABS
10.0000 mg | ORAL_TABLET | Freq: Every day | ORAL | 1 refills | Status: DC
  Filled 2022-07-27: qty 90, 90d supply, fill #0
  Filled 2022-10-21: qty 90, 90d supply, fill #1

## 2022-07-28 ENCOUNTER — Other Ambulatory Visit (HOSPITAL_COMMUNITY): Payer: Self-pay

## 2022-07-30 ENCOUNTER — Other Ambulatory Visit (HOSPITAL_COMMUNITY): Payer: Self-pay

## 2022-07-30 MED ORDER — LOSARTAN POTASSIUM 100 MG PO TABS
100.0000 mg | ORAL_TABLET | Freq: Every day | ORAL | 0 refills | Status: DC
  Filled 2022-07-30: qty 90, 90d supply, fill #0

## 2022-08-03 ENCOUNTER — Other Ambulatory Visit (HOSPITAL_COMMUNITY): Payer: Self-pay

## 2022-08-22 ENCOUNTER — Other Ambulatory Visit (HOSPITAL_COMMUNITY): Payer: Self-pay

## 2022-08-26 ENCOUNTER — Other Ambulatory Visit: Payer: Self-pay

## 2022-08-26 ENCOUNTER — Other Ambulatory Visit (HOSPITAL_COMMUNITY): Payer: Self-pay

## 2022-09-02 DIAGNOSIS — Z13220 Encounter for screening for lipoid disorders: Secondary | ICD-10-CM | POA: Diagnosis not present

## 2022-09-02 DIAGNOSIS — E1169 Type 2 diabetes mellitus with other specified complication: Secondary | ICD-10-CM | POA: Diagnosis not present

## 2022-09-02 DIAGNOSIS — Z0000 Encounter for general adult medical examination without abnormal findings: Secondary | ICD-10-CM | POA: Diagnosis not present

## 2022-09-08 ENCOUNTER — Other Ambulatory Visit: Payer: Self-pay

## 2022-09-08 ENCOUNTER — Other Ambulatory Visit: Payer: Self-pay | Admitting: Cardiology

## 2022-09-08 ENCOUNTER — Other Ambulatory Visit (HOSPITAL_COMMUNITY): Payer: Self-pay

## 2022-09-08 DIAGNOSIS — J439 Emphysema, unspecified: Secondary | ICD-10-CM | POA: Diagnosis not present

## 2022-09-08 DIAGNOSIS — Z0000 Encounter for general adult medical examination without abnormal findings: Secondary | ICD-10-CM | POA: Diagnosis not present

## 2022-09-08 DIAGNOSIS — I10 Essential (primary) hypertension: Secondary | ICD-10-CM | POA: Diagnosis not present

## 2022-09-08 DIAGNOSIS — Z6835 Body mass index (BMI) 35.0-35.9, adult: Secondary | ICD-10-CM | POA: Diagnosis not present

## 2022-09-08 DIAGNOSIS — E7800 Pure hypercholesterolemia, unspecified: Secondary | ICD-10-CM | POA: Diagnosis not present

## 2022-09-08 DIAGNOSIS — E1169 Type 2 diabetes mellitus with other specified complication: Secondary | ICD-10-CM | POA: Diagnosis not present

## 2022-09-08 DIAGNOSIS — G4726 Circadian rhythm sleep disorder, shift work type: Secondary | ICD-10-CM | POA: Diagnosis not present

## 2022-09-08 DIAGNOSIS — F324 Major depressive disorder, single episode, in partial remission: Secondary | ICD-10-CM | POA: Diagnosis not present

## 2022-09-08 DIAGNOSIS — Z23 Encounter for immunization: Secondary | ICD-10-CM | POA: Diagnosis not present

## 2022-09-08 DIAGNOSIS — I2510 Atherosclerotic heart disease of native coronary artery without angina pectoris: Secondary | ICD-10-CM

## 2022-09-08 MED ORDER — AMLODIPINE BESYLATE 10 MG PO TABS
10.0000 mg | ORAL_TABLET | Freq: Every day | ORAL | 4 refills | Status: AC | PRN
  Filled 2022-09-08: qty 90, 90d supply, fill #0

## 2022-09-08 MED ORDER — MOUNJARO 7.5 MG/0.5ML ~~LOC~~ SOAJ
7.5000 mg | SUBCUTANEOUS | 12 refills | Status: DC
  Filled 2022-09-08 – 2022-09-23 (×2): qty 2, 28d supply, fill #0
  Filled 2022-10-21: qty 2, 28d supply, fill #1
  Filled 2022-11-18: qty 2, 28d supply, fill #2

## 2022-09-08 MED ORDER — CARVEDILOL 25 MG PO TABS
25.0000 mg | ORAL_TABLET | Freq: Two times a day (BID) | ORAL | 0 refills | Status: DC
  Filled 2022-09-08: qty 180, 90d supply, fill #0

## 2022-09-08 MED ORDER — TRAZODONE HCL 100 MG PO TABS
200.0000 mg | ORAL_TABLET | Freq: Every day | ORAL | 4 refills | Status: DC
  Filled 2022-09-08 (×2): qty 180, 90d supply, fill #0
  Filled 2022-12-07: qty 180, 90d supply, fill #1
  Filled 2023-03-23: qty 180, 90d supply, fill #2
  Filled 2023-07-07: qty 180, 90d supply, fill #3

## 2022-09-08 MED ORDER — HYDROCHLOROTHIAZIDE 25 MG PO TABS
25.0000 mg | ORAL_TABLET | Freq: Every morning | ORAL | 4 refills | Status: DC
  Filled 2022-09-08 – 2022-10-22 (×2): qty 90, 90d supply, fill #0

## 2022-09-08 MED ORDER — LOSARTAN POTASSIUM 100 MG PO TABS
100.0000 mg | ORAL_TABLET | Freq: Every day | ORAL | 4 refills | Status: DC
  Filled 2022-09-08 – 2022-10-21 (×2): qty 90, 90d supply, fill #0
  Filled 2023-07-30: qty 90, 90d supply, fill #1

## 2022-09-08 MED ORDER — BUPROPION HCL ER (XL) 150 MG PO TB24
150.0000 mg | ORAL_TABLET | Freq: Every morning | ORAL | 4 refills | Status: AC
  Filled 2022-09-08: qty 90, 90d supply, fill #0
  Filled 2022-12-07: qty 90, 90d supply, fill #1

## 2022-09-08 MED ORDER — SERTRALINE HCL 100 MG PO TABS
100.0000 mg | ORAL_TABLET | Freq: Every day | ORAL | 4 refills | Status: AC
  Filled 2022-09-08: qty 90, 90d supply, fill #0
  Filled 2022-12-07: qty 90, 90d supply, fill #1
  Filled 2023-03-11 – 2023-03-12 (×3): qty 90, 90d supply, fill #2
  Filled 2023-06-08: qty 90, 90d supply, fill #3

## 2022-09-09 ENCOUNTER — Other Ambulatory Visit (HOSPITAL_COMMUNITY): Payer: Self-pay

## 2022-09-17 ENCOUNTER — Other Ambulatory Visit (HOSPITAL_COMMUNITY): Payer: Self-pay

## 2022-09-19 ENCOUNTER — Other Ambulatory Visit (HOSPITAL_COMMUNITY): Payer: Self-pay

## 2022-09-22 ENCOUNTER — Other Ambulatory Visit (HOSPITAL_COMMUNITY): Payer: Self-pay

## 2022-09-23 ENCOUNTER — Other Ambulatory Visit (HOSPITAL_COMMUNITY): Payer: Self-pay

## 2022-10-08 ENCOUNTER — Other Ambulatory Visit (HOSPITAL_COMMUNITY): Payer: Self-pay

## 2022-10-21 ENCOUNTER — Other Ambulatory Visit: Payer: Self-pay

## 2022-10-21 ENCOUNTER — Other Ambulatory Visit (HOSPITAL_COMMUNITY): Payer: Self-pay

## 2022-10-21 ENCOUNTER — Other Ambulatory Visit: Payer: Self-pay | Admitting: Cardiology

## 2022-10-21 DIAGNOSIS — I10 Essential (primary) hypertension: Secondary | ICD-10-CM

## 2022-10-21 MED ORDER — HYDRALAZINE HCL 25 MG PO TABS
25.00 mg | ORAL_TABLET | Freq: Three times a day (TID) | ORAL | 0 refills | Status: AC
Start: 2022-10-21 — End: ?
  Filled 2022-10-21 – 2022-11-03 (×2): qty 270, 90d supply, fill #0

## 2022-10-21 MED ORDER — LOSARTAN POTASSIUM 100 MG PO TABS
100.0000 mg | ORAL_TABLET | Freq: Every day | ORAL | 1 refills | Status: DC
  Filled 2022-10-21 – 2023-01-26 (×2): qty 90, 90d supply, fill #0
  Filled 2023-04-13 – 2023-04-14 (×2): qty 90, 90d supply, fill #1

## 2022-10-22 ENCOUNTER — Other Ambulatory Visit (HOSPITAL_COMMUNITY): Payer: Self-pay

## 2022-11-02 ENCOUNTER — Other Ambulatory Visit: Payer: Self-pay | Admitting: Cardiology

## 2022-11-02 ENCOUNTER — Other Ambulatory Visit (HOSPITAL_COMMUNITY): Payer: Self-pay

## 2022-11-02 DIAGNOSIS — E782 Mixed hyperlipidemia: Secondary | ICD-10-CM

## 2022-11-02 MED ORDER — REPATHA SURECLICK 140 MG/ML ~~LOC~~ SOAJ
140.0000 mg | SUBCUTANEOUS | 3 refills | Status: DC
Start: 2022-11-02 — End: 2023-02-23
  Filled 2022-11-02: qty 2, 28d supply, fill #0
  Filled 2022-11-27: qty 2, 28d supply, fill #1
  Filled 2022-12-29: qty 2, 28d supply, fill #0
  Filled 2023-01-26: qty 2, 28d supply, fill #1

## 2022-11-03 ENCOUNTER — Other Ambulatory Visit (HOSPITAL_COMMUNITY): Payer: Self-pay

## 2022-11-18 ENCOUNTER — Other Ambulatory Visit (HOSPITAL_COMMUNITY): Payer: Self-pay

## 2022-11-18 ENCOUNTER — Other Ambulatory Visit: Payer: Self-pay

## 2022-11-19 ENCOUNTER — Other Ambulatory Visit (HOSPITAL_COMMUNITY): Payer: Self-pay

## 2022-11-27 ENCOUNTER — Other Ambulatory Visit (HOSPITAL_COMMUNITY): Payer: Self-pay

## 2022-12-01 ENCOUNTER — Other Ambulatory Visit (HOSPITAL_COMMUNITY): Payer: Self-pay

## 2022-12-01 MED ORDER — MOUNJARO 10 MG/0.5ML ~~LOC~~ SOAJ
10.0000 mg | SUBCUTANEOUS | 3 refills | Status: AC
  Filled 2022-12-01 – 2023-01-11 (×4): qty 2, 28d supply, fill #0
  Filled 2023-02-10: qty 2, 28d supply, fill #1
  Filled 2023-03-11: qty 2, 28d supply, fill #2

## 2022-12-07 ENCOUNTER — Other Ambulatory Visit (HOSPITAL_COMMUNITY): Payer: Self-pay

## 2022-12-07 ENCOUNTER — Other Ambulatory Visit: Payer: Self-pay | Admitting: Cardiology

## 2022-12-07 DIAGNOSIS — I2510 Atherosclerotic heart disease of native coronary artery without angina pectoris: Secondary | ICD-10-CM

## 2022-12-07 DIAGNOSIS — I10 Essential (primary) hypertension: Secondary | ICD-10-CM

## 2022-12-07 MED ORDER — CARVEDILOL 25 MG PO TABS
25.0000 mg | ORAL_TABLET | Freq: Two times a day (BID) | ORAL | 0 refills | Status: DC
Start: 2022-12-07 — End: 2023-03-17
  Filled 2022-12-07: qty 180, 90d supply, fill #0

## 2022-12-11 ENCOUNTER — Other Ambulatory Visit (HOSPITAL_COMMUNITY): Payer: Self-pay

## 2022-12-29 ENCOUNTER — Other Ambulatory Visit (HOSPITAL_COMMUNITY): Payer: Self-pay

## 2022-12-29 ENCOUNTER — Other Ambulatory Visit: Payer: Self-pay

## 2022-12-30 ENCOUNTER — Other Ambulatory Visit: Payer: Self-pay

## 2023-01-11 ENCOUNTER — Other Ambulatory Visit (HOSPITAL_COMMUNITY): Payer: Self-pay

## 2023-01-11 ENCOUNTER — Other Ambulatory Visit: Payer: Self-pay

## 2023-01-26 ENCOUNTER — Other Ambulatory Visit: Payer: Self-pay

## 2023-01-26 ENCOUNTER — Other Ambulatory Visit (HOSPITAL_COMMUNITY): Payer: Self-pay

## 2023-01-26 ENCOUNTER — Other Ambulatory Visit: Payer: Self-pay | Admitting: Cardiology

## 2023-01-26 DIAGNOSIS — E782 Mixed hyperlipidemia: Secondary | ICD-10-CM

## 2023-01-26 DIAGNOSIS — I10 Essential (primary) hypertension: Secondary | ICD-10-CM

## 2023-01-27 ENCOUNTER — Other Ambulatory Visit (HOSPITAL_COMMUNITY): Payer: Self-pay

## 2023-01-27 ENCOUNTER — Other Ambulatory Visit: Payer: Self-pay

## 2023-01-27 MED ORDER — EZETIMIBE 10 MG PO TABS
10.0000 mg | ORAL_TABLET | Freq: Every day | ORAL | 1 refills | Status: DC
Start: 2023-01-27 — End: 2023-06-08
  Filled 2023-01-27: qty 90, 90d supply, fill #0
  Filled 2023-04-26: qty 90, 90d supply, fill #1

## 2023-01-27 MED ORDER — HYDRALAZINE HCL 25 MG PO TABS
25.0000 mg | ORAL_TABLET | Freq: Three times a day (TID) | ORAL | 1 refills | Status: DC
  Filled 2023-01-27: qty 270, 90d supply, fill #0
  Filled 2023-06-08: qty 270, 90d supply, fill #1

## 2023-02-10 ENCOUNTER — Other Ambulatory Visit (HOSPITAL_COMMUNITY): Payer: Self-pay

## 2023-02-23 ENCOUNTER — Other Ambulatory Visit: Payer: Self-pay | Admitting: Cardiology

## 2023-02-23 ENCOUNTER — Other Ambulatory Visit: Payer: Self-pay

## 2023-02-23 DIAGNOSIS — E782 Mixed hyperlipidemia: Secondary | ICD-10-CM

## 2023-02-23 MED ORDER — REPATHA SURECLICK 140 MG/ML ~~LOC~~ SOAJ
140.0000 mg | SUBCUTANEOUS | 1 refills | Status: DC
  Filled 2023-02-23: qty 2, 28d supply, fill #0
  Filled 2023-03-23: qty 2, 28d supply, fill #1
  Filled 2023-04-13 – 2023-04-14 (×2): qty 2, 28d supply, fill #2
  Filled 2023-05-24: qty 2, 28d supply, fill #3
  Filled 2023-07-07: qty 2, 28d supply, fill #4
  Filled 2023-07-30: qty 2, 28d supply, fill #5

## 2023-03-10 ENCOUNTER — Other Ambulatory Visit: Payer: Self-pay

## 2023-03-10 ENCOUNTER — Other Ambulatory Visit (HOSPITAL_COMMUNITY): Payer: Self-pay

## 2023-03-10 DIAGNOSIS — Z6834 Body mass index (BMI) 34.0-34.9, adult: Secondary | ICD-10-CM | POA: Diagnosis not present

## 2023-03-10 DIAGNOSIS — J439 Emphysema, unspecified: Secondary | ICD-10-CM | POA: Diagnosis not present

## 2023-03-10 DIAGNOSIS — F324 Major depressive disorder, single episode, in partial remission: Secondary | ICD-10-CM | POA: Diagnosis not present

## 2023-03-10 DIAGNOSIS — I10 Essential (primary) hypertension: Secondary | ICD-10-CM | POA: Diagnosis not present

## 2023-03-10 DIAGNOSIS — R8290 Unspecified abnormal findings in urine: Secondary | ICD-10-CM | POA: Diagnosis not present

## 2023-03-10 DIAGNOSIS — E1169 Type 2 diabetes mellitus with other specified complication: Secondary | ICD-10-CM | POA: Diagnosis not present

## 2023-03-10 MED ORDER — AMLODIPINE BESYLATE 10 MG PO TABS
10.0000 mg | ORAL_TABLET | Freq: Every day | ORAL | 4 refills | Status: AC
  Filled 2023-03-10: qty 90, 90d supply, fill #0
  Filled 2023-06-08: qty 90, 90d supply, fill #1

## 2023-03-10 MED ORDER — BUPROPION HCL ER (XL) 150 MG PO TB24
150.0000 mg | ORAL_TABLET | Freq: Every morning | ORAL | 4 refills | Status: AC
  Filled 2023-03-10: qty 90, 90d supply, fill #0
  Filled 2023-06-08: qty 90, 90d supply, fill #1
  Filled 2023-12-10 – 2023-12-13 (×2): qty 90, 90d supply, fill #2

## 2023-03-10 MED ORDER — MOUNJARO 12.5 MG/0.5ML ~~LOC~~ SOAJ
12.5000 mg | SUBCUTANEOUS | 6 refills | Status: AC
Start: 2023-03-10 — End: ?
  Filled 2023-03-10 – 2023-03-17 (×2): qty 2, 28d supply, fill #0
  Filled 2023-04-13 – 2023-04-14 (×2): qty 2, 28d supply, fill #1
  Filled 2023-05-24 – 2023-06-14 (×3): qty 2, 28d supply, fill #2
  Filled 2023-07-07: qty 2, 28d supply, fill #3
  Filled 2023-07-30: qty 2, 28d supply, fill #4
  Filled 2023-08-26: qty 2, 28d supply, fill #5

## 2023-03-11 ENCOUNTER — Other Ambulatory Visit: Payer: Self-pay

## 2023-03-11 ENCOUNTER — Other Ambulatory Visit (HOSPITAL_COMMUNITY): Payer: Self-pay

## 2023-03-11 ENCOUNTER — Encounter: Payer: Self-pay | Admitting: Pharmacist

## 2023-03-12 ENCOUNTER — Other Ambulatory Visit: Payer: Self-pay

## 2023-03-16 ENCOUNTER — Other Ambulatory Visit: Payer: Self-pay

## 2023-03-17 ENCOUNTER — Other Ambulatory Visit: Payer: Self-pay | Admitting: Cardiology

## 2023-03-17 ENCOUNTER — Other Ambulatory Visit (HOSPITAL_COMMUNITY): Payer: Self-pay

## 2023-03-17 ENCOUNTER — Other Ambulatory Visit: Payer: Self-pay

## 2023-03-17 DIAGNOSIS — I10 Essential (primary) hypertension: Secondary | ICD-10-CM

## 2023-03-17 DIAGNOSIS — I2510 Atherosclerotic heart disease of native coronary artery without angina pectoris: Secondary | ICD-10-CM

## 2023-03-17 MED ORDER — CARVEDILOL 25 MG PO TABS
25.0000 mg | ORAL_TABLET | Freq: Two times a day (BID) | ORAL | 0 refills | Status: DC
  Filled 2023-03-17: qty 180, 90d supply, fill #0

## 2023-03-23 ENCOUNTER — Other Ambulatory Visit (HOSPITAL_COMMUNITY): Payer: Self-pay

## 2023-04-13 ENCOUNTER — Other Ambulatory Visit (HOSPITAL_COMMUNITY): Payer: Self-pay

## 2023-04-22 ENCOUNTER — Other Ambulatory Visit: Payer: Self-pay

## 2023-04-26 ENCOUNTER — Other Ambulatory Visit: Payer: Self-pay

## 2023-04-26 ENCOUNTER — Other Ambulatory Visit (HOSPITAL_COMMUNITY): Payer: Self-pay

## 2023-05-24 ENCOUNTER — Other Ambulatory Visit (HOSPITAL_COMMUNITY): Payer: Self-pay

## 2023-05-25 ENCOUNTER — Other Ambulatory Visit (HOSPITAL_COMMUNITY): Payer: Self-pay

## 2023-05-31 ENCOUNTER — Other Ambulatory Visit: Payer: 59

## 2023-05-31 ENCOUNTER — Ambulatory Visit (HOSPITAL_COMMUNITY): Payer: Medicare Other | Attending: Cardiology

## 2023-05-31 ENCOUNTER — Other Ambulatory Visit: Payer: Self-pay | Admitting: Cardiology

## 2023-05-31 DIAGNOSIS — I2510 Atherosclerotic heart disease of native coronary artery without angina pectoris: Secondary | ICD-10-CM | POA: Insufficient documentation

## 2023-05-31 LAB — CMP14+EGFR
ALT: 12 [IU]/L (ref 0–32)
AST: 15 [IU]/L (ref 0–40)
Albumin: 4.2 g/dL (ref 3.9–4.9)
Alkaline Phosphatase: 93 [IU]/L (ref 44–121)
BUN/Creatinine Ratio: 18 (ref 12–28)
BUN: 14 mg/dL (ref 8–27)
Bilirubin Total: 0.3 mg/dL (ref 0.0–1.2)
CO2: 24 mmol/L (ref 20–29)
Calcium: 9.3 mg/dL (ref 8.7–10.3)
Chloride: 101 mmol/L (ref 96–106)
Creatinine, Ser: 0.79 mg/dL (ref 0.57–1.00)
Globulin, Total: 2.3 g/dL (ref 1.5–4.5)
Glucose: 119 mg/dL — ABNORMAL HIGH (ref 70–99)
Potassium: 4.2 mmol/L (ref 3.5–5.2)
Sodium: 138 mmol/L (ref 134–144)
Total Protein: 6.5 g/dL (ref 6.0–8.5)
eGFR: 82 mL/min/{1.73_m2} (ref >59)

## 2023-05-31 LAB — LDL CHOLESTEROL, DIRECT: LDL Direct: 84 mg/dL (ref 0–99)

## 2023-05-31 LAB — LIPID PANEL WITH LDL/HDL RATIO
Cholesterol, Total: 173 mg/dL (ref 100–199)
HDL: 63 mg/dL (ref >39)
LDL Chol Calc (NIH): 85 mg/dL (ref 0–99)
LDL/HDL Ratio: 1.3 {ratio} (ref 0.0–3.2)
Triglycerides: 148 mg/dL (ref 0–149)
VLDL Cholesterol Cal: 25 mg/dL (ref 5–40)

## 2023-05-31 LAB — ECHOCARDIOGRAM COMPLETE
Area-P 1/2: 3.48 cm2
S' Lateral: 2.4 cm

## 2023-06-08 ENCOUNTER — Other Ambulatory Visit: Payer: Self-pay | Admitting: Cardiology

## 2023-06-08 ENCOUNTER — Telehealth: Payer: Self-pay | Admitting: Pharmacy Technician

## 2023-06-08 ENCOUNTER — Other Ambulatory Visit (HOSPITAL_COMMUNITY): Payer: Self-pay

## 2023-06-08 ENCOUNTER — Telehealth: Payer: Self-pay | Admitting: Cardiology

## 2023-06-08 ENCOUNTER — Other Ambulatory Visit: Payer: Self-pay

## 2023-06-08 DIAGNOSIS — I2510 Atherosclerotic heart disease of native coronary artery without angina pectoris: Secondary | ICD-10-CM

## 2023-06-08 DIAGNOSIS — I10 Essential (primary) hypertension: Secondary | ICD-10-CM

## 2023-06-08 DIAGNOSIS — E782 Mixed hyperlipidemia: Secondary | ICD-10-CM

## 2023-06-08 MED ORDER — EZETIMIBE 10 MG PO TABS
10.0000 mg | ORAL_TABLET | Freq: Every day | ORAL | 0 refills | Status: DC
  Filled 2023-06-08: qty 90, 90d supply, fill #0

## 2023-06-08 MED ORDER — CARVEDILOL 25 MG PO TABS
25.0000 mg | ORAL_TABLET | Freq: Two times a day (BID) | ORAL | 0 refills | Status: DC
  Filled 2023-06-08: qty 180, 90d supply, fill #0

## 2023-06-08 NOTE — Telephone Encounter (Signed)
Pt called in stating she got a new insurance plan Medicare part D and she needs PA for her Repatha

## 2023-06-08 NOTE — Telephone Encounter (Signed)
Pharmacy Patient Advocate Encounter   Received notification from Pt Calls Messages that prior authorization for repatha is required/requested.   Insurance verification completed.   The patient is insured through [employer] .   Per test claim: PA required; PA submitted to above mentioned insurance via CoverMyMeds Key/confirmation #/EOC [id number] Status is pending

## 2023-06-08 NOTE — Telephone Encounter (Signed)
Pharmacy Patient Advocate Encounter  Received notification from [facility] that Prior Authorization for Repatha has been APPROVED from 06/08/23 to 06/07/24   PA #/Case ID/Reference #: [id number]

## 2023-06-09 ENCOUNTER — Other Ambulatory Visit (HOSPITAL_COMMUNITY): Payer: Self-pay

## 2023-06-09 ENCOUNTER — Other Ambulatory Visit: Payer: Self-pay

## 2023-06-10 ENCOUNTER — Ambulatory Visit: Payer: Self-pay | Admitting: Cardiology

## 2023-06-10 ENCOUNTER — Other Ambulatory Visit (HOSPITAL_COMMUNITY): Payer: Self-pay

## 2023-06-11 ENCOUNTER — Other Ambulatory Visit: Payer: Self-pay

## 2023-06-14 ENCOUNTER — Other Ambulatory Visit (HOSPITAL_COMMUNITY): Payer: Self-pay

## 2023-06-14 ENCOUNTER — Other Ambulatory Visit: Payer: Self-pay

## 2023-06-22 ENCOUNTER — Encounter: Payer: Self-pay | Admitting: Cardiology

## 2023-06-22 ENCOUNTER — Ambulatory Visit: Payer: Medicare Other | Attending: Cardiology | Admitting: Cardiology

## 2023-06-22 VITALS — BP 144/76 | HR 74 | Resp 16 | Ht 65.0 in | Wt 207.0 lb

## 2023-06-22 DIAGNOSIS — Z955 Presence of coronary angioplasty implant and graft: Secondary | ICD-10-CM | POA: Insufficient documentation

## 2023-06-22 DIAGNOSIS — Z87891 Personal history of nicotine dependence: Secondary | ICD-10-CM | POA: Insufficient documentation

## 2023-06-22 DIAGNOSIS — I2510 Atherosclerotic heart disease of native coronary artery without angina pectoris: Secondary | ICD-10-CM | POA: Insufficient documentation

## 2023-06-22 DIAGNOSIS — E785 Hyperlipidemia, unspecified: Secondary | ICD-10-CM | POA: Diagnosis present

## 2023-06-22 DIAGNOSIS — I10 Essential (primary) hypertension: Secondary | ICD-10-CM | POA: Insufficient documentation

## 2023-06-22 DIAGNOSIS — G4730 Sleep apnea, unspecified: Secondary | ICD-10-CM | POA: Diagnosis not present

## 2023-06-22 DIAGNOSIS — E119 Type 2 diabetes mellitus without complications: Secondary | ICD-10-CM | POA: Diagnosis present

## 2023-06-22 DIAGNOSIS — E1169 Type 2 diabetes mellitus with other specified complication: Secondary | ICD-10-CM | POA: Insufficient documentation

## 2023-06-22 NOTE — Progress Notes (Signed)
 Cardiology Office Note:  .   Date:  06/22/2023  ID:  Karina Oneal, DOB 13-Aug-1955, MRN [id number] PCP:  Daisy Floro, MD  Former Cardiology Providers: Dr. Dietrich Pates [facility] Providers Cardiologist:  Tessa Lerner, DO , [facility] (established care 08/08/2019) Electrophysiologist:  None  Click to update primary MD,subspecialty MD or APP then REFRESH:1}    Chief Complaint  Patient presents with   Coronary artery disease involving native coronary artery of   Follow-up    History of Present Illness: .   Karina Oneal is a 68 y.o. Caucasian female whose past medical history and cardiovascular risk factors includes: CAD, s/p DES to the RCA in 1/13 and to pLAD in 11/2011, HTN, HLD, NIDDM2, COPD, obesity s/p lap band surgery (2011), postmenopausal female.   Patient is noted to have CAD dating back to 2013 when she had PCI to the LAD and RCA.  Symptoms been focusing on improving her modifiable cardiovascular risk factors for secondary prevention.  She presents today for 1 year follow-up visit.  Over the last 1 year she denies anginal chest pain or heart failure symptoms.  She is on Ozempic and has lost approximately 16 pounds since last office visit for which she is congratulated for as she continues through her weight loss journey.  Her hemoglobin A1c has improved to 5.7, according to the patient.  She will also was experiencing lightheaded and dizziness and felt that her blood pressure overcorrected and hydrochlorothiazide has been discontinued.  Review of Systems: .   Review of Systems  Constitutional: Positive for weight loss.  Cardiovascular:  Negative for chest pain, claudication, dyspnea on exertion, leg swelling, near-syncope, orthopnea, palpitations, paroxysmal nocturnal dyspnea and syncope.  Respiratory:  Negative for shortness of breath.     Studies Reviewed:   EKG: EKG Interpretation Date/Time:  Tuesday June 22 2023 14:52:42 EST Ventricular Rate:  72 PR  Interval:  144 QRS Duration:  84 QT Interval:  418 QTC Calculation: 457 R Axis:   33  Text Interpretation: Normal sinus rhythm Normal ECG When compared with ECG of 01-Sep-2014 07:13, No significant change was found Confirmed by Tessa Lerner [phone]) on 06/22/2023 3:34:20 PM  Echocardiogram: Echo May 31 2023 LVEF: 60 to 65% Diastolic Function: Indeterminant No significant valvular heart disease Estimated RAP 3 mmHg See report for additional details  Stress Testing: Lexiscan (Walking with mod Bruce)Tetrofosmin Stress Test  05/22/2020: Nondiagnostic ECG stress. Myocardial perfusion is normal. Overall LV systolic function is normal without regional wall motion abnormalities. Stress LV EF: 64%. No previous exam available for comparison. Low risk.  RADIOLOGY: NA  Labs:   External Labs: Collected: 07/19/2019 Creatinine 0.69 mg/dL. Lipid profile: Total cholesterol 274, triglycerides 163, HDL 56, LDL 188, non-HDL 218. Hemoglobin A1c: 7.0 TSH: 1.47       Latest Ref Rng & Units 08/23/2021    4:00 AM 08/22/2021    4:10 AM 08/21/2021    1:10 PM  CBC  WBC 4.0 - 10.5 K/uL 8.5  13.5  16.0   Hemoglobin 12.0 - 15.0 g/dL 46.9  62.9  52.8   Hematocrit 36.0 - 46.0 % 32.9  32.1  40.9   Platelets 150 - 400 K/uL 164  173  210        Latest Ref Rng & Units 05/31/2023    8:34 AM 05/28/2022   10:37 AM 08/22/2021    4:10 AM  BMP  Glucose 70 - 99 mg/dL 413  244  010   BUN 8 -  27 mg/dL 14  9  8    Creatinine 0.57 - 1.00 mg/dL 1.61  0.96  0.45   BUN/Creat Ratio 12 - 28 18  12     Sodium 134 - 144 mmol/L 138  140  132   Potassium 3.5 - 5.2 mmol/L 4.2  4.1  3.3   Chloride 96 - 106 mmol/L 101  100  103   CO2 20 - 29 mmol/L 24  25  24    Calcium 8.7 - 10.3 mg/dL 9.3  9.3  7.8       Latest Ref Rng & Units 05/31/2023    8:34 AM 05/28/2022   10:37 AM 08/22/2021    4:10 AM  CMP  Glucose 70 - 99 mg/dL 409  811  914   BUN 8 - 27 mg/dL 14  9  8    Creatinine 0.57 - 1.00 mg/dL 7.82  9.56  2.13   Sodium  134 - 144 mmol/L 138  140  132   Potassium 3.5 - 5.2 mmol/L 4.2  4.1  3.3   Chloride 96 - 106 mmol/L 101  100  103   CO2 20 - 29 mmol/L 24  25  24    Calcium 8.7 - 10.3 mg/dL 9.3  9.3  7.8   Total Protein 6.0 - 8.5 g/dL 6.5  6.5    Total Bilirubin 0.0 - 1.2 mg/dL 0.3  0.3    Alkaline Phos 44 - 121 IU/L 93  99    AST 0 - 40 IU/L 15  16    ALT 0 - 32 IU/L 12  18      Lab Results  Component Value Date   CHOL 173 05/31/2023   HDL 63 05/31/2023   LDLCALC 85 05/31/2023   LDLDIRECT 84 05/31/2023   TRIG 148 05/31/2023   CHOLHDL 3.9 09/19/2013   No results for input(s): "LIPOA" in the last 8760 hours. No components found for: "NTPROBNP" No results for input(s): "PROBNP" in the last 8760 hours. No results for input(s): "TSH" in the last 8760 hours.   Physical Exam:    Today's Vitals   06/22/23 1449  BP: (!) 144/76  Pulse: 74  Resp: 16  SpO2: 97%  Weight: 207 lb (93.9 kg)  Height: 5\' 5"  (1.651 m)   Body mass index is 34.45 kg/m. Wt Readings from Last 3 Encounters:  06/22/23 207 lb (93.9 kg)  06/11/22 223 lb (101.2 kg)  04/11/22 235 lb (106.6 kg)    Physical Exam  Constitutional: No distress.  Age appropriate, hemodynamically stable.   Neck: No JVD present.  Cardiovascular: Normal rate, regular rhythm, S1 normal, S2 normal, intact distal pulses and normal pulses. Exam reveals no gallop, no S3 and no S4.  No murmur heard. Pulmonary/Chest: Effort normal and breath sounds normal. No stridor. She has no wheezes. She has no rales.  Abdominal: Soft. Bowel sounds are normal. She exhibits no distension. There is no abdominal tenderness.  Musculoskeletal:        General: No edema.     Cervical back: Neck supple.  Neurological: She is alert and oriented to person, place, and time. She has intact cranial nerves (2-12).  Skin: Skin is warm and moist.     Impression & Recommendation(s):  Impression:   ICD-10-CM   1. Coronary artery disease involving native coronary artery of  native heart without angina pectoris  I25.10 EKG 12-Lead    2. History of coronary angioplasty with insertion of stent  Z95.5     3.  Sleep apnea in adult  G47.30     4. Non-insulin dependent type 2 diabetes mellitus (HCC)  E11.9     5. Type 2 diabetes mellitus with hyperlipidemia (HCC)  E11.69    E78.5     6. Former smoker  Z87.891        Recommendation(s):  Coronary artery disease involving native coronary artery of native heart without angina pectoris History of coronary angioplasty with insertion of stent Prior history of LAD and RCA stents back in 2013. Denies anginal chest pain. Has implemented lifestyle changes and also started Mounjaro-has lost 16 pounds last office visit Continue aspirin 81 mg p.o. daily Continue Zetia 10 mg p.o. daily. Continue Repatha Lipids have significantly improved LDL used to be 188 mg/dL now 84 mg/dL. Reemphasized the importance of secondary prevention with focus on improving her modifiable cardiovascular risk factors such as glycemic control, lipid management, blood pressure control, weight loss.  Non-insulin dependent type 2 diabetes mellitus (HCC) Hemoglobin A1c is well-controlled Currently on ARB, Zetia, PCSK9 inhibitors, Mounjaro  Type 2 diabetes mellitus with hyperlipidemia (HCC) Currently on Zetia 10 mg p.o. daily and Repatha. She denies myalgia or other side effects. Most recent lipids dated 05/31/2023, independently reviewed as noted above.  LDL is 84 mg/dL.  Cardiology is following.  Benign essential hypertension: Since last office visit hydrochlorothiazide has been discontinued due to soft blood pressures. Medications reconciled. If she continues to lose weight on current medical therapy she may eventually need to down titrate antihypertensive medications if becomes symptomatic.  On the contrary in the home blood pressures are not well-controlled <130 mmHg recommend restarting hydrochlorothiazide 12.5 mg p.o. daily.  If initiated she  will call us back and we will place 1 week BMP ordered.  Patient is going to start rechecking her home blood pressures and will call us back for further recommendations  Orders Placed:  Orders Placed This Encounter  Procedures   EKG 12-Lead   Final Medication List:   No orders of the defined types were placed in this encounter.   Medications Discontinued During This Encounter  Medication Reason   amLODipine (NORVASC) 10 MG tablet Duplicate   buPROPion (WELLBUTRIN XL) 150 MG 24 hr tablet Duplicate   losartan (COZAAR) 100 MG tablet Duplicate   losartan (COZAAR) 100 MG tablet Duplicate   losartan (COZAAR) 100 MG tablet Duplicate   tirzepatide (MOUNJARO) 2.5 MG/0.5ML Pen Duplicate   tirzepatide (MOUNJARO) 5 MG/0.5ML Pen Duplicate   traZODone (DESYREL) 100 MG tablet Duplicate   hydrochlorothiazide (HYDRODIURIL) 25 MG tablet Patient Preference   bisacodyl 5 MG EC tablet Patient Preference     Current Outpatient Medications:    Accu-Chek Softclix Lancets lancets, Test Blood sugar once daily, Disp: 100 each, Rfl: 5   albuterol (PROVENTIL HFA;VENTOLIN HFA) 108 (90 BASE) MCG/ACT inhaler, Inhale 2 puffs into the lungs every 6 (six) hours as needed for wheezing. , Disp: , Rfl:    amLODipine (NORVASC) 10 MG tablet, Take 1 tablet (10 mg total) by mouth daily as needed. May decrease if BP low., Disp: 90 tablet, Rfl: 4   amLODipine (NORVASC) 10 MG tablet, Take 1 tablet (10 mg total) by mouth daily., Disp: 90 tablet, Rfl: 4   Ascorbic Acid (VITAMIN C) 100 MG tablet, Take 100 mg by mouth daily., Disp: , Rfl:    aspirin 81 MG EC tablet, Take 81 mg by mouth daily., Disp: , Rfl:    Blood Glucose Monitoring Suppl (ACCU-CHEK GUIDE) w/Device KIT, Use to test blood sugar .  Once a day 100 days, Disp: 100 kit, Rfl: 0   buPROPion (WELLBUTRIN XL) 150 MG 24 hr tablet, Take 1 tablet (150 mg total) by mouth in the morning., Disp: 90 tablet, Rfl: 4   buPROPion (WELLBUTRIN XL) 150 MG 24 hr tablet, Take 1 tablet (150  mg total) by mouth in the morning., Disp: 90 tablet, Rfl: 4   carvedilol (COREG) 25 MG tablet, Take 1 tablet (25 mg total) by mouth 2 (two) times daily., Disp: 180 tablet, Rfl: 0   Cholecalciferol (VITAMIN D) 50 MCG (2000 UT) CAPS, Take 2,000 Units by mouth daily. , Disp: , Rfl:    Coenzyme Q10 (CO Q 10 PO), Take 1 tablet by mouth daily., Disp: , Rfl:    Evolocumab (REPATHA SURECLICK) 140 MG/ML SOAJ, Inject 140 mg into the skin every 14 (fourteen) days., Disp: 6 mL, Rfl: 1   ezetimibe (ZETIA) 10 MG tablet, Take 1 tablet (10 mg total) by mouth daily., Disp: 90 tablet, Rfl: 0   glucose blood (ACCU-CHEK GUIDE) test strip, Use to test blood sugar In Vitro Once a day 100 days, Disp: 100 strip, Rfl: 5   hydrALAZINE (APRESOLINE) 25 MG tablet, Take 1 tablet (25 mg total) by mouth 3 (three) times daily., Disp: 270 tablet, Rfl: 1   ibuprofen (ADVIL) 200 MG tablet, Take 200-400 mg by mouth every 6 (six) hours as needed for moderate pain., Disp: , Rfl:    losartan (COZAAR) 100 MG tablet, Take 1 tablet (100 mg total) by mouth daily., Disp: 90 tablet, Rfl: 4   nitroGLYCERIN (NITROSTAT) 0.4 MG SL tablet, Place 0.4 mg under the tongue every 5 (five) minutes as needed. For chest pain, Disp: , Rfl:    sertraline (ZOLOFT) 100 MG tablet, Take 1 tablet (100 mg total) by mouth daily., Disp: 90 tablet, Rfl: 4   tirzepatide (MOUNJARO) 12.5 MG/0.5ML Pen, Inject 12.5 mg into the skin every 7 (seven) days., Disp: 2 mL, Rfl: 6   traZODone (DESYREL) 100 MG tablet, Take 2 tablets (200 mg total) by mouth at bedtime., Disp: 180 tablet, Rfl: 4   tirzepatide (MOUNJARO) 10 MG/0.5ML Pen, Inject 10 mg into the skin once a week., Disp: 2 mL, Rfl: 3  Consent:   NA  Disposition:   1 year follow-up sooner if needed   Her questions and concerns were addressed to her satisfaction. She voices understanding of the recommendations provided during this encounter.    Signed, Tessa Lerner, DO, [facility] [place]  [facility]  [address] [place], [place] [address] 06/22/2023 6:05 PM

## 2023-06-22 NOTE — Patient Instructions (Signed)
 Medication Instructions:  Your physician recommends that you continue on your current medications as directed. Please refer to the Current Medication list given to you today.  *If you need a refill on your cardiac medications before your next appointment, please call your pharmacy*  Lab Work: None ordered today. If you have labs (blood work) drawn today and your tests are completely normal, you will receive your results only by: MyChart Message (if you have MyChart) OR A paper copy in the mail If you have any lab test that is abnormal or we need to change your treatment, we will call you to review the results.  Testing/Procedures: None ordered today.  Follow-Up: At [facility], you and your health needs are our priority.  As part of our continuing mission to provide you with exceptional heart care, we have created designated Provider Care Teams.  These Care Teams include your primary Cardiologist (physician) and Advanced Practice Providers (APPs -  Physician Assistants and Nurse Practitioners) who all work together to provide you with the care you need, when you need it.  We recommend signing up for the patient portal called "MyChart".  Sign up information is provided on this After Visit Summary.  MyChart is used to connect with patients for Virtual Visits (Telemedicine).  Patients are able to view lab/test results, encounter notes, upcoming appointments, etc.  Non-urgent messages can be sent to your provider as well.   To learn more about what you can do with MyChart, go to [email or web address].    Your next appointment:   1 year(s)  The format for your next appointment:   In Person  Provider:   Tessa Lerner, DO {

## 2023-07-07 ENCOUNTER — Other Ambulatory Visit: Payer: Self-pay

## 2023-07-07 ENCOUNTER — Other Ambulatory Visit (HOSPITAL_COMMUNITY): Payer: Self-pay

## 2023-07-29 ENCOUNTER — Encounter: Payer: Self-pay | Admitting: Acute Care

## 2023-07-30 ENCOUNTER — Other Ambulatory Visit (HOSPITAL_COMMUNITY): Payer: Self-pay

## 2023-07-30 ENCOUNTER — Other Ambulatory Visit: Payer: Self-pay

## 2023-08-26 ENCOUNTER — Other Ambulatory Visit: Payer: Self-pay | Admitting: Cardiology

## 2023-08-26 ENCOUNTER — Other Ambulatory Visit (HOSPITAL_COMMUNITY): Payer: Self-pay

## 2023-08-26 DIAGNOSIS — E782 Mixed hyperlipidemia: Secondary | ICD-10-CM

## 2023-08-27 ENCOUNTER — Other Ambulatory Visit: Payer: Self-pay

## 2023-08-27 ENCOUNTER — Other Ambulatory Visit (HOSPITAL_COMMUNITY): Payer: Self-pay

## 2023-08-27 MED ORDER — REPATHA SURECLICK 140 MG/ML ~~LOC~~ SOAJ
140.0000 mg | SUBCUTANEOUS | 3 refills | Status: AC
  Filled 2023-08-27: qty 6, 84d supply, fill #0
  Filled 2023-11-23: qty 6, 84d supply, fill #1
  Filled 2024-02-14: qty 6, 84d supply, fill #2
  Filled 2024-05-17: qty 6, 84d supply, fill #3

## 2023-09-23 ENCOUNTER — Other Ambulatory Visit: Payer: Self-pay

## 2023-09-23 ENCOUNTER — Other Ambulatory Visit (HOSPITAL_COMMUNITY): Payer: Self-pay

## 2023-09-23 MED ORDER — MOUNJARO 15 MG/0.5ML ~~LOC~~ SOAJ
15.0000 mg | SUBCUTANEOUS | 6 refills | Status: AC
  Filled 2023-09-23: qty 2, 28d supply, fill #0
  Filled 2023-10-20: qty 2, 28d supply, fill #1
  Filled 2023-11-23: qty 2, 28d supply, fill #2
  Filled 2023-12-23: qty 2, 28d supply, fill #3
  Filled 2024-01-24: qty 2, 28d supply, fill #4
  Filled 2024-02-17: qty 2, 28d supply, fill #5
  Filled 2024-03-16: qty 2, 28d supply, fill #6

## 2023-09-23 MED ORDER — SERTRALINE HCL 100 MG PO TABS
100.0000 mg | ORAL_TABLET | Freq: Every day | ORAL | 4 refills | Status: AC
  Filled 2023-09-23: qty 90, 90d supply, fill #0
  Filled 2023-12-10 – 2023-12-13 (×2): qty 90, 90d supply, fill #1
  Filled 2024-03-17: qty 90, 90d supply, fill #2
  Filled 2024-05-17: qty 90, 90d supply, fill #3

## 2023-09-23 MED ORDER — BUPROPION HCL ER (XL) 150 MG PO TB24
150.0000 mg | ORAL_TABLET | Freq: Every morning | ORAL | 4 refills | Status: AC
  Filled 2023-09-23: qty 90, 90d supply, fill #0
  Filled 2023-12-10 – 2024-03-17 (×3): qty 90, 90d supply, fill #1
  Filled 2024-05-17: qty 90, 90d supply, fill #2

## 2023-09-23 MED ORDER — LOSARTAN POTASSIUM 100 MG PO TABS
100.0000 mg | ORAL_TABLET | Freq: Every day | ORAL | 4 refills | Status: AC
  Filled 2023-09-23 – 2023-10-20 (×2): qty 90, 90d supply, fill #0
  Filled 2024-01-24: qty 90, 90d supply, fill #1
  Filled 2024-04-24: qty 90, 90d supply, fill #2
  Filled 2024-05-17: qty 90, 90d supply, fill #3

## 2023-09-23 MED ORDER — AMLODIPINE BESYLATE 10 MG PO TABS
10.0000 mg | ORAL_TABLET | Freq: Every day | ORAL | 4 refills | Status: AC
  Filled 2023-09-23: qty 90, 90d supply, fill #0
  Filled 2024-01-10: qty 90, 90d supply, fill #1
  Filled 2024-04-24: qty 90, 90d supply, fill #2
  Filled 2024-05-17: qty 90, 90d supply, fill #3

## 2023-09-23 MED ORDER — TRAZODONE HCL 100 MG PO TABS
200.0000 mg | ORAL_TABLET | Freq: Every day | ORAL | 4 refills | Status: AC
  Filled 2023-09-23 – 2023-09-27 (×2): qty 180, 90d supply, fill #0
  Filled 2024-01-10: qty 180, 90d supply, fill #1
  Filled 2024-05-17: qty 180, 90d supply, fill #2

## 2023-09-25 ENCOUNTER — Other Ambulatory Visit (HOSPITAL_BASED_OUTPATIENT_CLINIC_OR_DEPARTMENT_OTHER): Payer: Self-pay | Admitting: Family Medicine

## 2023-09-25 DIAGNOSIS — E2839 Other primary ovarian failure: Secondary | ICD-10-CM

## 2023-09-27 ENCOUNTER — Other Ambulatory Visit (HOSPITAL_COMMUNITY): Payer: Self-pay

## 2023-09-27 ENCOUNTER — Other Ambulatory Visit: Payer: Self-pay

## 2023-10-18 ENCOUNTER — Ambulatory Visit (HOSPITAL_BASED_OUTPATIENT_CLINIC_OR_DEPARTMENT_OTHER)
Admission: RE | Admit: 2023-10-18 | Discharge: 2023-10-18 | Disposition: A | Discharge status: Home / Self Care | Source: Ambulatory Visit | Attending: Family Medicine | Admitting: Family Medicine

## 2023-10-18 DIAGNOSIS — E2839 Other primary ovarian failure: Secondary | ICD-10-CM | POA: Insufficient documentation

## 2023-10-20 ENCOUNTER — Other Ambulatory Visit: Payer: Self-pay | Admitting: Cardiology

## 2023-10-20 ENCOUNTER — Other Ambulatory Visit (HOSPITAL_COMMUNITY): Payer: Self-pay

## 2023-10-20 ENCOUNTER — Other Ambulatory Visit: Payer: Self-pay

## 2023-10-20 DIAGNOSIS — I10 Essential (primary) hypertension: Secondary | ICD-10-CM

## 2023-10-20 DIAGNOSIS — I2510 Atherosclerotic heart disease of native coronary artery without angina pectoris: Secondary | ICD-10-CM

## 2023-10-20 DIAGNOSIS — E782 Mixed hyperlipidemia: Secondary | ICD-10-CM

## 2023-10-20 MED ORDER — EZETIMIBE 10 MG PO TABS
10.0000 mg | ORAL_TABLET | Freq: Every day | ORAL | 3 refills | Status: AC
  Filled 2023-10-20: qty 90, 90d supply, fill #0
  Filled 2024-01-10: qty 90, 90d supply, fill #1
  Filled 2024-04-24: qty 90, 90d supply, fill #2
  Filled 2024-05-17: qty 90, 90d supply, fill #3

## 2023-10-20 MED ORDER — HYDRALAZINE HCL 25 MG PO TABS
25.0000 mg | ORAL_TABLET | Freq: Three times a day (TID) | ORAL | 3 refills | Status: AC
  Filled 2023-10-20: qty 270, 90d supply, fill #0
  Filled 2024-01-24: qty 270, 90d supply, fill #1
  Filled 2024-04-24: qty 270, 90d supply, fill #2
  Filled 2024-05-17: qty 270, 90d supply, fill #3

## 2023-10-20 MED ORDER — CARVEDILOL 25 MG PO TABS
25.0000 mg | ORAL_TABLET | Freq: Two times a day (BID) | ORAL | 3 refills | Status: AC
  Filled 2023-10-20: qty 180, 90d supply, fill #0
  Filled 2024-01-10: qty 180, 90d supply, fill #1
  Filled 2024-04-24: qty 180, 90d supply, fill #2
  Filled 2024-05-17: qty 180, 90d supply, fill #3

## 2023-10-21 ENCOUNTER — Other Ambulatory Visit (HOSPITAL_COMMUNITY): Payer: Self-pay

## 2023-11-23 ENCOUNTER — Other Ambulatory Visit (HOSPITAL_COMMUNITY): Payer: Self-pay

## 2023-12-11 ENCOUNTER — Other Ambulatory Visit (HOSPITAL_COMMUNITY): Payer: Self-pay

## 2023-12-13 ENCOUNTER — Other Ambulatory Visit (HOSPITAL_COMMUNITY): Payer: Self-pay

## 2023-12-23 ENCOUNTER — Other Ambulatory Visit (HOSPITAL_COMMUNITY): Payer: Self-pay

## 2024-01-10 ENCOUNTER — Other Ambulatory Visit (HOSPITAL_COMMUNITY): Payer: Self-pay

## 2024-01-24 ENCOUNTER — Other Ambulatory Visit (HOSPITAL_COMMUNITY): Payer: Self-pay

## 2024-02-11 ENCOUNTER — Other Ambulatory Visit (HOSPITAL_BASED_OUTPATIENT_CLINIC_OR_DEPARTMENT_OTHER): Payer: Self-pay

## 2024-02-11 MED ORDER — FLUZONE HIGH-DOSE 0.5 ML IM SUSY
0.5000 mL | PREFILLED_SYRINGE | Freq: Once | INTRAMUSCULAR | 0 refills | Status: AC
  Filled 2024-02-11: qty 0.5, 1d supply, fill #0

## 2024-02-14 ENCOUNTER — Other Ambulatory Visit: Payer: Self-pay

## 2024-02-17 ENCOUNTER — Other Ambulatory Visit: Payer: Self-pay

## 2024-03-01 ENCOUNTER — Telehealth: Payer: Self-pay | Admitting: *Deleted

## 2024-03-01 ENCOUNTER — Encounter: Payer: Self-pay | Admitting: *Deleted

## 2024-03-01 NOTE — Telephone Encounter (Signed)
 Left VM for patient to call to schedule yearly lung screening CT. Last one done in 2021. Also sent Mychart letter to pt.

## 2024-03-16 ENCOUNTER — Other Ambulatory Visit (HOSPITAL_COMMUNITY): Payer: Self-pay

## 2024-03-17 ENCOUNTER — Other Ambulatory Visit (HOSPITAL_COMMUNITY): Payer: Self-pay

## 2024-04-11 ENCOUNTER — Other Ambulatory Visit (HOSPITAL_COMMUNITY): Payer: Self-pay

## 2024-04-11 MED ORDER — OZEMPIC (2 MG/DOSE) 8 MG/3ML ~~LOC~~ SOPN
2.0000 mg | PEN_INJECTOR | SUBCUTANEOUS | 2 refills | Status: AC
  Filled 2024-04-11: qty 9, 84d supply, fill #0
  Filled 2024-05-17 – 2024-05-23 (×2): qty 3, 28d supply, fill #1

## 2024-04-24 ENCOUNTER — Other Ambulatory Visit (HOSPITAL_COMMUNITY): Payer: Self-pay

## 2024-05-17 ENCOUNTER — Other Ambulatory Visit: Payer: Self-pay

## 2024-05-17 ENCOUNTER — Other Ambulatory Visit (HOSPITAL_BASED_OUTPATIENT_CLINIC_OR_DEPARTMENT_OTHER): Payer: Self-pay

## 2024-05-17 ENCOUNTER — Other Ambulatory Visit (HOSPITAL_COMMUNITY): Payer: Self-pay

## 2024-05-23 ENCOUNTER — Encounter (HOSPITAL_COMMUNITY): Payer: Self-pay | Admitting: Pharmacist

## 2024-05-23 ENCOUNTER — Other Ambulatory Visit (HOSPITAL_COMMUNITY): Payer: Self-pay

## 2024-05-24 ENCOUNTER — Other Ambulatory Visit (HOSPITAL_COMMUNITY): Payer: Self-pay

## 2024-05-24 ENCOUNTER — Other Ambulatory Visit: Payer: Self-pay
